# Patient Record
Sex: Female | Born: 1985 | Race: White | Hispanic: No | Marital: Married | Smoking: Never smoker
Health system: Southern US, Community
[De-identification: ages and names within clinical notes are randomized; demographics above are authoritative.]

## PROBLEM LIST (undated history)

## (undated) ENCOUNTER — Inpatient Hospital Stay (HOSPITAL_COMMUNITY): Payer: Self-pay

## (undated) DIAGNOSIS — R519 Headache, unspecified: Secondary | ICD-10-CM

## (undated) DIAGNOSIS — G540 Brachial plexus disorders: Secondary | ICD-10-CM

## (undated) DIAGNOSIS — K200 Eosinophilic esophagitis: Secondary | ICD-10-CM

## (undated) DIAGNOSIS — K219 Gastro-esophageal reflux disease without esophagitis: Secondary | ICD-10-CM

## (undated) DIAGNOSIS — G932 Benign intracranial hypertension: Secondary | ICD-10-CM

## (undated) DIAGNOSIS — Z8669 Personal history of other diseases of the nervous system and sense organs: Secondary | ICD-10-CM

## (undated) DIAGNOSIS — E039 Hypothyroidism, unspecified: Secondary | ICD-10-CM

## (undated) DIAGNOSIS — E559 Vitamin D deficiency, unspecified: Secondary | ICD-10-CM

## (undated) DIAGNOSIS — Z87898 Personal history of other specified conditions: Secondary | ICD-10-CM

## (undated) DIAGNOSIS — E282 Polycystic ovarian syndrome: Secondary | ICD-10-CM

## (undated) DIAGNOSIS — R51 Headache: Secondary | ICD-10-CM

## (undated) HISTORY — DX: Personal history of other diseases of the nervous system and sense organs: Z86.69

## (undated) HISTORY — DX: Eosinophilic esophagitis: K20.0

## (undated) HISTORY — DX: Headache, unspecified: R51.9

## (undated) HISTORY — DX: Gastro-esophageal reflux disease without esophagitis: K21.9

## (undated) HISTORY — DX: Personal history of other specified conditions: Z87.898

## (undated) HISTORY — PX: CARPAL TUNNEL RELEASE: SHX101

## (undated) HISTORY — DX: Vitamin D deficiency, unspecified: E55.9

## (undated) HISTORY — DX: Brachial plexus disorders: G54.0

## (undated) HISTORY — PX: UPPER GASTROINTESTINAL ENDOSCOPY: SHX188

## (undated) HISTORY — DX: Polycystic ovarian syndrome: E28.2

## (undated) HISTORY — DX: Benign intracranial hypertension: G93.2

## (undated) HISTORY — DX: Headache: R51

## (undated) HISTORY — PX: LAPAROSCOPIC GASTRIC SLEEVE RESECTION: SHX5895

## (undated) HISTORY — DX: Hypothyroidism, unspecified: E03.9

---
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
## 2011-12-13 ENCOUNTER — Ambulatory Visit: Payer: Self-pay

## 2012-01-11 ENCOUNTER — Other Ambulatory Visit: Payer: Self-pay | Admitting: Physician Assistant

## 2012-01-11 ENCOUNTER — Emergency Department: Payer: Self-pay | Admitting: Emergency Medicine

## 2012-01-11 LAB — CSF CELL CT + PROT + GLU PANEL
CSF Tube #: 3
Eosinophil: 0 %
Glucose, CSF: 46 mg/dL (ref 40–75)
Monocytes/Macrophages: 50 %
Neutrophils: 0 %
Other Cells: 0 %
Protein, CSF: 36 mg/dL (ref 15–45)

## 2012-01-11 LAB — URINALYSIS, COMPLETE
Bilirubin,UR: NEGATIVE
Blood: NEGATIVE
Glucose,UR: NEGATIVE mg/dL (ref 0–75)
Protein: 30
Squamous Epithelial: 6
WBC UR: 5 /HPF (ref 0–5)

## 2012-01-11 LAB — CBC WITH DIFFERENTIAL/PLATELET
Basophil %: 0.4 %
Eosinophil #: 0.1 10*3/uL (ref 0.0–0.7)
HGB: 12.8 g/dL (ref 12.0–16.0)
Lymphocyte %: 17.3 %
Monocyte #: 0.5 x10 3/mm (ref 0.2–0.9)
Monocyte %: 4.9 %
Platelet: 272 10*3/uL (ref 150–440)
RDW: 14.3 % (ref 11.5–14.5)
WBC: 9.7 10*3/uL (ref 3.6–11.0)

## 2012-01-11 LAB — COMPREHENSIVE METABOLIC PANEL
Albumin: 3.9 g/dL (ref 3.4–5.0)
Alkaline Phosphatase: 88 U/L (ref 50–136)
Bilirubin,Total: 0.8 mg/dL (ref 0.2–1.0)
Chloride: 106 mmol/L (ref 98–107)
Co2: 27 mmol/L (ref 21–32)
Creatinine: 0.83 mg/dL (ref 0.60–1.30)
EGFR (African American): >60
Osmolality: 273 (ref 275–301)
Sodium: 138 mmol/L (ref 136–145)

## 2012-01-11 LAB — TSH: Thyroid Stimulating Horm: 3.03 u[IU]/mL

## 2012-01-11 LAB — MAGNESIUM: Magnesium: 1.7 mg/dL — ABNORMAL LOW

## 2012-01-31 ENCOUNTER — Other Ambulatory Visit: Payer: Self-pay | Admitting: Neurology

## 2012-01-31 LAB — CBC WITH DIFFERENTIAL/PLATELET
Basophil #: 0 10*3/uL (ref 0.0–0.1)
Basophil %: 0.4 %
Lymphocyte #: 1.7 10*3/uL (ref 1.0–3.6)
Lymphocyte %: 23.5 %
MCHC: 32.9 g/dL (ref 32.0–36.0)
MCV: 85 fL (ref 80–100)
Monocyte #: 0.4 x10 3/mm (ref 0.2–0.9)
Neutrophil #: 5.1 10*3/uL (ref 1.4–6.5)
Neutrophil %: 69.2 %
RBC: 4.59 10*6/uL (ref 3.80–5.20)
RDW: 14.9 % — ABNORMAL HIGH (ref 11.5–14.5)

## 2012-01-31 LAB — COMPREHENSIVE METABOLIC PANEL
Albumin: 3.9 g/dL (ref 3.4–5.0)
Anion Gap: 8 (ref 7–16)
BUN: 9 mg/dL (ref 7–18)
Bilirubin,Total: 0.8 mg/dL (ref 0.2–1.0)
Chloride: 114 mmol/L — ABNORMAL HIGH (ref 98–107)
EGFR (African American): >60
EGFR (Non-African Amer.): >60
Glucose: 81 mg/dL (ref 65–99)
Potassium: 3.6 mmol/L (ref 3.5–5.1)
SGOT(AST): 24 U/L (ref 15–37)
Sodium: 140 mmol/L (ref 136–145)

## 2012-01-31 LAB — SEDIMENTATION RATE: Erythrocyte Sed Rate: 19 mm/hr (ref 0–20)

## 2012-02-09 ENCOUNTER — Ambulatory Visit: Payer: Self-pay | Admitting: Neurology

## 2012-02-28 ENCOUNTER — Ambulatory Visit: Payer: Self-pay | Admitting: Neurology

## 2012-03-18 ENCOUNTER — Ambulatory Visit: Payer: Self-pay | Admitting: Neurology

## 2012-03-22 ENCOUNTER — Ambulatory Visit: Payer: Self-pay | Admitting: Neurology

## 2012-05-26 ENCOUNTER — Other Ambulatory Visit: Payer: Self-pay | Admitting: Family Medicine

## 2012-05-26 LAB — BASIC METABOLIC PANEL
Anion Gap: 10 (ref 7–16)
BUN: 10 mg/dL (ref 7–18)
Chloride: 111 mmol/L — ABNORMAL HIGH (ref 98–107)
EGFR (African American): >60
EGFR (Non-African Amer.): >60
Glucose: 90 mg/dL (ref 65–99)
Osmolality: 278 (ref 275–301)
Potassium: 3.8 mmol/L (ref 3.5–5.1)

## 2012-05-26 LAB — HEMOGLOBIN A1C: Hemoglobin A1C: 5.1 % (ref 4.2–6.3)

## 2012-05-26 LAB — TSH: Thyroid Stimulating Horm: 4.05 u[IU]/mL

## 2012-08-25 ENCOUNTER — Ambulatory Visit: Payer: Self-pay | Admitting: Specialist

## 2012-08-25 LAB — CBC WITH DIFFERENTIAL/PLATELET
Basophil #: 0 10*3/uL (ref 0.0–0.1)
Basophil %: 0.6 %
Eosinophil %: 1.2 %
HCT: 37.7 % (ref 35.0–47.0)
HGB: 12.4 g/dL (ref 12.0–16.0)
Lymphocyte %: 26.7 %
MCH: 27.5 pg (ref 26.0–34.0)
MCHC: 33 g/dL (ref 32.0–36.0)
MCV: 83 fL (ref 80–100)
Neutrophil %: 65.7 %
Platelet: 253 10*3/uL (ref 150–440)
RBC: 4.52 10*6/uL (ref 3.80–5.20)

## 2012-08-25 LAB — COMPREHENSIVE METABOLIC PANEL
Alkaline Phosphatase: 79 U/L (ref 50–136)
BUN: 10 mg/dL (ref 7–18)
Bilirubin,Total: 0.5 mg/dL (ref 0.2–1.0)
Calcium, Total: 8.8 mg/dL (ref 8.5–10.1)
Chloride: 115 mmol/L — ABNORMAL HIGH (ref 98–107)
Co2: 21 mmol/L (ref 21–32)
EGFR (African American): >60
EGFR (Non-African Amer.): >60
Osmolality: 278 (ref 275–301)
SGOT(AST): 26 U/L (ref 15–37)
SGPT (ALT): 25 U/L (ref 12–78)
Sodium: 140 mmol/L (ref 136–145)
Total Protein: 7.6 g/dL (ref 6.4–8.2)

## 2012-08-25 LAB — PROTIME-INR
INR: 1
Prothrombin Time: 13.3 secs (ref 11.5–14.7)

## 2012-08-25 LAB — LIPASE, BLOOD: Lipase: 108 U/L (ref 73–393)

## 2012-08-25 LAB — TSH: Thyroid Stimulating Horm: 1.75 u[IU]/mL

## 2012-08-25 LAB — IRON AND TIBC
Iron Bind.Cap.(Total): 327 ug/dL (ref 250–450)
Iron Saturation: 16 %
Iron: 52 ug/dL (ref 50–170)
Unbound Iron-Bind.Cap.: 275 ug/dL

## 2012-08-25 LAB — FOLATE: Folic Acid: 7.9 ng/mL (ref 3.1–100.0)

## 2012-08-25 LAB — MAGNESIUM: Magnesium: 1.7 mg/dL — ABNORMAL LOW

## 2012-09-08 ENCOUNTER — Ambulatory Visit: Payer: Self-pay | Admitting: Specialist

## 2012-09-19 ENCOUNTER — Ambulatory Visit: Payer: Self-pay | Admitting: Specialist

## 2012-09-25 ENCOUNTER — Ambulatory Visit: Payer: Self-pay | Admitting: Internal Medicine

## 2012-09-27 ENCOUNTER — Encounter: Payer: Self-pay | Admitting: Internal Medicine

## 2012-09-27 ENCOUNTER — Ambulatory Visit (INDEPENDENT_AMBULATORY_CARE_PROVIDER_SITE_OTHER): Payer: 59 | Admitting: Internal Medicine

## 2012-09-27 VITALS — BP 110/70 | HR 61 | Temp 98.0°F | Ht 66.0 in | Wt 296.5 lb

## 2012-09-27 DIAGNOSIS — E039 Hypothyroidism, unspecified: Secondary | ICD-10-CM

## 2012-09-27 DIAGNOSIS — K219 Gastro-esophageal reflux disease without esophagitis: Secondary | ICD-10-CM

## 2012-09-27 DIAGNOSIS — E282 Polycystic ovarian syndrome: Secondary | ICD-10-CM

## 2012-09-27 DIAGNOSIS — G43909 Migraine, unspecified, not intractable, without status migrainosus: Secondary | ICD-10-CM

## 2012-09-27 DIAGNOSIS — G932 Benign intracranial hypertension: Secondary | ICD-10-CM

## 2012-09-30 ENCOUNTER — Encounter: Payer: Self-pay | Admitting: Internal Medicine

## 2012-09-30 DIAGNOSIS — E039 Hypothyroidism, unspecified: Secondary | ICD-10-CM | POA: Insufficient documentation

## 2012-09-30 DIAGNOSIS — K219 Gastro-esophageal reflux disease without esophagitis: Secondary | ICD-10-CM | POA: Insufficient documentation

## 2012-09-30 DIAGNOSIS — E282 Polycystic ovarian syndrome: Secondary | ICD-10-CM | POA: Insufficient documentation

## 2012-09-30 DIAGNOSIS — G43909 Migraine, unspecified, not intractable, without status migrainosus: Secondary | ICD-10-CM | POA: Insufficient documentation

## 2012-09-30 DIAGNOSIS — G932 Benign intracranial hypertension: Secondary | ICD-10-CM | POA: Insufficient documentation

## 2012-09-30 NOTE — Assessment & Plan Note (Signed)
Stable.  Follow.   

## 2012-09-30 NOTE — Progress Notes (Signed)
Subjective:    Patient ID: Anna Vasquez, female    DOB: 1985/08/20, 27 y.o.   MRN: [id number]  HPI 27 year old female with past history of pseudotumor cerebri followed by neruology, GERD, frequent headaches/migraines and hypothyroidism who comes in today to follow up on these issues as well as to establish care.  She states her headaches are better now.  May have 1-2/month.  She has not tolerated preventative meds in the past.  Will have visual auras and light sensitivity.  Pain localized on the top of her head. Takes Excedrin Migraine and this works for her.  States she has had migraines since she was very young.  Family history of migraines.  Was diagnosed with pseudotumor cerebri in 2009.  At that time was having tunnel vision and floaters.  Also had headache and emesis.  Was placed on Diamox at that time.  Was off for a while and did not f/u with neurology.  Now she is back seeing neurology and back on Diamox.  She does not tolerate the Diamox.  Wants to be off the medication.  She has discussed this with her neurology.  Desires weight loss.  Feels she may be able to get off the medication if she can lose weight.  Is seeing Dr Smitty Cords.  Planning for gastric sleeve - end of 8/14.  Has had EKG, labs, CXR, ultrasound, and CT (head and neck).  Planning for UGI.  Has her last nutrition visit 10/23/12.  Has her psych consultation 7/23.    She also has inconsistent periods.  Was diagnosed with PCOD.  Last pap 2012.  All have been normal.  Periods may be very heavy for two days and last for 4-5 days.  Has some acid reflux.  Not taking any medication regularly now for this.  She moved here from [place].  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     Past Medical History  Diagnosis Date  . GERD (gastroesophageal reflux disease)   . Frequent headaches   . Hypothyroidism   . H/O febrile seizure   . Hx of migraines   . Pseudotumor cerebri   . PCOD (polycystic ovarian disease)     Outpatient Encounter Prescriptions as of  09/27/2012  Medication Sig Dispense Refill  . acetaminophen (TYLENOL) 325 MG tablet Take 650 mg by mouth every 6 (six) hours as needed for pain.      Marland Kitchen acetaZOLAMIDE (DIAMOX) 500 MG capsule Take 500 mg by mouth 2 (two) times daily.      Marland Kitchen aspirin-acetaminophen-caffeine (EXCEDRIN MIGRAINE) 250-250-65 MG per tablet Take 1 tablet by mouth every 6 (six) hours as needed for pain.      . B Complex Vitamins (B COMPLEX-B12 PO) Take by mouth daily.      . Cholecalciferol (VITAMIN D3) 5000 UNITS CAPS Take by mouth daily.      . IRON, FERROUS GLUCONATE, PO Take 60 mg by mouth daily.      Marland Kitchen levothyroxine (SYNTHROID, LEVOTHROID) 112 MCG tablet Take 112 mcg by mouth daily before breakfast.      . magnesium gluconate (MAGONATE) 500 MG tablet Take 500 mg by mouth daily.       No facility-administered encounter medications on file as of 09/27/2012.    Review of Systems Has issues with headaches as outlined.  Currently doing better.  Excedrin Migraine works.  No chest pain, tightness or palpitations.  No increased shortness of breath, cough or congestion.  No nausea or vomiting.  Some acid reflux as outlined.  No abdominal pain or cramping.  No bowel change, such as diarrhea, constipation, BRBPR or melana.  No urine change.  Periods as outlined.  Desire for weight loss as outlined.        Objective:   Physical Exam Filed Vitals:   09/27/12 1333  BP: 110/70  Pulse: 61  Temp: 98 F (8.81 C)   27 year old female in no acute distress.   HEENT:  Nares- clear.  Oropharynx - without lesions. NECK:  Supple.  Nontender.  No audible bruit.  HEART:  Appears to be regular. LUNGS:  No crackles or wheezing audible.  Respirations even and unlabored.  RADIAL PULSE:  Equal bilaterally. ABDOMEN:  Soft, nontender.  Bowel sounds present and normal.  No audible abdominal bruit.  EXTREMITIES:  No increased edema present.  DP pulses palpable and equal bilaterally.      SKIN:  No rash.       Assessment & Plan:  HEALTH  MAINTENANCE.  Schedule a physical next visit.  Obtain outside records.    I spent 45 minutes with the patient and more than 50% of the time was spent in consultation regarding the above.

## 2012-09-30 NOTE — Assessment & Plan Note (Signed)
Sees neurology.  Currently stable.  Has not done well with preventative medications.  Excedrin Migraine works well.

## 2012-09-30 NOTE — Assessment & Plan Note (Signed)
On thyroid placement.  Follow tsh.   

## 2012-09-30 NOTE — Assessment & Plan Note (Signed)
Currently on Diamox.  Doing better.  Headaches better.  Continues to follow up with neurology.

## 2012-09-30 NOTE — Assessment & Plan Note (Signed)
Has reflux symptoms.  Start Zantac 150mg  q day.  Follow.  Will notify me if persistent symptoms.  Schedule a follow up soon to reassess.

## 2012-10-16 ENCOUNTER — Encounter: Payer: Self-pay | Admitting: Internal Medicine

## 2012-10-20 ENCOUNTER — Ambulatory Visit: Payer: Self-pay | Admitting: Specialist

## 2012-10-22 ENCOUNTER — Encounter: Payer: Self-pay | Admitting: Internal Medicine

## 2012-10-22 DIAGNOSIS — K219 Gastro-esophageal reflux disease without esophagitis: Secondary | ICD-10-CM

## 2012-10-22 DIAGNOSIS — E559 Vitamin D deficiency, unspecified: Secondary | ICD-10-CM

## 2012-10-24 ENCOUNTER — Ambulatory Visit: Payer: Self-pay | Admitting: Gastroenterology

## 2012-10-25 LAB — PATHOLOGY REPORT

## 2012-11-06 ENCOUNTER — Encounter: Payer: Self-pay | Admitting: Internal Medicine

## 2012-11-20 ENCOUNTER — Ambulatory Visit: Payer: Self-pay | Admitting: Specialist

## 2012-11-27 ENCOUNTER — Ambulatory Visit: Payer: Self-pay | Admitting: Specialist

## 2012-11-28 ENCOUNTER — Ambulatory Visit: Payer: 59 | Admitting: Internal Medicine

## 2012-12-04 ENCOUNTER — Inpatient Hospital Stay: Payer: Self-pay | Admitting: Specialist

## 2012-12-05 LAB — BASIC METABOLIC PANEL
Anion Gap: 6 — ABNORMAL LOW (ref 7–16)
BUN: 4 mg/dL — ABNORMAL LOW (ref 7–18)
Co2: 20 mmol/L — ABNORMAL LOW (ref 21–32)
Creatinine: 0.79 mg/dL (ref 0.60–1.30)
EGFR (African American): >60
EGFR (Non-African Amer.): >60
Glucose: 124 mg/dL — ABNORMAL HIGH (ref 65–99)
Osmolality: 270 (ref 275–301)
Potassium: 4.4 mmol/L (ref 3.5–5.1)
Sodium: 136 mmol/L (ref 136–145)

## 2012-12-05 LAB — CBC WITH DIFFERENTIAL/PLATELET
Eosinophil #: 0 10*3/uL (ref 0.0–0.7)
Eosinophil %: 0 %
HGB: 11.7 g/dL — ABNORMAL LOW (ref 12.0–16.0)
Lymphocyte #: 0.5 10*3/uL — ABNORMAL LOW (ref 1.0–3.6)
Monocyte %: 2.4 %
Neutrophil %: 93.3 %
Platelet: 258 10*3/uL (ref 150–440)
RBC: 4.2 10*6/uL (ref 3.80–5.20)
RDW: 14.8 % — ABNORMAL HIGH (ref 11.5–14.5)

## 2012-12-06 LAB — CBC WITH DIFFERENTIAL/PLATELET
Basophil #: 0 10*3/uL (ref 0.0–0.1)
HGB: 10.8 g/dL — ABNORMAL LOW (ref 12.0–16.0)
Lymphocyte #: 1.5 10*3/uL (ref 1.0–3.6)
Lymphocyte %: 12.7 %
MCH: 27.2 pg (ref 26.0–34.0)
MCHC: 32.3 g/dL (ref 32.0–36.0)
Monocyte #: 0.6 x10 3/mm (ref 0.2–0.9)
Monocyte %: 5.3 %
Neutrophil %: 81.9 %
RBC: 3.96 10*6/uL (ref 3.80–5.20)

## 2012-12-06 LAB — BASIC METABOLIC PANEL
Anion Gap: 10 (ref 7–16)
Co2: 17 mmol/L — ABNORMAL LOW (ref 21–32)
Creatinine: 0.64 mg/dL (ref 0.60–1.30)
EGFR (African American): >60
Osmolality: 276 (ref 275–301)
Sodium: 140 mmol/L (ref 136–145)

## 2012-12-06 LAB — ALBUMIN: Albumin: 3 g/dL — ABNORMAL LOW (ref 3.4–5.0)

## 2012-12-06 LAB — PHOSPHORUS: Phosphorus: 1.5 mg/dL — ABNORMAL LOW (ref 2.5–4.9)

## 2012-12-06 LAB — PATHOLOGY REPORT

## 2012-12-14 ENCOUNTER — Encounter: Payer: Self-pay | Admitting: Internal Medicine

## 2012-12-14 ENCOUNTER — Ambulatory Visit (INDEPENDENT_AMBULATORY_CARE_PROVIDER_SITE_OTHER): Payer: 59 | Admitting: Internal Medicine

## 2012-12-14 VITALS — BP 110/80 | HR 99 | Temp 97.8°F | Ht 66.0 in | Wt 268.5 lb

## 2012-12-14 DIAGNOSIS — G932 Benign intracranial hypertension: Secondary | ICD-10-CM

## 2012-12-14 DIAGNOSIS — E282 Polycystic ovarian syndrome: Secondary | ICD-10-CM

## 2012-12-14 DIAGNOSIS — G43909 Migraine, unspecified, not intractable, without status migrainosus: Secondary | ICD-10-CM

## 2012-12-14 DIAGNOSIS — E559 Vitamin D deficiency, unspecified: Secondary | ICD-10-CM

## 2012-12-14 DIAGNOSIS — E039 Hypothyroidism, unspecified: Secondary | ICD-10-CM

## 2012-12-14 DIAGNOSIS — K219 Gastro-esophageal reflux disease without esophagitis: Secondary | ICD-10-CM

## 2012-12-17 ENCOUNTER — Encounter: Payer: Self-pay | Admitting: Internal Medicine

## 2012-12-17 NOTE — Assessment & Plan Note (Signed)
Continue vitamin D supplements.  

## 2012-12-17 NOTE — Assessment & Plan Note (Signed)
On thyroid placement.  Follow tsh.   

## 2012-12-17 NOTE — Assessment & Plan Note (Signed)
Currently on Diamox.  Doing better.  Headaches better.  Continues to follow up with neurology.   

## 2012-12-17 NOTE — Assessment & Plan Note (Signed)
Sees neurology.  Currently stable.  Has not done well with preventative medications.  Headaches better.

## 2012-12-17 NOTE — Assessment & Plan Note (Signed)
Controlled on omeprazole.  Follow.  

## 2012-12-17 NOTE — Progress Notes (Signed)
  Subjective:    Patient ID: Anna Vasquez, female    DOB: 1985-12-31, 27 y.o.   MRN: [id number]  HPI 27 year old female with past history of pseudotumor cerebri followed by neruology, GERD, frequent headaches/migraines and hypothyroidism who comes in today for a scheduled follow up.  She states her headaches are better now.  Was diagnosed with pseudotumor cerebri in 2009.  On Diamox seeing neurology.  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  Is seeing Dr Smitty Cords.  Doing well s/p her procedure.  Staying hydrated.  Has f/u 10/7.  She is walking.  Feels better.  No headaches now.  Acid reflux controlled on omeprazole.      Past Medical History  Diagnosis Date  . GERD (gastroesophageal reflux disease)   . Frequent headaches   . Hypothyroidism   . H/O febrile seizure   . Hx of migraines   . Pseudotumor cerebri   . PCOD (polycystic ovarian disease)   . Hypothyroidism   . Thoracic outlet syndrome     extra cervical ribs  . Vitamin D deficiency     Outpatient Encounter Prescriptions as of 12/14/2012  Medication Sig Dispense Refill  . acetaminophen (TYLENOL) 325 MG tablet Take 650 mg by mouth every 6 (six) hours as needed for pain.      Marland Kitchen acetaZOLAMIDE (DIAMOX) 500 MG capsule Take 500 mg by mouth 2 (two) times daily.      Marland Kitchen levothyroxine (SYNTHROID, LEVOTHROID) 112 MCG tablet Take 112 mcg by mouth daily before breakfast.      . omeprazole (PRILOSEC OTC) 20 MG tablet Take 20 mg by mouth daily.      . B Complex Vitamins (B COMPLEX-B12 PO) Take by mouth daily.      . Cholecalciferol (VITAMIN D3) 5000 UNITS CAPS Take by mouth daily.      . IRON, FERROUS GLUCONATE, PO Take 60 mg by mouth daily.      . magnesium gluconate (MAGONATE) 500 MG tablet Take 500 mg by mouth daily.      . [DISCONTINUED] aspirin-acetaminophen-caffeine (EXCEDRIN MIGRAINE) 250-250-65 MG per tablet Take 1 tablet by mouth every 6 (six) hours as needed for pain.       No facility-administered encounter medications on file as of  12/14/2012.    Review of Systems Headaches better.  No lightheadedness or dizziness.  No chest pain or tightness.  No sob.  Breathing doing well.  No nausea or vomiting.  Acid reflux controlled.  On omeprazole.  No abdominal pain or cramping.  No bowel change.  No urinary symptoms.      Objective:   Physical Exam  Filed Vitals:   12/14/12 0932  BP: 110/80  Pulse: 99  Temp: 97.8 F (36.6 C)   Pulse 28  27 year old female in no acute distress.   HEENT:  Nares- clear.  Oropharynx - without lesions. NECK:  Supple.  Nontender.  No audible bruit.  HEART:  Appears to be regular. LUNGS:  No crackles or wheezing audible.  Respirations even and unlabored.  RADIAL PULSE:  Equal bilaterally. ABDOMEN:  Soft, nontender.  Bowel sounds present and normal.  No audible abdominal bruit.  EXTREMITIES:  No increased edema present.  DP pulses palpable and equal bilaterally.        Assessment & Plan:  HEALTH MAINTENANCE.  Schedule a physical next visit.

## 2012-12-17 NOTE — Assessment & Plan Note (Signed)
Stable.  Follow.   

## 2012-12-28 ENCOUNTER — Ambulatory Visit: Payer: Self-pay | Admitting: Specialist

## 2013-01-01 ENCOUNTER — Telehealth: Payer: Self-pay | Admitting: Internal Medicine

## 2013-01-01 NOTE — Telephone Encounter (Signed)
Synthroid 112 mcg.  Patient canNOT take generic, has to be Synthroid.  Pt calling for refill.  States bottle says no refills.  Says was previously prescribed by another provider.  [facility].

## 2013-01-02 ENCOUNTER — Other Ambulatory Visit: Payer: Self-pay | Admitting: *Deleted

## 2013-01-02 MED ORDER — LEVOTHYROXINE SODIUM 112 MCG PO TABS: 112.0000 ug | ORAL_TABLET | Freq: Every day | ORAL | Status: DC

## 2013-01-02 NOTE — Telephone Encounter (Signed)
Sent electronically 

## 2013-01-10 ENCOUNTER — Telehealth: Payer: Self-pay | Admitting: Internal Medicine

## 2013-01-10 DIAGNOSIS — L989 Disorder of the skin and subcutaneous tissue, unspecified: Secondary | ICD-10-CM

## 2013-01-10 NOTE — Telephone Encounter (Signed)
Order placed for dermatology referral.  

## 2013-01-10 NOTE — Telephone Encounter (Signed)
Bump on face that will not clear up, dark, discolored spot that has never surfaced x almost 2 months.  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  Would like to see if she could get a referral to Dermatology to have this spot checked out.  Had an appt with Dr. Lorin Picket recently (9/25) and forgot to mention this.

## 2013-01-20 ENCOUNTER — Ambulatory Visit: Payer: Self-pay | Admitting: Specialist

## 2013-03-01 IMAGING — CT CT HEAD WITHOUT CONTRAST
2 series · 16 of 30 positions shown, 20 images · non-contrast
Comparison: none

REASON FOR EXAM: intracranial htn, headache, blurry vision.
COMMENTS:

PROCEDURE:     CT  - CT HEAD WITHOUT CONTRAST  - January 11, 2012  [DATE]
RESULT:     Comparison:  None
TECHNIQUE: Multiple axial images from the foramen magnum to the vertex were
obtained without IV contrast.

[Series 2: without · axial · non-contrast · 0.41mm/px · z∈[-86,+34]mm · 13 of 28 slices shown, 17 images]
[im 2/28  brain]
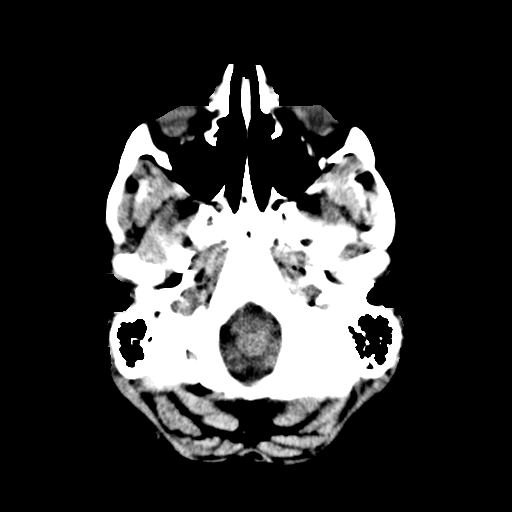
[im 2/28  bone]
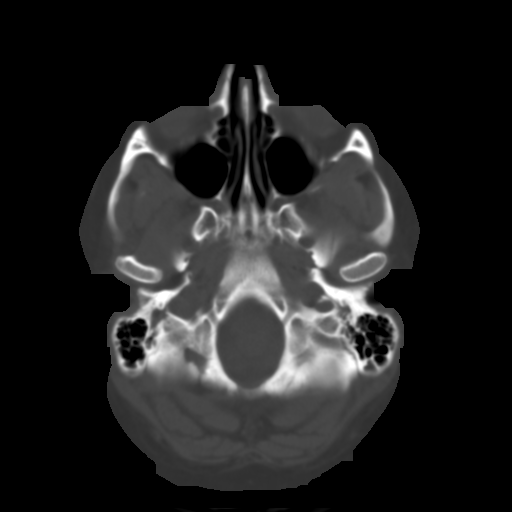
[im 4/28  brain]
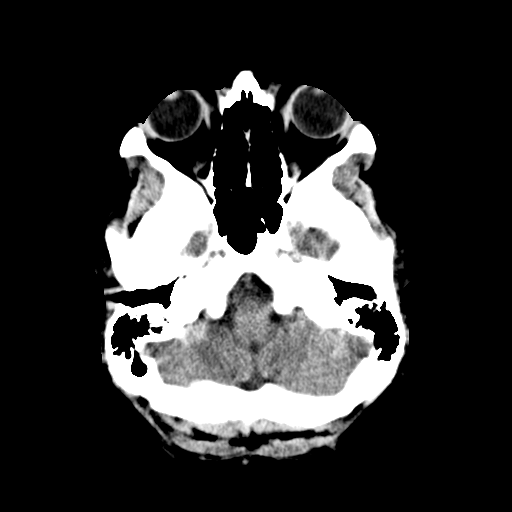
[im 6/28  brain]
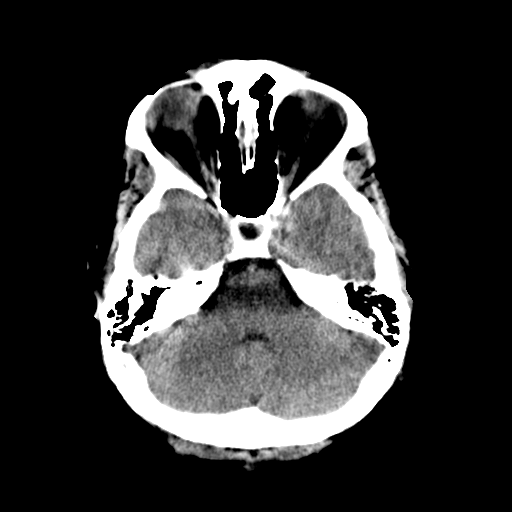
[im 8/28  brain]
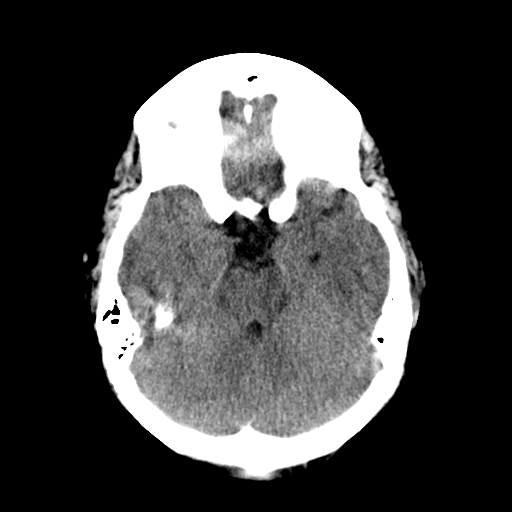
[im 10/28  brain]
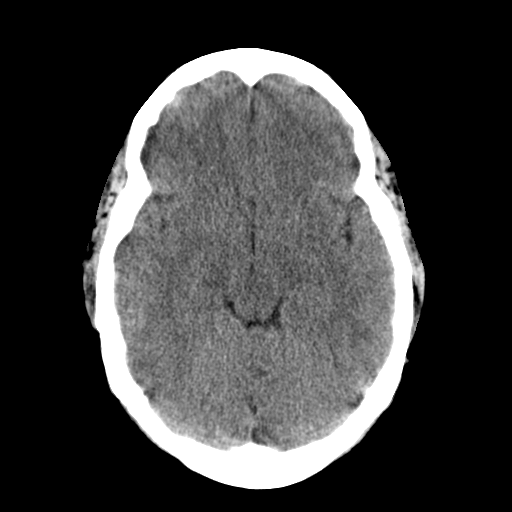
[im 10/28  bone]
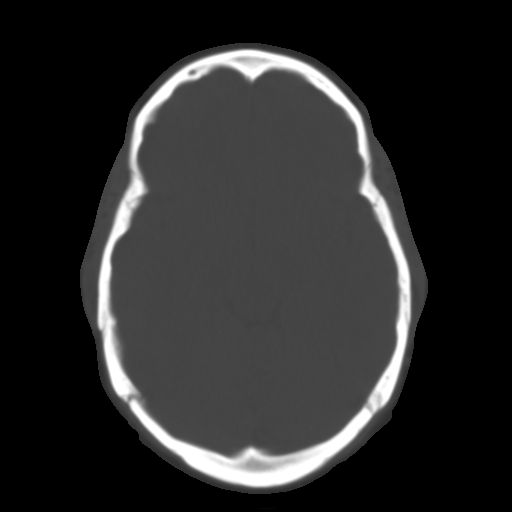
[im 12/28  brain]
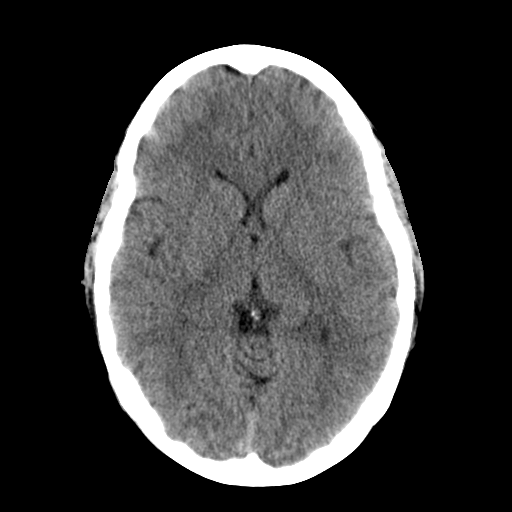
[im 14/28  brain]
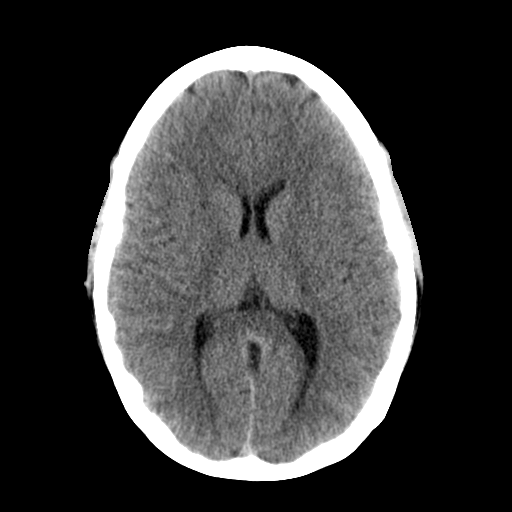
[im 16/28  brain]
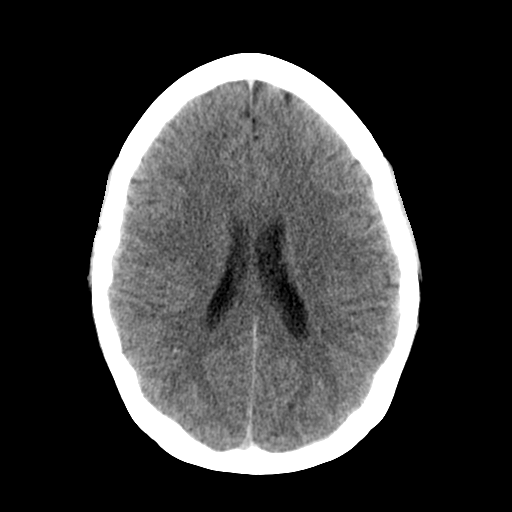
[im 18/28  brain]
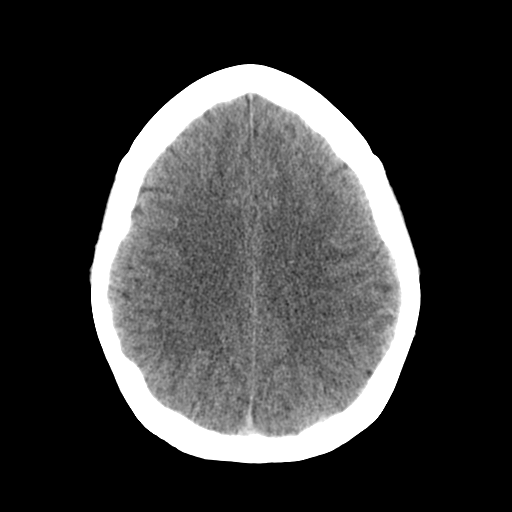
[im 18/28  bone]
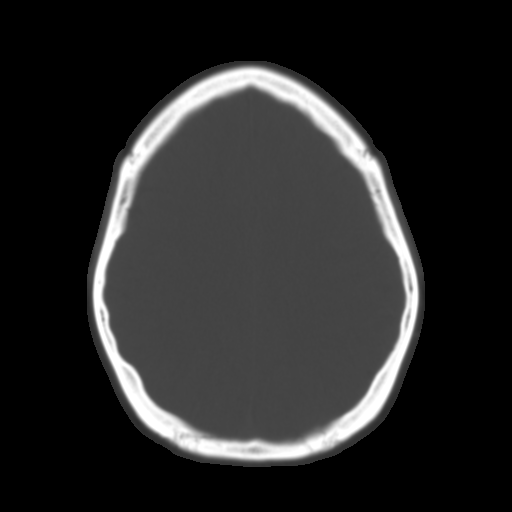
[im 20/28  brain]
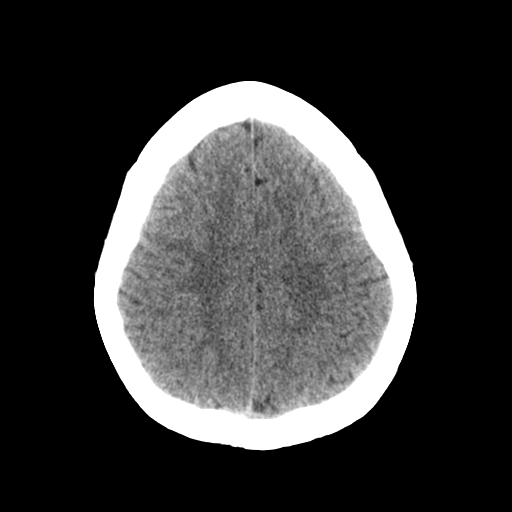
[im 22/28  brain]
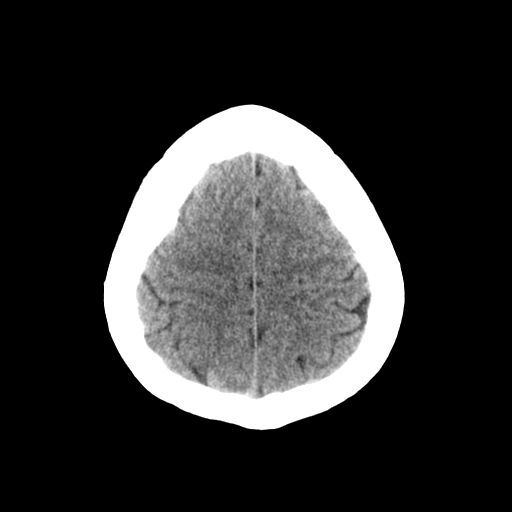
[im 24/28  brain]
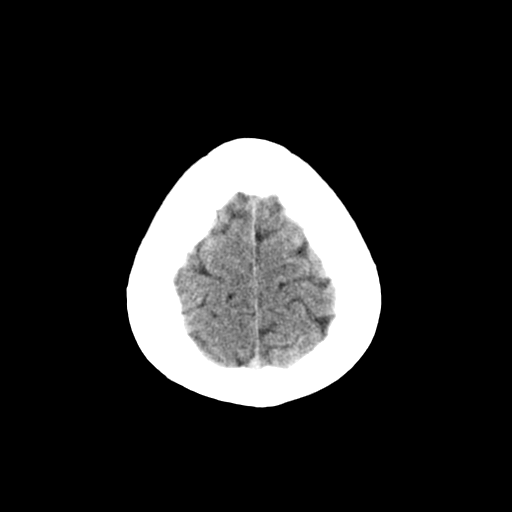
[im 26/28  brain]
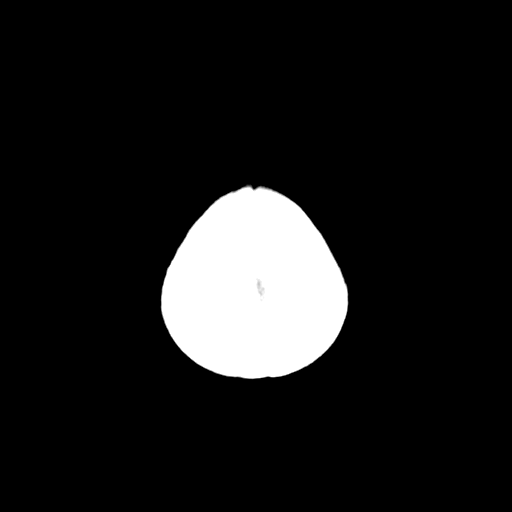
[im 26/28  bone]
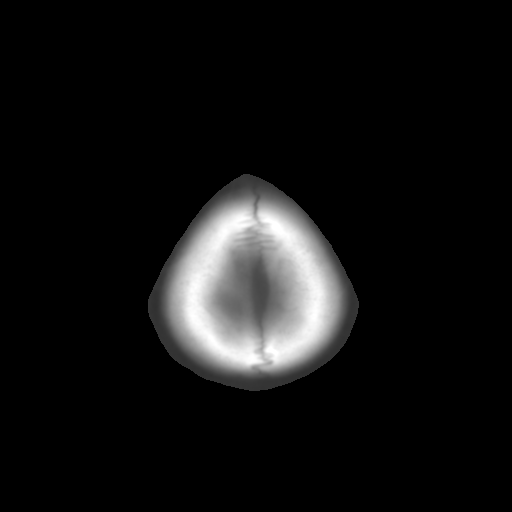

[Series 3: bone · axial · 0.41mm/px · z∈[-86,-46]mm · 3 of 28 slices shown]
[im 2/28  bone]
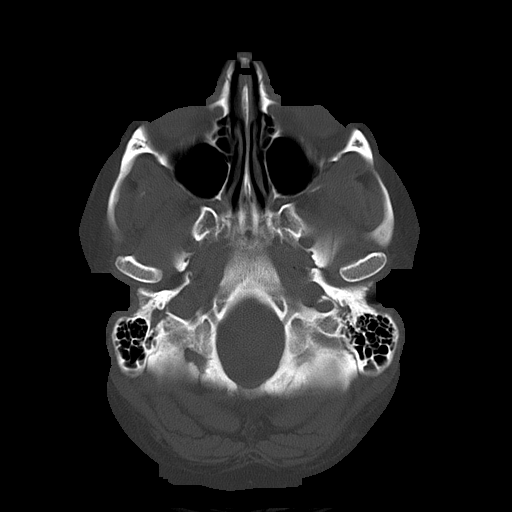
[im 6/28  bone]
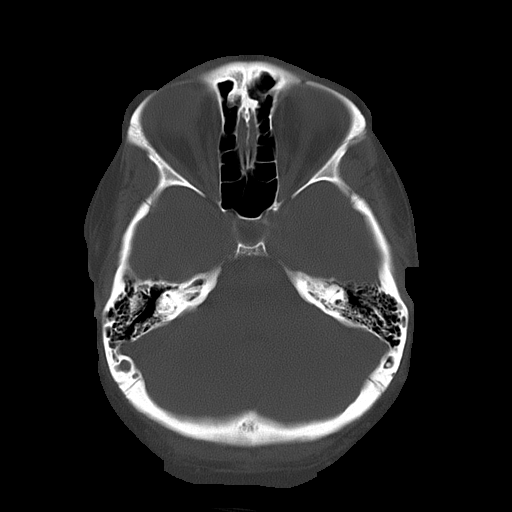
[im 10/28  bone]
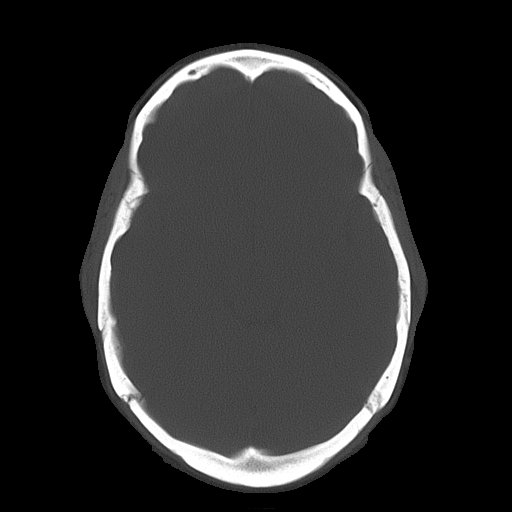

[16 of 30 positions shown; findings below may reference images not displayed]

FINDINGS: There is no evidence of mass effect, midline shift, or extra-axial fluid
collections.  There is no evidence of a space-occupying lesion or
intracranial hemorrhage. There is no evidence of a cortical-based area of
acute infarction.

The ventricles and sulci are appropriate for the patient's age. The basal
cisterns are patent.

Visualized portions of the orbits are unremarkable. The visualized portions
of the paranasal sinuses and mastoid air cells are unremarkable.

The osseous structures are unremarkable.
IMPRESSION: No acute intracranial process.

[REDACTED]

## 2013-03-09 ENCOUNTER — Other Ambulatory Visit (HOSPITAL_COMMUNITY)
Admission: RE | Admit: 2013-03-09 | Discharge: 2013-03-09 | Disposition: A | Discharge status: Home / Self Care | Payer: 59 | Source: Ambulatory Visit | Attending: Internal Medicine | Admitting: Internal Medicine

## 2013-03-09 ENCOUNTER — Encounter: Payer: Self-pay | Admitting: Internal Medicine

## 2013-03-09 ENCOUNTER — Ambulatory Visit (INDEPENDENT_AMBULATORY_CARE_PROVIDER_SITE_OTHER): Payer: 59 | Admitting: Internal Medicine

## 2013-03-09 VITALS — BP 110/70 | HR 60 | Temp 98.2°F | Ht 66.0 in | Wt 248.0 lb

## 2013-03-09 DIAGNOSIS — Z01419 Encounter for gynecological examination (general) (routine) without abnormal findings: Secondary | ICD-10-CM | POA: Insufficient documentation

## 2013-03-09 DIAGNOSIS — M25551 Pain in right hip: Secondary | ICD-10-CM

## 2013-03-09 DIAGNOSIS — K219 Gastro-esophageal reflux disease without esophagitis: Secondary | ICD-10-CM

## 2013-03-09 DIAGNOSIS — E039 Hypothyroidism, unspecified: Secondary | ICD-10-CM

## 2013-03-09 DIAGNOSIS — E282 Polycystic ovarian syndrome: Secondary | ICD-10-CM

## 2013-03-09 DIAGNOSIS — G43909 Migraine, unspecified, not intractable, without status migrainosus: Secondary | ICD-10-CM

## 2013-03-09 DIAGNOSIS — G932 Benign intracranial hypertension: Secondary | ICD-10-CM

## 2013-03-09 DIAGNOSIS — Z13220 Encounter for screening for lipoid disorders: Secondary | ICD-10-CM

## 2013-03-09 DIAGNOSIS — M25559 Pain in unspecified hip: Secondary | ICD-10-CM

## 2013-03-09 DIAGNOSIS — Z124 Encounter for screening for malignant neoplasm of cervix: Secondary | ICD-10-CM

## 2013-03-09 DIAGNOSIS — E559 Vitamin D deficiency, unspecified: Secondary | ICD-10-CM

## 2013-03-09 NOTE — Progress Notes (Signed)
Pre-visit discussion using our clinic review tool. No additional management support is needed unless otherwise documented below in the visit note.  

## 2013-03-11 ENCOUNTER — Encounter: Payer: Self-pay | Admitting: Internal Medicine

## 2013-03-11 DIAGNOSIS — M25551 Pain in right hip: Secondary | ICD-10-CM | POA: Insufficient documentation

## 2013-03-11 NOTE — Assessment & Plan Note (Signed)
On thyroid placement.  Follow tsh.   

## 2013-03-11 NOTE — Assessment & Plan Note (Signed)
Persistent pain.  Check xray.  Further w/up pending results.  

## 2013-03-11 NOTE — Assessment & Plan Note (Signed)
Doing better.  Headaches better.  Continues to follow up with neurology.  Off diamox.

## 2013-03-11 NOTE — Assessment & Plan Note (Signed)
Stable.  Follow.   

## 2013-03-11 NOTE — Assessment & Plan Note (Signed)
Sees neurology.  Currently stable.  Headaches better.

## 2013-03-11 NOTE — Assessment & Plan Note (Signed)
Continue vitamin D supplements.  

## 2013-03-11 NOTE — Progress Notes (Signed)
Subjective:    Patient ID: Anna Vasquez, female    DOB: [birth year], 27 y.o.   MRN: [id number]  HPI 27 year old female with past history of pseudotumor cerebri followed by neruology, GERD, frequent headaches/migraines and hypothyroidism who comes in today to follow up on these issues as well as for a complete physical exam.  She states her headaches are better now.  Was diagnosed with pseudotumor cerebri in 2009.  Off Diamox.  Seeing neurology. 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  Is seeing Dr Smitty Cords.  Doing well s/p her procedure.  Staying hydrated.  She is walking.  Feels better.  No headaches now.  Acid reflux controlled relatively well on omeprazole.  Still some occasional issues.  Some hip pain.  Right > left.  Persistent pain.  No injury.       Past Medical History  Diagnosis Date  . GERD (gastroesophageal reflux disease)   . Frequent headaches   . Hypothyroidism   . H/O febrile seizure   . Hx of migraines   . Pseudotumor cerebri   . PCOD (polycystic ovarian disease)   . Hypothyroidism   . Thoracic outlet syndrome     extra cervical ribs  . Vitamin D deficiency     Outpatient Encounter Prescriptions as of 03/09/2013  Medication Sig  . acetaminophen (TYLENOL) 325 MG tablet Take 650 mg by mouth every 6 (six) hours as needed for pain.  . B Complex Vitamins (B COMPLEX-B12 PO) Take by mouth daily.  . Cholecalciferol (VITAMIN D3) 5000 UNITS CAPS Take by mouth daily.  Marland Kitchen levothyroxine (SYNTHROID, LEVOTHROID) 112 MCG tablet Take 1 tablet (112 mcg total) by mouth daily before breakfast.  . magnesium gluconate (MAGONATE) 500 MG tablet Take 500 mg by mouth daily.  . Multiple Vitamin (MULTIVITAMIN) tablet Take 1 tablet by mouth daily.  Marland Kitchen omeprazole (PRILOSEC OTC) 20 MG tablet Take 20 mg by mouth daily.  . [DISCONTINUED] acetaZOLAMIDE (DIAMOX) 500 MG capsule Take 500 mg by mouth 2 (two) times daily.  . [DISCONTINUED] IRON, FERROUS GLUCONATE, PO Take 60 mg by mouth daily.    Review of  Systems Headaches better.  Off diamox.  No lightheadedness or dizziness.  No chest pain or tightness.  No sob.  Breathing doing well.  No nausea or vomiting.  Acid reflux controlled - on omeprazole.  No abdominal pain or cramping.  No bowel change.  No urinary symptoms.  Right hip pain greater than left.       Objective:   Physical Exam  Filed Vitals:   03/09/13 1615  BP: 110/70  Pulse: 60  Temp: 98.2 F (36.8 C)   Pulse 8  27 year old female in no acute distress.   HEENT:  Nares- clear.  Oropharynx - without lesions. NECK:  Supple.  Nontender.  No audible bruit.  HEART:  Appears to be regular. LUNGS:  No crackles or wheezing audible.  Respirations even and unlabored.  RADIAL PULSE:  Equal bilaterally.    BREASTS:  No nipple discharge or nipple retraction present.  Could not appreciate any distinct nodules or axillary adenopathy.  ABDOMEN:  Soft, nontender.  Bowel sounds present and normal.  No audible abdominal bruit.  GU:  Normal external genitalia.  Vaginal vault without lesions.  Cervix identified.  Pap performed. Could not appreciate any adnexal masses or tenderness.    EXTREMITIES:  No increased edema present.  DP pulses palpable and equal bilaterally.          Assessment & Plan:  HEALTH MAINTENANCE.  Physical today.  Check routine labs.

## 2013-03-11 NOTE — Assessment & Plan Note (Signed)
Controlled on omeprazole.  Follow.  

## 2013-03-13 ENCOUNTER — Other Ambulatory Visit (INDEPENDENT_AMBULATORY_CARE_PROVIDER_SITE_OTHER): Payer: 59

## 2013-03-13 ENCOUNTER — Ambulatory Visit: Payer: Self-pay | Admitting: Internal Medicine

## 2013-03-13 DIAGNOSIS — G932 Benign intracranial hypertension: Secondary | ICD-10-CM

## 2013-03-13 DIAGNOSIS — Z13220 Encounter for screening for lipoid disorders: Secondary | ICD-10-CM

## 2013-03-13 DIAGNOSIS — E039 Hypothyroidism, unspecified: Secondary | ICD-10-CM

## 2013-03-13 LAB — LIPID PANEL
Cholesterol: 144 mg/dL (ref 0–200)
HDL: 48.5 mg/dL (ref >39.00)
LDL Cholesterol: 80 mg/dL (ref 0–99)
Triglycerides: 77 mg/dL (ref 0.0–149.0)
VLDL: 15.4 mg/dL (ref 0.0–40.0)

## 2013-03-13 LAB — CBC WITH DIFFERENTIAL/PLATELET
Basophils Relative: 0.5 % (ref 0.0–3.0)
Eosinophils Absolute: 0.1 10*3/uL (ref 0.0–0.7)
Eosinophils Relative: 2.3 % (ref 0.0–5.0)
HCT: 37.8 % (ref 36.0–46.0)
Lymphs Abs: 1.3 10*3/uL (ref 0.7–4.0)
MCHC: 32.4 g/dL (ref 30.0–36.0)
MCV: 84.9 fl (ref 78.0–100.0)
Monocytes Absolute: 0.6 10*3/uL (ref 0.1–1.0)
Monocytes Relative: 9.6 % (ref 3.0–12.0)
Neutro Abs: 3.9 10*3/uL (ref 1.4–7.7)
Neutrophils Relative %: 65.5 % (ref 43.0–77.0)
RBC: 4.45 Mil/uL (ref 3.87–5.11)
WBC: 6 10*3/uL (ref 4.5–10.5)

## 2013-03-13 LAB — COMPREHENSIVE METABOLIC PANEL
AST: 22 U/L (ref 0–37)
Alkaline Phosphatase: 52 U/L (ref 39–117)
BUN: 10 mg/dL (ref 6–23)
CO2: 28 mEq/L (ref 19–32)
Creatinine, Ser: 0.7 mg/dL (ref 0.4–1.2)
GFR: 105.73 mL/min (ref >60.00)
Potassium: 4.3 mEq/L (ref 3.5–5.1)
Sodium: 139 mEq/L (ref 135–145)
Total Bilirubin: 0.8 mg/dL (ref 0.3–1.2)
Total Protein: 7.2 g/dL (ref 6.0–8.3)

## 2013-03-13 LAB — TSH: TSH: 2.49 u[IU]/mL (ref 0.35–5.50)

## 2013-03-14 ENCOUNTER — Encounter: Payer: Self-pay | Admitting: Internal Medicine

## 2013-03-18 ENCOUNTER — Telehealth: Payer: Self-pay | Admitting: Internal Medicine

## 2013-03-18 DIAGNOSIS — M25551 Pain in right hip: Secondary | ICD-10-CM

## 2013-03-18 NOTE — Telephone Encounter (Signed)
Pt notified of hip xray results.  Recommended referral to physical therapy.  She is to let me know.

## 2013-03-20 ENCOUNTER — Encounter: Payer: Self-pay | Admitting: Internal Medicine

## 2013-03-21 NOTE — Addendum Note (Signed)
Addended by: Charm Barges on: 03/21/2013 03:00 PM   Modules accepted: Orders

## 2013-03-21 NOTE — Telephone Encounter (Signed)
Order placed for referral.  

## 2013-04-09 ENCOUNTER — Encounter: Payer: Self-pay | Admitting: Internal Medicine

## 2013-04-13 ENCOUNTER — Encounter: Payer: Self-pay | Admitting: Internal Medicine

## 2013-04-22 ENCOUNTER — Encounter: Payer: Self-pay | Admitting: Internal Medicine

## 2013-04-23 ENCOUNTER — Other Ambulatory Visit: Payer: Self-pay | Admitting: Specialist

## 2013-04-23 LAB — CBC WITH DIFFERENTIAL/PLATELET
BASOS ABS: 0 10*3/uL (ref 0.0–0.1)
BASOS PCT: 0.5 %
Eosinophil #: 0.1 10*3/uL (ref 0.0–0.7)
Eosinophil %: 1.4 %
HCT: 38.6 % (ref 35.0–47.0)
HGB: 12.3 g/dL (ref 12.0–16.0)
Lymphocyte #: 2.1 10*3/uL (ref 1.0–3.6)
Lymphocyte %: 31.7 %
MCH: 27.5 pg (ref 26.0–34.0)
MCHC: 31.8 g/dL — ABNORMAL LOW (ref 32.0–36.0)
MCV: 86 fL (ref 80–100)
MONO ABS: 0.6 x10 3/mm (ref 0.2–0.9)
MONOS PCT: 9.2 %
NEUTROS PCT: 57.2 %
Neutrophil #: 3.8 10*3/uL (ref 1.4–6.5)
PLATELETS: 232 10*3/uL (ref 150–440)
RBC: 4.47 10*6/uL (ref 3.80–5.20)
RDW: 14.1 % (ref 11.5–14.5)
WBC: 6.6 10*3/uL (ref 3.6–11.0)

## 2013-04-23 LAB — IRON AND TIBC
IRON BIND. CAP.(TOTAL): 354 ug/dL (ref 250–450)
IRON: 56 ug/dL (ref 50–170)
Iron Saturation: 16 %
Unbound Iron-Bind.Cap.: 298 ug/dL

## 2013-04-23 LAB — AMYLASE: Amylase: 41 U/L (ref 25–115)

## 2013-04-23 LAB — COMPREHENSIVE METABOLIC PANEL
ANION GAP: 0 — AB (ref 7–16)
Albumin: 3.6 g/dL (ref 3.4–5.0)
Alkaline Phosphatase: 65 U/L
BUN: 10 mg/dL (ref 7–18)
Bilirubin,Total: 0.9 mg/dL (ref 0.2–1.0)
CO2: 27 mmol/L (ref 21–32)
Calcium, Total: 8.9 mg/dL (ref 8.5–10.1)
Chloride: 108 mmol/L — ABNORMAL HIGH (ref 98–107)
Creatinine: 0.7 mg/dL (ref 0.60–1.30)
EGFR (African American): >60
EGFR (Non-African Amer.): >60
Glucose: 80 mg/dL (ref 65–99)
OSMOLALITY: 268 (ref 275–301)
Potassium: 3.8 mmol/L (ref 3.5–5.1)
SGOT(AST): 20 U/L (ref 15–37)
SGPT (ALT): 18 U/L (ref 12–78)
Sodium: 135 mmol/L — ABNORMAL LOW (ref 136–145)
TOTAL PROTEIN: 7.5 g/dL (ref 6.4–8.2)

## 2013-04-23 LAB — PHOSPHORUS: Phosphorus: 2.4 mg/dL — ABNORMAL LOW (ref 2.5–4.9)

## 2013-04-23 LAB — MAGNESIUM: Magnesium: 1.6 mg/dL — ABNORMAL LOW

## 2013-04-23 LAB — FOLATE: FOLIC ACID: 13.8 ng/mL (ref 3.1–100.0)

## 2013-04-23 LAB — FERRITIN: FERRITIN (ARMC): 15 ng/mL (ref 8–388)

## 2013-05-12 ENCOUNTER — Emergency Department: Payer: Self-pay | Admitting: Emergency Medicine

## 2013-05-12 LAB — CBC WITH DIFFERENTIAL/PLATELET
BASOS PCT: 0.2 %
Basophil #: 0 10*3/uL (ref 0.0–0.1)
Eosinophil #: 0 10*3/uL (ref 0.0–0.7)
Eosinophil %: 0.3 %
HCT: 41.8 % (ref 35.0–47.0)
HGB: 13.7 g/dL (ref 12.0–16.0)
Lymphocyte #: 0.4 10*3/uL — ABNORMAL LOW (ref 1.0–3.6)
Lymphocyte %: 3.7 %
MCH: 28.6 pg (ref 26.0–34.0)
MCHC: 32.9 g/dL (ref 32.0–36.0)
MCV: 87 fL (ref 80–100)
Monocyte #: 0.4 x10 3/mm (ref 0.2–0.9)
Monocyte %: 3.3 %
Neutrophil #: 10.6 10*3/uL — ABNORMAL HIGH (ref 1.4–6.5)
Neutrophil %: 92.5 %
Platelet: 228 10*3/uL (ref 150–440)
RBC: 4.81 10*6/uL (ref 3.80–5.20)
RDW: 14.2 % (ref 11.5–14.5)
WBC: 11.4 10*3/uL — ABNORMAL HIGH (ref 3.6–11.0)

## 2013-05-12 LAB — COMPREHENSIVE METABOLIC PANEL
ALT: 23 U/L (ref 12–78)
AST: 27 U/L (ref 15–37)
Albumin: 3.9 g/dL (ref 3.4–5.0)
Alkaline Phosphatase: 78 U/L
Anion Gap: 3 — ABNORMAL LOW (ref 7–16)
BILIRUBIN TOTAL: 1.6 mg/dL — AB (ref 0.2–1.0)
BUN: 12 mg/dL (ref 7–18)
CO2: 28 mmol/L (ref 21–32)
CREATININE: 0.74 mg/dL (ref 0.60–1.30)
Calcium, Total: 8.8 mg/dL (ref 8.5–10.1)
Chloride: 105 mmol/L (ref 98–107)
Glucose: 98 mg/dL (ref 65–99)
Osmolality: 272 (ref 275–301)
Potassium: 3.8 mmol/L (ref 3.5–5.1)
Sodium: 136 mmol/L (ref 136–145)
Total Protein: 8 g/dL (ref 6.4–8.2)

## 2013-05-12 LAB — URINALYSIS, COMPLETE
BACTERIA: NONE SEEN
BILIRUBIN, UR: NEGATIVE
BLOOD: NEGATIVE
GLUCOSE, UR: NEGATIVE mg/dL (ref 0–75)
Leukocyte Esterase: NEGATIVE
NITRITE: NEGATIVE
PROTEIN: NEGATIVE
Ph: 5 (ref 4.5–8.0)
RBC,UR: 1 /HPF (ref 0–5)
SPECIFIC GRAVITY: 1.027 (ref 1.003–1.030)
Squamous Epithelial: 4

## 2013-05-12 LAB — LIPASE, BLOOD: Lipase: 108 U/L (ref 73–393)

## 2013-05-17 ENCOUNTER — Other Ambulatory Visit: Payer: Self-pay | Admitting: Internal Medicine

## 2013-09-04 ENCOUNTER — Ambulatory Visit: Payer: 59 | Admitting: Internal Medicine

## 2013-09-17 ENCOUNTER — Encounter: Payer: Self-pay | Admitting: Internal Medicine

## 2013-09-17 ENCOUNTER — Ambulatory Visit (INDEPENDENT_AMBULATORY_CARE_PROVIDER_SITE_OTHER): Payer: BC Managed Care – PPO | Admitting: Internal Medicine

## 2013-09-17 VITALS — BP 110/70 | HR 59 | Temp 98.2°F | Ht 66.0 in | Wt 225.8 lb

## 2013-09-17 DIAGNOSIS — G43809 Other migraine, not intractable, without status migrainosus: Secondary | ICD-10-CM

## 2013-09-17 DIAGNOSIS — G932 Benign intracranial hypertension: Secondary | ICD-10-CM

## 2013-09-17 DIAGNOSIS — K219 Gastro-esophageal reflux disease without esophagitis: Secondary | ICD-10-CM

## 2013-09-17 DIAGNOSIS — E282 Polycystic ovarian syndrome: Secondary | ICD-10-CM

## 2013-09-17 DIAGNOSIS — R011 Cardiac murmur, unspecified: Secondary | ICD-10-CM

## 2013-09-17 DIAGNOSIS — E039 Hypothyroidism, unspecified: Secondary | ICD-10-CM

## 2013-09-17 DIAGNOSIS — E559 Vitamin D deficiency, unspecified: Secondary | ICD-10-CM

## 2013-09-17 NOTE — Progress Notes (Signed)
Pre visit review using our clinic review tool, if applicable. No additional management support is needed unless otherwise documented below in the visit note. 

## 2013-09-21 ENCOUNTER — Encounter: Payer: Self-pay | Admitting: Internal Medicine

## 2013-09-21 DIAGNOSIS — R011 Cardiac murmur, unspecified: Secondary | ICD-10-CM | POA: Insufficient documentation

## 2013-09-21 NOTE — Assessment & Plan Note (Signed)
Controlled on omeprazole.  Follow.  

## 2013-09-21 NOTE — Assessment & Plan Note (Signed)
New murmur.  Check ECHO.

## 2013-09-21 NOTE — Assessment & Plan Note (Signed)
On thyroid placement.  Follow tsh.   

## 2013-09-21 NOTE — Progress Notes (Signed)
  Subjective:    Patient ID: Anna Vasquez, female    DOB: 01/09/1986, 28 y.o.   MRN: [id number]  HPI 28 year old female with past history of pseudotumor cerebri followed by neruology, GERD, frequent headaches/migraines and hypothyroidism who comes in today for a scheduled follow up.  She states her headaches are better now.  No headaches recently.   Was diagnosed with pseudotumor cerebri in 2009.  Off Diamox.  Seeing neurology. 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   Doing well s/p her procedure.  Staying hydrated.  She is walking.  Feels better.  Has lost weight.  Acid reflux controlled relatively well on omeprazole.       Past Medical History  Diagnosis Date  . GERD (gastroesophageal reflux disease)   . Frequent headaches   . Hypothyroidism   . H/O febrile seizure   . Hx of migraines   . Pseudotumor cerebri   . PCOD (polycystic ovarian disease)   . Hypothyroidism   . Thoracic outlet syndrome     extra cervical ribs  . Vitamin D deficiency     Outpatient Encounter Prescriptions as of 09/17/2013  Medication Sig  . acetaminophen (TYLENOL) 325 MG tablet Take 650 mg by mouth every 6 (six) hours as needed for pain.  . B Complex Vitamins (B COMPLEX-B12 PO) Take by mouth daily.  . Cholecalciferol (VITAMIN D3) 5000 UNITS CAPS Take by mouth daily.  . IRON PO Take by mouth.  . magnesium gluconate (MAGONATE) 500 MG tablet Take 500 mg by mouth daily.  . Multiple Vitamin (MULTIVITAMIN) tablet Take 1 tablet by mouth daily.  Marland Kitchen. omeprazole (PRILOSEC OTC) 20 MG tablet Take 20 mg by mouth daily.  Marland Kitchen. SYNTHROID 112 MCG tablet Take 1 tablet by mouth daily before breakfast.    Review of Systems No headaches now.  No lightheadedness or dizziness.  No chest pain or tightness.  No sob.  Breathing doing well.  No nausea or vomiting.  Acid reflux controlled - on omeprazole.  No abdominal pain or cramping.  No bowel change.  No urinary symptoms.  Has lost weight.  Feels better.        Objective:   Physical  Exam  Filed Vitals:   09/17/13 1336  BP: 110/70  Pulse: 59  Temp: 98.2 F (5936.738 C)   28 year old female in no acute distress.   HEENT:  Nares- clear.  Oropharynx - without lesions. NECK:  Supple.  Nontender.  No audible bruit.  HEART:  Appears to be regular.  I/VI systolic murmur.   LUNGS:  No crackles or wheezing audible.  Respirations even and unlabored.  RADIAL PULSE:  Equal bilaterally.   ABDOMEN:  Soft, nontender.  Bowel sounds present and normal.  No audible abdominal bruit.   EXTREMITIES:  No increased edema present.  DP pulses palpable and equal bilaterally.          Assessment & Plan:  HEALTH MAINTENANCE.  Physical last visit.

## 2013-09-21 NOTE — Assessment & Plan Note (Signed)
Sees neurology.  Currently stable.  No headaches recently.

## 2013-09-21 NOTE — Assessment & Plan Note (Signed)
Doing better.  No headaches recently.

## 2013-09-21 NOTE — Assessment & Plan Note (Signed)
Continue vitamin D supplements.  

## 2013-09-21 NOTE — Assessment & Plan Note (Signed)
Stable.  Follow.   

## 2013-10-12 ENCOUNTER — Other Ambulatory Visit (INDEPENDENT_AMBULATORY_CARE_PROVIDER_SITE_OTHER): Payer: BC Managed Care – PPO

## 2013-10-12 ENCOUNTER — Other Ambulatory Visit: Payer: Self-pay

## 2013-10-12 DIAGNOSIS — R011 Cardiac murmur, unspecified: Secondary | ICD-10-CM

## 2013-10-13 ENCOUNTER — Encounter: Payer: Self-pay | Admitting: Internal Medicine

## 2013-12-07 ENCOUNTER — Ambulatory Visit (INDEPENDENT_AMBULATORY_CARE_PROVIDER_SITE_OTHER): Payer: BC Managed Care – PPO | Admitting: *Deleted

## 2013-12-07 DIAGNOSIS — Z23 Encounter for immunization: Secondary | ICD-10-CM

## 2014-03-01 ENCOUNTER — Other Ambulatory Visit: Payer: Self-pay | Admitting: Internal Medicine

## 2014-03-19 ENCOUNTER — Ambulatory Visit (INDEPENDENT_AMBULATORY_CARE_PROVIDER_SITE_OTHER): Payer: BC Managed Care – PPO | Admitting: Internal Medicine

## 2014-03-19 ENCOUNTER — Encounter: Payer: Self-pay | Admitting: Internal Medicine

## 2014-03-19 VITALS — BP 118/80 | HR 66 | Temp 98.1°F | Ht 65.5 in | Wt 217.8 lb

## 2014-03-19 DIAGNOSIS — E559 Vitamin D deficiency, unspecified: Secondary | ICD-10-CM

## 2014-03-19 DIAGNOSIS — Z9889 Other specified postprocedural states: Secondary | ICD-10-CM

## 2014-03-19 DIAGNOSIS — E039 Hypothyroidism, unspecified: Secondary | ICD-10-CM

## 2014-03-19 DIAGNOSIS — G932 Benign intracranial hypertension: Secondary | ICD-10-CM

## 2014-03-19 DIAGNOSIS — M2550 Pain in unspecified joint: Secondary | ICD-10-CM | POA: Insufficient documentation

## 2014-03-19 DIAGNOSIS — E669 Obesity, unspecified: Secondary | ICD-10-CM

## 2014-03-19 DIAGNOSIS — G43809 Other migraine, not intractable, without status migrainosus: Secondary | ICD-10-CM

## 2014-03-19 DIAGNOSIS — K219 Gastro-esophageal reflux disease without esophagitis: Secondary | ICD-10-CM

## 2014-03-19 LAB — RHEUMATOID FACTOR

## 2014-03-19 NOTE — Progress Notes (Signed)
Pre visit review using our clinic review tool, if applicable. No additional management support is needed unless otherwise documented below in the visit note. 

## 2014-03-20 ENCOUNTER — Encounter: Payer: Self-pay | Admitting: Internal Medicine

## 2014-03-20 LAB — ANA: Anti Nuclear Antibody(ANA): NEGATIVE

## 2014-03-20 LAB — CBC WITH DIFFERENTIAL/PLATELET
BASOS PCT: 0.4 % (ref 0.0–3.0)
Basophils Absolute: 0 10*3/uL (ref 0.0–0.1)
Eosinophils Absolute: 0.1 10*3/uL (ref 0.0–0.7)
Eosinophils Relative: 1.1 % (ref 0.0–5.0)
HCT: 38.7 % (ref 36.0–46.0)
HEMOGLOBIN: 12.5 g/dL (ref 12.0–15.0)
Lymphocytes Relative: 21.7 % (ref 12.0–46.0)
Lymphs Abs: 2.4 10*3/uL (ref 0.7–4.0)
MCHC: 32.2 g/dL (ref 30.0–36.0)
MCV: 89.6 fl (ref 78.0–100.0)
MONOS PCT: 4.6 % (ref 3.0–12.0)
Monocytes Absolute: 0.5 10*3/uL (ref 0.1–1.0)
NEUTROS ABS: 8 10*3/uL — AB (ref 1.4–7.7)
Neutrophils Relative %: 72.2 % (ref 43.0–77.0)
Platelets: 220 10*3/uL (ref 150.0–400.0)
RBC: 4.32 Mil/uL (ref 3.87–5.11)
RDW: 13.3 % (ref 11.5–15.5)
WBC: 11.1 10*3/uL — ABNORMAL HIGH (ref 4.0–10.5)

## 2014-03-20 LAB — VITAMIN B12: VITAMIN B 12: 216 pg/mL (ref 211–911)

## 2014-03-20 LAB — TSH: TSH: 1.86 u[IU]/mL (ref 0.35–4.50)

## 2014-03-20 LAB — SEDIMENTATION RATE: Sed Rate: 16 mm/hr (ref 0–22)

## 2014-03-20 LAB — C-REACTIVE PROTEIN: CRP: <0.5 mg/dL (ref 0.5–20.0)

## 2014-03-21 ENCOUNTER — Other Ambulatory Visit: Payer: BC Managed Care – PPO

## 2014-03-21 ENCOUNTER — Other Ambulatory Visit: Payer: Self-pay | Admitting: Internal Medicine

## 2014-03-21 DIAGNOSIS — M25529 Pain in unspecified elbow: Secondary | ICD-10-CM

## 2014-03-23 ENCOUNTER — Encounter: Payer: Self-pay | Admitting: Internal Medicine

## 2014-03-23 DIAGNOSIS — Z6834 Body mass index (BMI) 34.0-34.9, adult: Secondary | ICD-10-CM | POA: Insufficient documentation

## 2014-03-23 NOTE — Progress Notes (Signed)
Subjective:    Patient ID: Anna Vasquez, female    DOB: 1985/09/16, 29 y.o.   MRN: [id number]  HPI 29 year old female with past history of pseudotumor cerebri followed by neruology, GERD, frequent headaches/migraines and hypothyroidism who comes in today to follow up on these issues as well as for a complete physical exam.   She states her headaches are better now.  No headaches reported.  Was diagnosed with pseudotumor cerebri in 2009.  Off Diamox.  Seeing neurology. 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   Doing well s/p her procedure.  Staying hydrated.  She is walking.  Feels better.  Has lost weight.  Acid reflux controlled on no medication.  Her main complaint is that of bilateral hip and kinee pain.  Worse after she has been sitting or lying for a while (and then goes to get up).  Does not bother her when she is exercising.  Does bother her when she stands a lot.  Has been taking ibuprofen.       Past Medical History  Diagnosis Date  . GERD (gastroesophageal reflux disease)   . Frequent headaches   . Hypothyroidism   . H/O febrile seizure   . Hx of migraines   . Pseudotumor cerebri   . PCOD (polycystic ovarian disease)   . Hypothyroidism   . Thoracic outlet syndrome     extra cervical ribs  . Vitamin D deficiency     Outpatient Encounter Prescriptions as of 03/19/2014  Medication Sig  . acetaminophen (TYLENOL) 325 MG tablet Take 650 mg by mouth every 6 (six) hours as needed for pain.  Marland Kitchen SYNTHROID 112 MCG tablet TAKE 1 TABLET BY MOUTH EVERY DAY  . B Complex Vitamins (B COMPLEX-B12 PO) Take by mouth daily.  . Cholecalciferol (VITAMIN D3) 5000 UNITS CAPS Take by mouth daily.  . IRON PO Take by mouth.  . magnesium gluconate (MAGONATE) 500 MG tablet Take 500 mg by mouth daily.  . Multiple Vitamin (MULTIVITAMIN) tablet Take 1 tablet by mouth daily.  Marland Kitchen omeprazole (PRILOSEC OTC) 20 MG tablet Take 20 mg by mouth daily.    Review of Systems No headaches reported now.  No lightheadedness  or dizziness.  No chest pain or tightness.  No sob.  Breathing doing well.  No nausea or vomiting.  Acid reflux controlled.  Off omeprazole.  No abdominal pain or cramping.  No bowel change.  No urinary symptoms.  Has lost weight.  Feels better.   Joint pain as outlined.  Taking ibuprofen.       Objective:   Physical Exam  Filed Vitals:   03/19/14 1542  BP: 118/80  Pulse: 66  Temp: 98.1 F (36.7 C)   Blood pressure recheck:  114/68, pulse 3  29 year old female in no acute distress.   HEENT:  Nares- clear.  Oropharynx - without lesions. NECK:  Supple.  Nontender.  No audible bruit.  HEART:  Appears to be regular. LUNGS:  No crackles or wheezing audible.  Respirations even and unlabored.  RADIAL PULSE:  Equal bilaterally.    BREASTS:  No nipple discharge or nipple retraction present.  Could not appreciate any distinct nodules or axillary adenopathy.  ABDOMEN:  Soft, nontender.  Bowel sounds present and normal.  No audible abdominal bruit.  GU:  Not performed.    EXTREMITIES:  No increased edema present.  DP pulses palpable and equal bilaterally.          Assessment & Plan:  1.  Hypothyroidism, unspecified hypothyroidism type On synthroid.  Follow tsh.  - TSH  2. Joint pain Bilateral hip and knee pain.  See above.  Better with exercise.  Family history of rheumatoid arthritis.  Check ESR and rheum panel.  Further w/up pening.   - Sedimentation rate - C-reactive protein - Rheumatoid factor - ANA - CBC with Differential  3. S/P gastric surgery Doing well s/p surgery.  Is watching diet and exercising.  Losing weight.  Will check B12 level given history of gastric surgery.   - Vitamin B12  4. Obesity (BMI 30-39.9) She is losing weight.  Is s/p gastric sleeve.  Has adjusted her diet and is losing weight.    5. Pseudotumor cerebri Doing better.  No headaches recently.    6. Other migraine without status migrainosus, not intractable No headaches reported.    7.  Gastroesophageal reflux disease, esophagitis presence not specified Now that she has adjusted her diet and lost weight, no reflux.  On no medication.    8. Vitamin D deficiency Vitamin D supplements.  Follow vitamin D leve.    HEALTH MAINTENANCE.  Physical today.  Cholesterol checked last labs - wnl.        I spent 25 minutes with the patient and more than 50% of the time was spent in consultation regarding the above.

## 2014-03-25 LAB — METHYLMALONIC ACID, SERUM

## 2014-03-27 ENCOUNTER — Telehealth: Payer: Self-pay | Admitting: Internal Medicine

## 2014-03-27 NOTE — Telephone Encounter (Signed)
Pt notified of lab results via my chart.  Needs a non fasting lab in a few weeks.  Please schedule and contact her with an appt date and time.  Thanks.

## 2014-03-29 LAB — METHYLMALONIC ACID, SERUM: Methylmalonic Acid, Quant: 120 nmol/L (ref 87–318)

## 2014-04-10 ENCOUNTER — Other Ambulatory Visit: Payer: Self-pay

## 2014-04-12 ENCOUNTER — Other Ambulatory Visit: Payer: Self-pay

## 2014-04-15 ENCOUNTER — Other Ambulatory Visit (INDEPENDENT_AMBULATORY_CARE_PROVIDER_SITE_OTHER): Payer: BLUE CROSS/BLUE SHIELD

## 2014-04-15 ENCOUNTER — Encounter: Payer: Self-pay | Admitting: Internal Medicine

## 2014-04-15 ENCOUNTER — Telehealth: Payer: Self-pay | Admitting: *Deleted

## 2014-04-15 DIAGNOSIS — D72829 Elevated white blood cell count, unspecified: Secondary | ICD-10-CM

## 2014-04-15 DIAGNOSIS — E538 Deficiency of other specified B group vitamins: Secondary | ICD-10-CM

## 2014-04-15 LAB — CBC WITH DIFFERENTIAL/PLATELET
Basophils Absolute: 0 10*3/uL (ref 0.0–0.1)
Basophils Relative: 0.4 % (ref 0.0–3.0)
EOS ABS: 0.1 10*3/uL (ref 0.0–0.7)
Eosinophils Relative: 1.8 % (ref 0.0–5.0)
HCT: 41.1 % (ref 36.0–46.0)
Hemoglobin: 13.5 g/dL (ref 12.0–15.0)
LYMPHS ABS: 2 10*3/uL (ref 0.7–4.0)
Lymphocytes Relative: 27.9 % (ref 12.0–46.0)
MCHC: 32.8 g/dL (ref 30.0–36.0)
MCV: 88.3 fl (ref 78.0–100.0)
Monocytes Absolute: 0.4 10*3/uL (ref 0.1–1.0)
Monocytes Relative: 5.7 % (ref 3.0–12.0)
NEUTROS PCT: 64.2 % (ref 43.0–77.0)
Neutro Abs: 4.6 10*3/uL (ref 1.4–7.7)
PLATELETS: 228 10*3/uL (ref 150.0–400.0)
RBC: 4.65 Mil/uL (ref 3.87–5.11)
RDW: 13.9 % (ref 11.5–15.5)
WBC: 7.1 10*3/uL (ref 4.0–10.5)

## 2014-04-15 NOTE — Telephone Encounter (Signed)
Orders placed for labs

## 2014-04-15 NOTE — Telephone Encounter (Signed)
What labs and dx?  

## 2014-04-18 LAB — METHYLMALONIC ACID, SERUM: METHYLMALONIC ACID, QUANT: 184 nmol/L (ref 87–318)

## 2014-04-21 ENCOUNTER — Encounter: Payer: Self-pay | Admitting: Internal Medicine

## 2014-04-25 NOTE — Telephone Encounter (Signed)
Unread mychart message mailed to patient 

## 2014-05-15 ENCOUNTER — Encounter: Payer: Self-pay | Admitting: Internal Medicine

## 2014-05-15 ENCOUNTER — Other Ambulatory Visit: Payer: Self-pay | Admitting: *Deleted

## 2014-05-15 MED ORDER — SYNTHROID 112 MCG PO TABS: 112.0000 ug | ORAL_TABLET | Freq: Every day | ORAL | Status: DC

## 2014-07-06 ENCOUNTER — Encounter: Payer: Self-pay | Admitting: Internal Medicine

## 2014-07-06 ENCOUNTER — Emergency Department
Admit: 2014-07-06 | Disposition: A | Discharge status: Home / Self Care | Payer: Self-pay | Admitting: Emergency Medicine

## 2014-07-06 LAB — URINALYSIS, COMPLETE
BLOOD: NEGATIVE
Bacteria: NONE SEEN
Bilirubin,UR: NEGATIVE
Glucose,UR: NEGATIVE mg/dL (ref 0–75)
Ketone: NEGATIVE
LEUKOCYTE ESTERASE: NEGATIVE
NITRITE: NEGATIVE
Ph: 6 (ref 4.5–8.0)
Protein: NEGATIVE
RBC,UR: NONE SEEN /HPF (ref 0–5)
Specific Gravity: 1.021 (ref 1.003–1.030)

## 2014-07-06 LAB — BASIC METABOLIC PANEL
Anion Gap: 6 — ABNORMAL LOW (ref 7–16)
BUN: 13 mg/dL (ref 4–21)
BUN: 13 mg/dL
CHLORIDE: 106 mmol/L
CO2: 27 mmol/L
CREATININE: 0.7 mg/dL (ref 0.5–1.1)
CREATININE: 0.74 mg/dL
Calcium, Total: 9 mg/dL
EGFR (African American): >60
EGFR (Non-African Amer.): >60
Glucose: 70 mg/dL
Glucose: 70 mg/dL
POTASSIUM: 3.8 mmol/L
Potassium: 3.8 mmol/L (ref 3.4–5.3)
Sodium: 139 mmol/L (ref 137–147)
Sodium: 139 mmol/L

## 2014-07-06 LAB — CBC WITH DIFFERENTIAL/PLATELET
BASOS ABS: 0 10*3/uL (ref 0.0–0.1)
BASOS PCT: 0.5 %
Eosinophil #: 0.1 10*3/uL (ref 0.0–0.7)
Eosinophil %: 0.6 %
HCT: 49 % — ABNORMAL HIGH (ref 35.0–47.0)
HGB: 15.8 g/dL (ref 12.0–16.0)
LYMPHS ABS: 1.3 10*3/uL (ref 1.0–3.6)
LYMPHS PCT: 13 %
MCH: 28.8 pg (ref 26.0–34.0)
MCHC: 32.2 g/dL (ref 32.0–36.0)
MCV: 90 fL (ref 80–100)
Monocyte #: 0.5 x10 3/mm (ref 0.2–0.9)
Monocyte %: 5 %
NEUTROS ABS: 8.1 10*3/uL — AB (ref 1.4–6.5)
Neutrophil %: 80.9 %
Platelet: 280 10*3/uL (ref 150–440)
RBC: 5.46 10*6/uL — AB (ref 3.80–5.20)
RDW: 14.3 % (ref 11.5–14.5)
WBC: 10 10*3/uL (ref 3.6–11.0)

## 2014-07-06 LAB — TSH
THYROID STIMULATING HORM: 1.181 u[IU]/mL
TSH: 1.18 u[IU]/mL (ref 0.41–5.90)

## 2014-07-06 LAB — CBC AND DIFFERENTIAL
HEMATOCRIT: 49 % — AB (ref 36–46)
Hemoglobin: 15.8 g/dL (ref 12.0–16.0)
Platelets: 280 10*3/uL (ref 150–399)
WBC: 10 10*3/mL

## 2014-07-06 LAB — TROPONIN I

## 2014-07-06 LAB — T4, FREE: FREE THYROXINE: 1.23 ng/dL — AB

## 2014-07-08 ENCOUNTER — Encounter: Payer: Self-pay | Admitting: Internal Medicine

## 2014-07-08 ENCOUNTER — Ambulatory Visit (INDEPENDENT_AMBULATORY_CARE_PROVIDER_SITE_OTHER): Payer: BLUE CROSS/BLUE SHIELD | Admitting: Internal Medicine

## 2014-07-08 VITALS — BP 96/63 | HR 70 | Temp 98.1°F | Ht 65.5 in | Wt 205.1 lb

## 2014-07-08 DIAGNOSIS — R55 Syncope and collapse: Secondary | ICD-10-CM | POA: Diagnosis not present

## 2014-07-08 DIAGNOSIS — R002 Palpitations: Secondary | ICD-10-CM | POA: Diagnosis not present

## 2014-07-08 NOTE — Progress Notes (Signed)
Pre visit review using our clinic review tool, if applicable. No additional management support is needed unless otherwise documented below in the visit note. 

## 2014-07-09 ENCOUNTER — Encounter: Payer: Self-pay | Admitting: Cardiovascular Disease

## 2014-07-09 ENCOUNTER — Encounter: Payer: Self-pay | Admitting: Internal Medicine

## 2014-07-09 ENCOUNTER — Ambulatory Visit (INDEPENDENT_AMBULATORY_CARE_PROVIDER_SITE_OTHER): Payer: BLUE CROSS/BLUE SHIELD | Admitting: Cardiovascular Disease

## 2014-07-09 VITALS — BP 117/76 | HR 67 | Ht 66.0 in | Wt 206.0 lb

## 2014-07-09 DIAGNOSIS — R55 Syncope and collapse: Secondary | ICD-10-CM

## 2014-07-09 NOTE — Progress Notes (Signed)
Primary care physician: Dr. Dale Durhamharlene Scott  HPI  This is a 29 year old female who was referred for evaluation of syncope. She has no previous cardiac history. There was possible cardiac murmur heard last year. She had an echocardiogram done in July 2015 which was completely normal. She has known history of hypothyroidism, migraines and GERD. On Friday night, she drank cold water and noticed spasms in her chest with some discomfort. She did not feel well all night. She started having fluttering sensation. She woke up Saturday morning still not feeling well. She took a shower and after she dried off, she had a sudden loss of consciousness which caused a noise. She was visiting her parents came and checked her heart. From her description, she was out for only about 15-20 seconds. She had no incontinence, tongue biting or confusion. No seizure activities were noted. She denies any chest pain or shortness of breath. Palpitations improved. She denies any previous similar episodes. She was [facility]. Basic workup was negative.  Allergies  Allergen Reactions  . Tetracyclines & Related      Current Outpatient Prescriptions on File Prior to Visit  Medication Sig Dispense Refill  . acetaminophen (TYLENOL) 325 MG tablet Take 650 mg by mouth every 6 (six) hours as needed for pain.    Marland Kitchen. SYNTHROID 112 MCG tablet Take 1 tablet (112 mcg total) by mouth daily. 30 tablet 6   No current facility-administered medications on file prior to visit.     Past Medical History  Diagnosis Date  . GERD (gastroesophageal reflux disease)   . Frequent headaches   . Hypothyroidism   . H/O febrile seizure   . Hx of migraines   . Pseudotumor cerebri   . PCOD (polycystic ovarian disease)   . Hypothyroidism   . Thoracic outlet syndrome     extra cervical ribs  . Vitamin D deficiency      Past Surgical History  Procedure Laterality Date  . Mouth surgery  1998  . Laparoscopic gastric sleeve  resection       Family History  Problem Relation Age of Onset  . Arthritis Father   . Hypertension Father   . Sleep apnea Father   . Transient ischemic attack Father   . Breast cancer Paternal Aunt   . Mental illness Paternal Uncle   . Prostate cancer Paternal Grandfather      History   Social History  . Marital Status: Single    Spouse Name: N/A  . Number of Children: N/A  . Years of Education: N/A   Occupational History  . Not on file.   Social History Main Topics  . Smoking status: Never Smoker   . Smokeless tobacco: Never Used  . Alcohol Use: 0.0 oz/week    0 Standard drinks or equivalent per week  . Drug Use: No  . Sexual Activity: Not on file   Other Topics Concern  . Not on file   Social History Narrative     ROS A 10 point review of system was performed. It is negative other than that mentioned in the history of present illness.   PHYSICAL EXAM   BP 117/76 mmHg  Pulse 67  Ht 5\' 6"  (1.676 m)  Wt 206 lb (93.441 kg)  BMI 33.27 kg/m2 Constitutional: She is oriented to person, place, and time. She appears well-developed and well-nourished. No distress.  HENT: No nasal discharge.  Head: Normocephalic and atraumatic.  Eyes: Pupils are equal and round.  No discharge.  Neck: Normal range of motion. Neck supple. No JVD present. No thyromegaly present.  Cardiovascular: Normal rate, regular rhythm, normal heart sounds. Exam reveals no gallop and no friction rub. No murmur heard.  Pulmonary/Chest: Effort normal and breath sounds normal. No stridor. No respiratory distress. She has no wheezes. She has no rales. She exhibits no tenderness.  Abdominal: Soft. Bowel sounds are normal. She exhibits no distension. There is no tenderness. There is no rebound and no guarding.  Musculoskeletal: Normal range of motion. She exhibits no edema and no tenderness.  Neurological: She is alert and oriented to person, place, and time. Coordination normal.  Skin: Skin is warm  and dry. No rash noted. She is not diaphoretic. No erythema. No pallor.  Psychiatric: She has a normal mood and affect. Her behavior is normal. Judgment and thought content normal.     YQI:HKVQQ  Rhythm  Low voltage in precordial leads.   ABNORMAL     ASSESSMENT AND PLAN

## 2014-07-09 NOTE — Progress Notes (Signed)
Patient ID: Anna Vasquez, female   DOB: 07/08/1985, 29 y.o.   MRN: [id number]   Subjective:    Patient ID: Anna Vasquez, female    DOB: 06/02/1985, 29 y.o.   MRN: [id number]  HPI  Patient here as a work in for ER follow up.  She is accompanied by her mother.  History obtained from both of them.  She reports that on 07/05/14 night - drank cold water and noticed a "spasm" in her chest.  Chest discomfort.  This lasted for a while.  States that through the night, she just didn't feel well.  Reports intermittent awakenings.  Felt cold and clammy.  Described continue heart fluttering and jittery sensation throughout the night.  In am, still did not feel well.  Up brushing her teeth, etc.  No dizziness or light headedness.  Did still report the palpitations/fluttering - persisted.  Showered.  Finished.  Syncopal episode occurred after drying off.  Was not aware was going to pass out.  No dizziness or light headedness. No nausea or vomiting.  No diarrhea or other bowel change.  No abdominal pain or cramping.  Denies possibility of being pregnant.  Mother states out for at least 15 seconds.  No confusion after the syncopal episode.  Went to ER.  EKG and labs unrevealing.  Discharged to f/u here today.  Felt fatigued the remainder of the weekend.  No further syncopal or near syncopal episodes.  No nausea, vomiting or diarrhea.  Of note, did not hit head or hurt herself with the fall.    Past Medical History  Diagnosis Date  . GERD (gastroesophageal reflux disease)   . Frequent headaches   . Hypothyroidism   . H/O febrile seizure   . Hx of migraines   . Pseudotumor cerebri   . PCOD (polycystic ovarian disease)   . Hypothyroidism   . Thoracic outlet syndrome     extra cervical ribs  . Vitamin D deficiency     Current Outpatient Prescriptions on File Prior to Visit  Medication Sig Dispense Refill  . acetaminophen (TYLENOL) 325 MG tablet Take 650 mg by mouth every 6 (six) hours as needed for pain.     Marland Kitchen. SYNTHROID 112 MCG tablet Take 1 tablet (112 mcg total) by mouth daily. 30 tablet 6   No current facility-administered medications on file prior to visit.    Review of Systems  Constitutional: Negative for appetite change, fatigue and unexpected weight change.  HENT: Negative for congestion and sinus pressure.   Respiratory: Negative for cough and shortness of breath.   Cardiovascular: Positive for chest pain (as outlined. ) and palpitations. Negative for leg swelling.       Palpitations and fluttering as outlined.    Gastrointestinal: Negative for nausea, vomiting, abdominal pain and diarrhea.  Neurological: Negative for dizziness, light-headedness and headaches.       Syncope as outlined.         Objective:     Blood pressure 104/68 lying and 104/64 standing.    Physical Exam  Constitutional: She appears well-developed. No distress.  HENT:  Nose: Nose normal.  Mouth/Throat: Oropharynx is clear and moist.  Neck: Neck supple. No thyromegaly present.  Cardiovascular: Normal rate and regular rhythm.   Pulmonary/Chest: Breath sounds normal. No respiratory distress. She has no wheezes.  Abdominal: Soft. Bowel sounds are normal. There is no tenderness.  Musculoskeletal: She exhibits no edema or tenderness.  Lymphadenopathy:    She has no cervical adenopathy.  Skin:  No rash noted. No erythema.    BP 96/63 mmHg  Pulse 70  Temp(Src) 98.1 F (36.7 C) (Oral)  Ht 5' 5.5" (1.664 m)  Wt 205 lb 2 oz (93.044 kg)  BMI 33.60 kg/m2  SpO2 100% Wt Readings from Last 3 Encounters:  07/08/14 205 lb 2 oz (93.044 kg)  03/19/14 217 lb 12 oz (98.771 kg)  09/17/13 225 lb 12 oz (102.4 kg)     Lab Results  Component Value Date   WBC 10.0 07/06/2014   HGB 15.8 07/06/2014   HCT 49* 07/06/2014   PLT 280 07/06/2014   GLUCOSE 86 03/13/2013   CHOL 144 03/13/2013   TRIG 77.0 03/13/2013   HDL 48.50 03/13/2013   LDLCALC 80 03/13/2013   ALT 24 03/13/2013   AST 22 03/13/2013   NA 139  07/06/2014   K 3.8 07/06/2014   CL 105 03/13/2013   CREATININE 0.7 07/06/2014   BUN 13 07/06/2014   CO2 28 03/13/2013   TSH 1.18 07/06/2014       Assessment & Plan:   Problem List Items Addressed This Visit    Syncope    Syncopal episode as outlined.  Denies any dizziness, light headedness, nausea or vomiting prior to the episode.  Did have the persistent palpitations/fluttering that preceded the syncopal episode.  EKG and labs in ER unrevealing.  We discussed possible vasovagal episode.  Unclear etiology.  No warning prior to the episode.  Given this, I do feel she warrants further evaluation.  After discussion, will refer to cardiology.  Question if holter, echo needed.  Pt and her mother comfortable with the plan.         Other Visit Diagnoses    Syncope and collapse    -  Primary    Relevant Orders    Ambulatory referral to Cardiology    Palpitations        Relevant Orders    Ambulatory referral to Cardiology      I spent 25 minutes with the patient and more than 50% of the time was spent in consultation regarding the above.     Dale Nelson, MD

## 2014-07-09 NOTE — Patient Instructions (Signed)
Your physician has recommended that you wear a 48 hour holter monitor. Holter monitors are medical devices that record the heart's electrical activity. Doctors most often use these monitors to diagnose arrhythmias. Arrhythmias are problems with the speed or rhythm of the heartbeat. The monitor is a small, portable device. You can wear one while you do your normal daily activities. This is usually used to diagnose what is causing palpitations/syncope (passing out). - This will be placed by [facility] and they will call you to schedule you to have this placed.  Dr. Kirke CorinArida will see you back as needed pending the results of your heart monitor.

## 2014-07-09 NOTE — Assessment & Plan Note (Signed)
Syncopal episode as outlined.  Denies any dizziness, light headedness, nausea or vomiting prior to the episode.  Did have the persistent palpitations/fluttering that preceded the syncopal episode.  EKG and labs in ER unrevealing.  We discussed possible vasovagal episode.  Unclear etiology.  No warning prior to the episode.  Given this, I do feel she warrants further evaluation.  After discussion, will refer to cardiology.  Question if holter, echo needed.  Pt and her mother comfortable with the plan.

## 2014-07-09 NOTE — Assessment & Plan Note (Signed)
I suspect that the syncopal episode was likely vasovagal in nature. The episode was not suggestive of a seizure disorder. Echocardiogram in July of last year was normal. Given the associated palpitations and sudden onset, there is a slight possibility of arrhythmia. Thus, I requested a 48-hour Holter monitor. I don't recommend further workup unless the episodes become more frequent. Given that the suspicion that this is vasovagal syncope, she can resume driving.

## 2014-07-12 NOTE — Op Note (Signed)
PATIENT NAME:  [name], Anna E MR#:  [id number] DATE OF BIRTH:  1986/01/29  DATE OF PROCEDURE:  12/04/2012  PREOPERATIVE DIAGNOSIS:  Morbid obesity.   POSTOPERATIVE DIAGNOSIS. Morbid obesity.    PROCEDURE: Laparoscopic sleeve gastrectomy.   SURGEON: Primus BravoJon Lachrista Heslin, MD  ASSISTANT:  Mariella SaaSarah Stout, PA.   ANESTHESIA: General endotracheal.   CLINICAL HISTORY: See H and P.  COMPLICATIONS: None.   ESTIMATED BLOOD LOSS: None.   SPECIMENS: Portion of the stomach.   DETAILS OF PROCEDURE: The patient was taken to the operating room and placed on the operating room table, in the supine position, with appropriate monitors and supplemental oxygen being delivered.  Broad spectrum IV antibiotics were administered. The patient was placed under general anesthesia without incident.  The abdomen was prepped and draped in the usual sterile fashion.  Access was obtained using 5 mm Optical trocar. Pneumoperitoneum was established without difficulty. Multiple other ports were placed in preparation for sleeve gastrectomy. A liver retractor was placed without incident. The entire stomach was mobilized from 5 cm from the pylorus all the way up to the fundus, and the fundus was mobilized off the left crura as well completely freeing up the posterior portion of the stomach. Posterior attachments were taken down so the crura could be visualized from both sides.  At that point, everything was removed from the stomach and a 34-French Bougie was placed down into the antrum. An Echelon green load stapler was used to bisect the antrum on first fire starting approximately 5 to 6 cm from the pylorus. I then continued up along the Bougie using a blue load stapler with excellent affect with care not to get too close to the Bougie itself, with minimal traction. This continued all the way up to the left crura. The excess stomach was placed on the side and the Bougie was removed and endoscopy showed no evidence of obstruction at that time.   The excess stomach was removed through the abdominal cavity, and the wounds were closed using 4-0 Vicryl and Dermabond.   ____________________________ Primus BravoJon Shilah Hefel, MD jb:cs D: 12/04/2012 15:50:00 ET T: 12/04/2012 18:33:00 ET JOB#: [id number]  cc: Primus BravoJon Anthony Roland, MD, <Dictator> Cletis AthensJON Marjean DonnaM Akire Rennert MD ELECTRONICALLY SIGNED 12/06/2012 17:27

## 2014-07-12 NOTE — Discharge Summary (Signed)
PATIENT NAME:  Anna Vasquez, Anna Vasquez MR#:  [id number] DATE OF BIRTH:  [birth year]  DATE OF ADMISSION:  12/28/2012 DATE OF DISCHARGE:  01/15/2013  ADMIT DIAGNOSIS: Morbid obesity.  DISCHARGE DIAGNOSIS: Morbid obesity.   CLINICAL HISTORY: See H and P.  DETAILS OF ADMISSION:  The patient underwent bariatric surgery on the day of admission. Postoperatively she had unremarkable course and was proceeded to bariatric clear diet within a reasonable amount of time. She is ambulating and tolerating p.o. She remained afebrile throughout her entire admission with stable vital signs. She was discharged home in stable condition to followup in the office in 2 weeks.  DISCHARGE MEDICATIONS: Include analgesics and antiemetics.   She will also continue on a high-protein, low-carb diet as well as B-complex chewable vitamins.  ____________________________ Primus BravoJon Bruce, MD jb:sg D: 01/15/2013 09:57:54 ET T: 01/15/2013 10:56:18 ET JOB#: [id number]  cc: Primus BravoJon Bruce, MD, <Dictator>

## 2014-07-21 ENCOUNTER — Telehealth: Payer: Self-pay | Admitting: Family

## 2014-07-21 DIAGNOSIS — J019 Acute sinusitis, unspecified: Secondary | ICD-10-CM

## 2014-07-21 MED ORDER — AMOXICILLIN-POT CLAVULANATE 875-125 MG PO TABS: 1.0000 | ORAL_TABLET | Freq: Two times a day (BID) | ORAL | Status: DC

## 2014-07-21 NOTE — Progress Notes (Signed)

## 2014-08-08 ENCOUNTER — Encounter (INDEPENDENT_AMBULATORY_CARE_PROVIDER_SITE_OTHER): Payer: BLUE CROSS/BLUE SHIELD

## 2014-08-08 ENCOUNTER — Other Ambulatory Visit: Payer: Self-pay

## 2014-08-08 DIAGNOSIS — R55 Syncope and collapse: Secondary | ICD-10-CM

## 2014-09-17 ENCOUNTER — Ambulatory Visit: Payer: BLUE CROSS/BLUE SHIELD | Admitting: Internal Medicine

## 2014-11-28 ENCOUNTER — Other Ambulatory Visit: Payer: Self-pay | Admitting: Internal Medicine

## 2014-12-24 ENCOUNTER — Ambulatory Visit: Payer: BLUE CROSS/BLUE SHIELD

## 2014-12-24 ENCOUNTER — Ambulatory Visit (INDEPENDENT_AMBULATORY_CARE_PROVIDER_SITE_OTHER): Payer: BLUE CROSS/BLUE SHIELD | Admitting: Surgical

## 2014-12-24 DIAGNOSIS — Z23 Encounter for immunization: Secondary | ICD-10-CM | POA: Diagnosis not present

## 2014-12-25 ENCOUNTER — Ambulatory Visit: Payer: BLUE CROSS/BLUE SHIELD

## 2014-12-31 DIAGNOSIS — G5603 Carpal tunnel syndrome, bilateral upper limbs: Secondary | ICD-10-CM | POA: Insufficient documentation

## 2015-02-11 ENCOUNTER — Encounter: Payer: Self-pay | Admitting: Internal Medicine

## 2015-02-12 ENCOUNTER — Other Ambulatory Visit: Payer: Self-pay | Admitting: Internal Medicine

## 2015-03-07 ENCOUNTER — Other Ambulatory Visit: Payer: Self-pay | Admitting: Internal Medicine

## 2015-03-07 ENCOUNTER — Encounter: Payer: Self-pay | Admitting: Internal Medicine

## 2015-03-07 ENCOUNTER — Ambulatory Visit (INDEPENDENT_AMBULATORY_CARE_PROVIDER_SITE_OTHER)
Admission: RE | Admit: 2015-03-07 | Discharge: 2015-03-07 | Disposition: A | Discharge status: Home / Self Care | Payer: BLUE CROSS/BLUE SHIELD | Source: Ambulatory Visit | Attending: Internal Medicine | Admitting: Internal Medicine

## 2015-03-07 ENCOUNTER — Ambulatory Visit (INDEPENDENT_AMBULATORY_CARE_PROVIDER_SITE_OTHER): Payer: BLUE CROSS/BLUE SHIELD | Admitting: Internal Medicine

## 2015-03-07 VITALS — BP 118/70 | HR 68 | Temp 98.1°F | Resp 18 | Ht 65.5 in | Wt 213.8 lb

## 2015-03-07 DIAGNOSIS — E039 Hypothyroidism, unspecified: Secondary | ICD-10-CM

## 2015-03-07 DIAGNOSIS — R208 Other disturbances of skin sensation: Secondary | ICD-10-CM | POA: Diagnosis not present

## 2015-03-07 DIAGNOSIS — Z13220 Encounter for screening for lipoid disorders: Secondary | ICD-10-CM | POA: Diagnosis not present

## 2015-03-07 DIAGNOSIS — Z0000 Encounter for general adult medical examination without abnormal findings: Secondary | ICD-10-CM

## 2015-03-07 DIAGNOSIS — G5600 Carpal tunnel syndrome, unspecified upper limb: Secondary | ICD-10-CM

## 2015-03-07 DIAGNOSIS — M25551 Pain in right hip: Secondary | ICD-10-CM | POA: Diagnosis not present

## 2015-03-07 DIAGNOSIS — E669 Obesity, unspecified: Secondary | ICD-10-CM

## 2015-03-07 DIAGNOSIS — M25552 Pain in left hip: Secondary | ICD-10-CM

## 2015-03-07 DIAGNOSIS — E559 Vitamin D deficiency, unspecified: Secondary | ICD-10-CM

## 2015-03-07 DIAGNOSIS — K219 Gastro-esophageal reflux disease without esophagitis: Secondary | ICD-10-CM

## 2015-03-07 DIAGNOSIS — R200 Anesthesia of skin: Secondary | ICD-10-CM | POA: Insufficient documentation

## 2015-03-07 LAB — COMPREHENSIVE METABOLIC PANEL
ALBUMIN: 4.2 g/dL (ref 3.5–5.2)
ALK PHOS: 45 U/L (ref 39–117)
ALT: 17 U/L (ref 0–35)
AST: 19 U/L (ref 0–37)
BUN: 11 mg/dL (ref 6–23)
CALCIUM: 9.3 mg/dL (ref 8.4–10.5)
CO2: 30 mEq/L (ref 19–32)
CREATININE: 0.76 mg/dL (ref 0.40–1.20)
Chloride: 105 mEq/L (ref 96–112)
GFR: 94.84 mL/min (ref >60.00)
Glucose, Bld: 82 mg/dL (ref 70–99)
POTASSIUM: 4.1 meq/L (ref 3.5–5.1)
Sodium: 140 mEq/L (ref 135–145)
Total Bilirubin: 1 mg/dL (ref 0.2–1.2)
Total Protein: 7.2 g/dL (ref 6.0–8.3)

## 2015-03-07 LAB — CBC WITH DIFFERENTIAL/PLATELET
BASOS PCT: 0.6 % (ref 0.0–3.0)
Basophils Absolute: 0 10*3/uL (ref 0.0–0.1)
Eosinophils Absolute: 0.1 10*3/uL (ref 0.0–0.7)
Eosinophils Relative: 1.2 % (ref 0.0–5.0)
HEMATOCRIT: 38.9 % (ref 36.0–46.0)
HEMOGLOBIN: 12.6 g/dL (ref 12.0–15.0)
LYMPHS PCT: 34.6 % (ref 12.0–46.0)
Lymphs Abs: 1.7 10*3/uL (ref 0.7–4.0)
MCHC: 32.3 g/dL (ref 30.0–36.0)
MCV: 89.2 fl (ref 78.0–100.0)
Monocytes Absolute: 0.4 10*3/uL (ref 0.1–1.0)
Monocytes Relative: 7.6 % (ref 3.0–12.0)
Neutro Abs: 2.8 10*3/uL (ref 1.4–7.7)
Neutrophils Relative %: 56 % (ref 43.0–77.0)
Platelets: 238 10*3/uL (ref 150.0–400.0)
RBC: 4.36 Mil/uL (ref 3.87–5.11)
RDW: 13.8 % (ref 11.5–15.5)
WBC: 5 10*3/uL (ref 4.0–10.5)

## 2015-03-07 LAB — VITAMIN D 25 HYDROXY (VIT D DEFICIENCY, FRACTURES): VITD: 28.44 ng/mL — ABNORMAL LOW (ref 30.00–100.00)

## 2015-03-07 LAB — LIPID PANEL
CHOLESTEROL: 184 mg/dL (ref 0–200)
HDL: 63.6 mg/dL (ref >39.00)
LDL Cholesterol: 106 mg/dL — ABNORMAL HIGH (ref 0–99)
NonHDL: 119.97
Total CHOL/HDL Ratio: 3
Triglycerides: 71 mg/dL (ref 0.0–149.0)
VLDL: 14.2 mg/dL (ref 0.0–40.0)

## 2015-03-07 LAB — VITAMIN B12: VITAMIN B 12: 215 pg/mL (ref 211–911)

## 2015-03-07 LAB — TSH: TSH: 1.56 u[IU]/mL (ref 0.35–4.50)

## 2015-03-07 LAB — POCT URINE PREGNANCY: Preg Test, Ur: NEGATIVE

## 2015-03-07 NOTE — Progress Notes (Signed)
Patient ID: Anna Burkemanda E Vasquez, female   DOB: 02/18/1986, 29 y.o.   MRN: [id number]   Subjective:    Patient ID: Anna Vasquez, female    DOB: 03/26/1985, 29 y.o.   MRN: [id number]  HPI  Patient with past history of hypothyroidism and GERD who comes in today to follow up on these issues as well as for a complete physical exam.  She reports she is doing well.  Is s/p carpal tunnel surgery.  Doing well.  Sleeping better.  No pain.  Tries to stay active.  No cardiac symptoms with increased activity or exertion.  No sob.  No acid reflux reported. No abdominal pain or cramping.  Bowels stable.  LMP 02/25/15.   Reports bilateral hip pain as outlined.  Present for at least two years.  Some clicking noise with certain movements.     Past Medical History  Diagnosis Date  . GERD (gastroesophageal reflux disease)   . Frequent headaches   . Hypothyroidism   . H/O febrile seizure   . Hx of migraines   . Pseudotumor cerebri   . PCOD (polycystic ovarian disease)   . Hypothyroidism   . Thoracic outlet syndrome     extra cervical ribs  . Vitamin D deficiency    Past Surgical History  Procedure Laterality Date  . Mouth surgery  1998  . Laparoscopic gastric sleeve resection     Family History  Problem Relation Age of Onset  . Arthritis Father   . Hypertension Father   . Sleep apnea Father   . Transient ischemic attack Father   . Breast cancer Paternal Aunt   . Mental illness Paternal Uncle   . Prostate cancer Paternal Grandfather    Social History   Social History  . Marital Status: Single    Spouse Name: N/A  . Number of Children: N/A  . Years of Education: N/A   Social History Main Topics  . Smoking status: Never Smoker   . Smokeless tobacco: Never Used  . Alcohol Use: 0.0 oz/week    0 Standard drinks or equivalent per week  . Drug Use: No  . Sexual Activity: Not Asked   Other Topics Concern  . None   Social History Narrative    Outpatient Encounter Prescriptions as of  03/07/2015  Medication Sig  . acetaminophen (TYLENOL) 325 MG tablet Take 650 mg by mouth every 6 (six) hours as needed for pain.  Marland Kitchen. SYNTHROID 112 MCG tablet TAKE 1 TABLET BY MOUTH DAILY  . [DISCONTINUED] amoxicillin-clavulanate (AUGMENTIN) 875-125 MG per tablet Take 1 tablet by mouth 2 (two) times daily.   No facility-administered encounter medications on file as of 03/07/2015.    Review of Systems  Constitutional: Negative for appetite change and unexpected weight change.  HENT: Negative for congestion and sinus pressure.   Eyes: Negative for pain and visual disturbance.  Respiratory: Negative for cough, chest tightness and shortness of breath.   Cardiovascular: Negative for chest pain, palpitations and leg swelling.  Gastrointestinal: Negative for nausea, vomiting, abdominal pain and diarrhea.  Genitourinary: Negative for dysuria and difficulty urinating.  Musculoskeletal: Negative for back pain and joint swelling.       Bilateral hip pain as outlined.    Skin: Negative for color change and rash.  Neurological: Negative for dizziness, light-headedness and headaches.  Hematological: Negative for adenopathy. Does not bruise/bleed easily.  Psychiatric/Behavioral: Negative for dysphoric mood and agitation.       Objective:    Physical Exam  Constitutional:  She is oriented to person, place, and time. She appears well-developed and well-nourished. No distress.  HENT:  Mouth/Throat: Oropharynx is clear and moist.  Eyes: Right eye exhibits no discharge. Left eye exhibits no discharge. No scleral icterus.  Neck: Neck supple. No thyromegaly present.  Cardiovascular: Normal rate and regular rhythm.   Pulmonary/Chest: Breath sounds normal. No accessory muscle usage. No tachypnea. No respiratory distress. She has no decreased breath sounds. She has no wheezes. She has no rhonchi. Right breast exhibits no inverted nipple, no mass, no nipple discharge and no tenderness (no axillary adenopathy).  Left breast exhibits no inverted nipple, no mass, no nipple discharge and no tenderness (no axilarry adenopathy).  Abdominal: Soft. Bowel sounds are normal. There is no tenderness.  Musculoskeletal: She exhibits no edema or tenderness.  Lymphadenopathy:    She has no cervical adenopathy.  Neurological: She is alert and oriented to person, place, and time.  Skin: Skin is warm. No rash noted. No erythema.  Psychiatric: She has a normal mood and affect. Her behavior is normal.    BP 118/70 mmHg  Pulse 68  Temp(Src) 98.1 F (36.7 C) (Oral)  Resp 18  Ht 5' 5.5" (1.664 m)  Wt 213 lb 12 oz (96.956 kg)  BMI 35.02 kg/m2  SpO2 97%  LMP 02/25/2015 (Exact Date) Wt Readings from Last 3 Encounters:  03/07/15 213 lb 12 oz (96.956 kg)  07/09/14 206 lb (93.441 kg)  07/08/14 205 lb 2 oz (93.044 kg)     Lab Results  Component Value Date   WBC 5.0 03/07/2015   HGB 12.6 03/07/2015   HCT 38.9 03/07/2015   PLT 238.0 03/07/2015   GLUCOSE 82 03/07/2015   CHOL 184 03/07/2015   TRIG 71.0 03/07/2015   HDL 63.60 03/07/2015   LDLCALC 106* 03/07/2015   ALT 17 03/07/2015   AST 19 03/07/2015   NA 140 03/07/2015   K 4.1 03/07/2015   CL 105 03/07/2015   CREATININE 0.76 03/07/2015   BUN 11 03/07/2015   CO2 30 03/07/2015   TSH 1.56 03/07/2015   INR 1.0 08/25/2012   HGBA1C 5.2 08/25/2012       Assessment & Plan:   Problem List Items Addressed This Visit    Bilateral hip pain    Bilateral hip pain as outlined.  Some clicking noise with rotation at the hip.  Check xray.  Further w/up pending results.        Relevant Orders   DG HIPS BILAT W OR W/O PELVIS 3-4 VIEWS (Completed)   Carpal tunnel syndrome    S/p surgery.  Sleeping better.  Doing well.        GERD (gastroesophageal reflux disease)    On no medication.  No symptoms.  EGD 10/24/12.        Health care maintenance    Physical today 03/07/15.   PAP 03/09/13 - negative.       Hypothyroidism    On thyroid replacement.  Follow tsh.         Relevant Orders   CBC with Differential/Platelet (Completed)   Comprehensive metabolic panel (Completed)   TSH (Completed)   Numbness of toes   Relevant Orders   Vitamin B12 (Completed)   Obesity (BMI 30-39.9)    Diet and exercise.  Follow.        Vitamin D deficiency    Recheck vitamin D level today.        Relevant Orders   VITAMIN D 25 Hydroxy (Vit-D Deficiency, Fractures) (Completed)  Other Visit Diagnoses    Routine general medical examination at a health care facility    -  Primary    Relevant Orders    POCT urine pregnancy (Completed)    Screening cholesterol level        Relevant Orders    Lipid panel (Completed)        Dale Chilchinbito, MD

## 2015-03-07 NOTE — Progress Notes (Signed)
Pre-visit discussion using our clinic review tool. No additional management support is needed unless otherwise documented below in the visit note.  

## 2015-03-09 ENCOUNTER — Encounter: Payer: Self-pay | Admitting: Internal Medicine

## 2015-03-09 DIAGNOSIS — Z0000 Encounter for general adult medical examination without abnormal findings: Secondary | ICD-10-CM | POA: Insufficient documentation

## 2015-03-09 DIAGNOSIS — G5600 Carpal tunnel syndrome, unspecified upper limb: Secondary | ICD-10-CM | POA: Insufficient documentation

## 2015-03-09 NOTE — Assessment & Plan Note (Signed)
Physical today 03/07/15.   PAP 03/09/13 - negative.

## 2015-03-09 NOTE — Assessment & Plan Note (Signed)
On no medication.  No symptoms.  EGD 10/24/12.

## 2015-03-09 NOTE — Assessment & Plan Note (Signed)
Recheck vitamin  D level today.  

## 2015-03-09 NOTE — Assessment & Plan Note (Signed)
Bilateral hip pain as outlined.  Some clicking noise with rotation at the hip.  Check xray.  Further w/up pending results.

## 2015-03-09 NOTE — Assessment & Plan Note (Signed)
Diet and exercise.  Follow.  

## 2015-03-09 NOTE — Assessment & Plan Note (Signed)
On thyroid replacement.  Follow tsh.  

## 2015-03-09 NOTE — Assessment & Plan Note (Signed)
S/p surgery.  Sleeping better.  Doing well.

## 2015-04-14 ENCOUNTER — Encounter: Payer: Self-pay | Admitting: Internal Medicine

## 2015-04-17 ENCOUNTER — Other Ambulatory Visit: Payer: Self-pay | Admitting: Internal Medicine

## 2015-05-07 ENCOUNTER — Telehealth: Payer: Self-pay | Admitting: Internal Medicine

## 2015-05-07 ENCOUNTER — Encounter: Payer: Self-pay | Admitting: Internal Medicine

## 2015-05-07 ENCOUNTER — Ambulatory Visit (INDEPENDENT_AMBULATORY_CARE_PROVIDER_SITE_OTHER): Payer: BLUE CROSS/BLUE SHIELD | Admitting: Family Medicine

## 2015-05-07 ENCOUNTER — Encounter: Payer: Self-pay | Admitting: Family Medicine

## 2015-05-07 ENCOUNTER — Ambulatory Visit
Admission: EM | Admit: 2015-05-07 | Discharge: 2015-05-07 | Disposition: A | Discharge status: Home / Self Care | Payer: BLUE CROSS/BLUE SHIELD | Attending: Family Medicine | Admitting: Family Medicine

## 2015-05-07 ENCOUNTER — Encounter: Payer: Self-pay | Admitting: *Deleted

## 2015-05-07 VITALS — BP 112/72 | HR 115 | Temp 98.1°F | Ht 66.0 in | Wt 196.4 lb

## 2015-05-07 DIAGNOSIS — K529 Noninfective gastroenteritis and colitis, unspecified: Secondary | ICD-10-CM | POA: Diagnosis not present

## 2015-05-07 DIAGNOSIS — R197 Diarrhea, unspecified: Secondary | ICD-10-CM | POA: Diagnosis not present

## 2015-05-07 LAB — CBC WITH DIFFERENTIAL/PLATELET
BASOS ABS: 0 10*3/uL (ref 0–0.1)
Basophils Relative: 0 %
EOS PCT: 0 %
Eosinophils Absolute: 0 10*3/uL (ref 0–0.7)
HCT: 43.4 % (ref 35.0–47.0)
Hemoglobin: 14.4 g/dL (ref 12.0–16.0)
LYMPHS PCT: 5 %
Lymphs Abs: 0.6 10*3/uL — ABNORMAL LOW (ref 1.0–3.6)
MCH: 29.2 pg (ref 26.0–34.0)
MCHC: 33.1 g/dL (ref 32.0–36.0)
MCV: 88.2 fL (ref 80.0–100.0)
Monocytes Absolute: 0.4 10*3/uL (ref 0.2–0.9)
Monocytes Relative: 3 %
Neutro Abs: 10.4 10*3/uL — ABNORMAL HIGH (ref 1.4–6.5)
Neutrophils Relative %: 92 %
PLATELETS: 219 10*3/uL (ref 150–440)
RBC: 4.92 MIL/uL (ref 3.80–5.20)
RDW: 13.8 % (ref 11.5–14.5)
WBC: 11.4 10*3/uL — AB (ref 3.6–11.0)

## 2015-05-07 LAB — COMPREHENSIVE METABOLIC PANEL
ALBUMIN: 4.1 g/dL (ref 3.5–5.0)
ALT: 13 U/L — ABNORMAL LOW (ref 14–54)
AST: 22 U/L (ref 15–41)
Alkaline Phosphatase: 45 U/L (ref 38–126)
Anion gap: 11 (ref 5–15)
BILIRUBIN TOTAL: 2.6 mg/dL — AB (ref 0.3–1.2)
BUN: 14 mg/dL (ref 6–20)
CALCIUM: 8.7 mg/dL — AB (ref 8.9–10.3)
CO2: 21 mmol/L — ABNORMAL LOW (ref 22–32)
Chloride: 102 mmol/L (ref 101–111)
Creatinine, Ser: 0.77 mg/dL (ref 0.44–1.00)
GFR calc Af Amer: >60 mL/min (ref >60)
GLUCOSE: 113 mg/dL — AB (ref 65–99)
POTASSIUM: 3.7 mmol/L (ref 3.5–5.1)
Sodium: 134 mmol/L — ABNORMAL LOW (ref 135–145)
TOTAL PROTEIN: 7.9 g/dL (ref 6.5–8.1)

## 2015-05-07 MED ORDER — SODIUM CHLORIDE 0.9 % IV BOLUS (SEPSIS)
1000.0000 mL | Freq: Once | INTRAVENOUS | Status: AC
  Administered 2015-05-07: 1000 mL via INTRAVENOUS

## 2015-05-07 MED ORDER — ONDANSETRON 8 MG PO TBDP: 8.0000 mg | ORAL_TABLET | Freq: Two times a day (BID) | ORAL | Status: DC

## 2015-05-07 MED ORDER — ONDANSETRON 8 MG PO TBDP
8.0000 mg | ORAL_TABLET | Freq: Once | ORAL | Status: AC
  Administered 2015-05-07: 8 mg via ORAL

## 2015-05-07 NOTE — Patient Instructions (Signed)
Nice to meet you. You are dehydrated. This is likely related to a viral or bacterial intestinal infection. Given her dehydration and elevated heart rate would benefit from IV fluids and blood work. We discussed having him go to the emergency room for this, though he would prefer to be evaluated in urgent care. I would go to the [facility] that is part of [facility]. If in route you develop worsening abdominal pain, chest pain, shortness of breath, palpitations, or any new or change in symptoms please seek medical attention.

## 2015-05-07 NOTE — Progress Notes (Signed)
Patient ID: Anna Vasquez, female   DOB: [birth year], 30 y.o.   MRN: [id number]  Marikay Alar, MD Phone: [phone]  Anna Vasquez is a 30 y.o. female who presents today for same-day visit.  Patient notes starting yesterday she developed nausea mid day. She then had sudden onset periumbilical discomfort. This discomfort has not radiated anywhere. The discomfort has eased off today and is only mild at this time She notes mild discomfort when moving. Last night she started to develop liquidy diarrhea with blood mixed in. It is not pure or gross blood. She has noted some vomiting yesterday. She has been trying to drink water and Pedialyte. She feels dehydrated. Notes her blood pressure yesterday was 90/60. She notes mild lightheadedness with this. No fevers. She did bring in a picture of her stool and there is blood tinged water and blood mixed with stool.  PMH: nonsmoker.   ROS see history of present illness  Objective  Physical Exam Filed Vitals:   05/07/15 1110  BP: 112/72  Pulse: 115  Temp: 98.1 F (36.7 C)   Laying blood pressure 104/82 pulse 94 Sitting blood pressure 106/74 pulse 105 Standing blood pressure 98/72 pulse 134  Physical Exam  Constitutional: No distress.  HENT:  Head: Normocephalic and atraumatic.  Right Ear: External ear normal.  Left Ear: External ear normal.  Mouth/Throat: No oropharyngeal exudate.  Dry mucous membranes  Eyes: Conjunctivae are normal. Pupils are equal, round, and reactive to light.  Cardiovascular: Exam reveals no gallop and no friction rub.   No murmur heard. Tachycardic  Pulmonary/Chest: Effort normal and breath sounds normal. No respiratory distress. She has no wheezes. She has no rales.  Abdominal: Soft. Bowel sounds are normal. She exhibits no distension. There is tenderness (minimal periumbilical tenderness, no right lower quadrant tenderness, no tenderness elsewhere in her abdomen). There is no rebound and no guarding.    Musculoskeletal: She exhibits no edema.  Neurological: She is alert. Gait normal.  Skin: Skin is warm and dry. She is not diaphoretic.     Assessment/Plan: Please see individual problem list.  Diarrhea Suspect patient's illness is likely related to viral gastroenteritis versus bacterial intestinal illness. Patient had minimal discomfort on palpation of the periumbilical area, though no lower abdominal or upper abdominal pain on palpation. Doubt appendix as cause. She appears dehydrated. Given dehydration and tachycardia I discussed evaluation in the emergency room for this issue, though the patient was hesitant and she opted for evaluation at urgent care for this issue and IV fluids and lab work. She will go to the [facility] urgent care. CMA called and informed them that the patient was on her way. She was given precautions to call EMS en route if they occur.     Marikay Alar

## 2015-05-07 NOTE — ED Notes (Signed)
IV d/c'd by S. Audiological scientist. Verbal instructions given by Dr. Judd Gaudier

## 2015-05-07 NOTE — Assessment & Plan Note (Signed)
Suspect patient's illness is likely related to viral gastroenteritis versus bacterial intestinal illness. Patient had minimal discomfort on palpation of the periumbilical area, though no lower abdominal or upper abdominal pain on palpation. Doubt appendix as cause. She appears dehydrated. Given dehydration and tachycardia I discussed evaluation in the emergency room for this issue, though the patient was hesitant and she opted for evaluation at urgent care for this issue and IV fluids and lab work. She will go to the [facility] urgent care. CMA called and informed them that the patient was on her way. She was given precautions to call EMS en route if they occur.

## 2015-05-07 NOTE — Telephone Encounter (Signed)
Patient states in her email, Yesterday afternoon I experienced sudden onset nausea and abdominal pain (burning, knife-like pain in center of stomach) followed by watery, bloody diarrhea, vomiting, weakness, and mild fever (99.1). I have been battling these symptoms ever since, however the abdominal pain has lessened and diarrhea/vomiting less frequent. I'm making an effort to stay hydrated with water and pedialyte. She ate [employer] for lunch and patient never eats there. She feels that this is the cause of the symptom, but her mother is worried because pt is having blood in her stool.

## 2015-05-07 NOTE — ED Provider Notes (Signed)
CSN: [id number]     Arrival date & time 05/07/15  1157 History   First MD Initiated Contact with Patient 05/07/15 1233     Chief Complaint  Patient presents with  . Abdominal Pain  . Diarrhea  . Nausea   (Consider location/radiation/quality/duration/timing/severity/associated sxs/prior Treatment) Patient is a 30 y.o. female presenting with vomiting. The history is provided by the patient.  Emesis Severity:  Moderate Duration:  24 hours Timing:  Constant Quality:  Stomach contents Progression:  Unchanged Chronicity:  New Recent urination:  Decreased Associated symptoms: chills and diarrhea   Associated symptoms: no abdominal pain, no cough, no fever, no headaches, no myalgias, no sore throat and no URI   Risk factors: suspect food intake   Risk factors: no alcohol use, no diabetes, not pregnant now, no prior abdominal surgery, no sick contacts and no travel to endemic areas     Past Medical History  Diagnosis Date  . GERD (gastroesophageal reflux disease)   . Frequent headaches   . Hypothyroidism   . H/O febrile seizure   . Hx of migraines   . Pseudotumor cerebri   . PCOD (polycystic ovarian disease)   . Hypothyroidism   . Thoracic outlet syndrome     extra cervical ribs  . Vitamin D deficiency    Past Surgical History  Procedure Laterality Date  . Mouth surgery  1998  . Laparoscopic gastric sleeve resection     Family History  Problem Relation Age of Onset  . Arthritis Father   . Hypertension Father   . Sleep apnea Father   . Transient ischemic attack Father   . Breast cancer Paternal Aunt   . Mental illness Paternal Uncle   . Prostate cancer Paternal Grandfather    Social History  Substance Use Topics  . Smoking status: Never Smoker   . Smokeless tobacco: Never Used  . Alcohol Use: 0.0 oz/week    0 Standard drinks or equivalent per week   OB History    No data available     Review of Systems  Constitutional: Positive for chills.  HENT: Negative for  sore throat.   Gastrointestinal: Positive for vomiting and diarrhea. Negative for abdominal pain.  Musculoskeletal: Negative for myalgias.  Neurological: Negative for headaches.    Allergies  Tetracyclines & related  Home Medications   Prior to Admission medications   Medication Sig Start Date End Date Taking? Authorizing Provider  SYNTHROID 112 MCG tablet TAKE 1 TABLET BY MOUTH DAILY 04/17/15  Yes [place], MD  acetaminophen (TYLENOL) 325 MG tablet Take 650 mg by mouth every 6 (six) hours as needed for pain.    Historical Provider, MD  ondansetron (ZOFRAN ODT) 8 MG disintegrating tablet Take 1 tablet (8 mg total) by mouth 2 (two) times daily. 05/07/15   Payton Mccallum, MD   Meds Ordered and Administered this Visit   Medications  ondansetron (ZOFRAN-ODT) disintegrating tablet 8 mg (8 mg Oral Given 05/07/15 1242)  sodium chloride 0.9 % bolus 1,000 mL (1,000 mLs Intravenous Given 05/07/15 1258)    BP 109/66 mmHg  Pulse 66  Temp(Src) 98 F (36.7 C) (Tympanic)  Resp 16  Ht  (1.651 m)  Wt 196 lb (88.905 kg)  BMI 32.62 kg/m2  SpO2 100%  LMP 03/27/2015 No data found.   Physical Exam  Constitutional: She appears well-developed and well-nourished. No distress.  Abdominal: Soft. Bowel sounds are normal. She exhibits no distension and no mass. There is no tenderness. There is no rebound  and no guarding.  Skin: She is not diaphoretic.  Nursing note and vitals reviewed.   ED Course  Procedures (including critical care time)  Labs Review Labs Reviewed  COMPREHENSIVE METABOLIC PANEL - Abnormal; Notable for the following:    Sodium 134 (*)    CO2 21 (*)    Glucose, Bld 113 (*)    Calcium 8.7 (*)    ALT 13 (*)    Total Bilirubin 2.6 (*)    All other components within normal limits  CBC WITH DIFFERENTIAL/PLATELET - Abnormal; Notable for the following:    WBC 11.4 (*)    Neutro Abs 10.4 (*)    Lymphs Abs 0.6 (*)    All other components within normal limits     Imaging Review No results found.   Visual Acuity Review  Right Eye Distance:   Left Eye Distance:   Bilateral Distance:    Right Eye Near:   Left Eye Near:    Bilateral Near:         MDM   1. Gastroenteritis, acute    Discharge Medication List as of 05/07/2015  2:42 PM    START taking these medications   Details  ondansetron (ZOFRAN ODT) 8 MG disintegrating tablet Take 1 tablet (8 mg total) by mouth 2 (two) times daily., Starting 05/07/2015, Until Discontinued, Normal       1. Lab results and diagnosis reviewed with patient 2. Patient given 1L NS IVF and zofran  po x1 with improvement of symptoms; tolerating po fluids prior to discharge 3. rx as per orders above; reviewed possible side effects, interactions, risks and benefits  4. Recommend supportive treatment with clear liquids, then advance slowly as tolerated 5.  Follow-up prn if symptoms worsen or don't improve    Payton Mccallum, MD 05/07/15 2044

## 2015-05-07 NOTE — Progress Notes (Signed)
Pre visit review using our clinic review tool, if applicable. No additional management support is needed unless otherwise documented below in the visit note. 

## 2015-05-07 NOTE — Telephone Encounter (Signed)
Seen in the office

## 2015-05-07 NOTE — ED Notes (Signed)
Mid abd. Pain, nausea, and diarrhea x24 hrs.

## 2015-05-07 NOTE — Telephone Encounter (Signed)
FYI

## 2015-06-05 ENCOUNTER — Ambulatory Visit (INDEPENDENT_AMBULATORY_CARE_PROVIDER_SITE_OTHER): Payer: BLUE CROSS/BLUE SHIELD | Admitting: Internal Medicine

## 2015-06-05 ENCOUNTER — Encounter: Payer: Self-pay | Admitting: Internal Medicine

## 2015-06-05 VITALS — BP 100/68 | HR 67 | Temp 98.2°F | Resp 20 | Ht 66.0 in | Wt 200.8 lb

## 2015-06-05 DIAGNOSIS — G43809 Other migraine, not intractable, without status migrainosus: Secondary | ICD-10-CM

## 2015-06-05 DIAGNOSIS — Z308 Encounter for other contraceptive management: Secondary | ICD-10-CM | POA: Diagnosis not present

## 2015-06-05 DIAGNOSIS — G932 Benign intracranial hypertension: Secondary | ICD-10-CM | POA: Diagnosis not present

## 2015-06-05 DIAGNOSIS — E039 Hypothyroidism, unspecified: Secondary | ICD-10-CM | POA: Diagnosis not present

## 2015-06-05 DIAGNOSIS — Z30019 Encounter for initial prescription of contraceptives, unspecified: Secondary | ICD-10-CM

## 2015-06-05 NOTE — Progress Notes (Signed)
Patient ID: JAYLEEN SCAGLIONE, female   DOB: 1986-01-22, 30 y.o.   MRN: [id number]   Subjective:    Patient ID: Angela Burke, female    DOB: 1985/08/18, 30 y.o.   MRN: [id number]  HPI  Patient here for a scheduled follow up and to discuss birth control.  She reports she is doing well.  Feels good.  Has a history of pseudotumor cerebri.  Also has a history of migraines.  For the last few years, has not had issues with the increased pressure or headaches.  Does not smoke.  Is not hypertensive.  Due to get married next month and wants to start on something before she gets married.  Planning to have children in one year.  Discussed options.  She prefers to try ocp's.     Past Medical History  Diagnosis Date  . GERD (gastroesophageal reflux disease)   . Frequent headaches   . Hypothyroidism   . H/O febrile seizure   . Hx of migraines   . Pseudotumor cerebri   . PCOD (polycystic ovarian disease)   . Hypothyroidism   . Thoracic outlet syndrome     extra cervical ribs  . Vitamin D deficiency    Past Surgical History  Procedure Laterality Date  . Mouth surgery  1998  . Laparoscopic gastric sleeve resection     Family History  Problem Relation Age of Onset  . Arthritis Father   . Hypertension Father   . Sleep apnea Father   . Transient ischemic attack Father   . Breast cancer Paternal Aunt   . Mental illness Paternal Uncle   . Prostate cancer Paternal Grandfather    Social History   Social History  . Marital Status: Single    Spouse Name: N/A  . Number of Children: N/A  . Years of Education: N/A   Social History Main Topics  . Smoking status: Never Smoker   . Smokeless tobacco: Never Used  . Alcohol Use: 0.0 oz/week    0 Standard drinks or equivalent per week  . Drug Use: No  . Sexual Activity: Not Asked   Other Topics Concern  . None   Social History Narrative    Outpatient Encounter Prescriptions as of 06/05/2015  Medication Sig  . acetaminophen (TYLENOL) 325 MG  tablet Take 650 mg by mouth every 6 (six) hours as needed for pain.  Marland Kitchen SYNTHROID 112 MCG tablet TAKE 1 TABLET BY MOUTH DAILY  . [DISCONTINUED] ondansetron (ZOFRAN ODT) 8 MG disintegrating tablet Take 1 tablet (8 mg total) by mouth 2 (two) times daily.   No facility-administered encounter medications on file as of 06/05/2015.    Review of Systems  Constitutional: Negative for appetite change and unexpected weight change.  HENT: Negative for congestion and sinus pressure.   Eyes: Negative for pain and visual disturbance.  Respiratory: Negative for cough, chest tightness and shortness of breath.   Cardiovascular: Negative for chest pain and palpitations.  Gastrointestinal: Negative for nausea, vomiting, abdominal pain and diarrhea.  Genitourinary: Negative for dysuria and difficulty urinating.  Musculoskeletal: Negative for back pain and joint swelling.  Skin: Negative for color change and rash.  Neurological: Negative for dizziness, light-headedness and headaches.  Psychiatric/Behavioral: Negative for dysphoric mood and agitation.       Objective:    Physical Exam  Constitutional: She appears well-developed and well-nourished. No distress.  HENT:  Nose: Nose normal.  Mouth/Throat: Oropharynx is clear and moist.  Eyes: Conjunctivae are normal. Right eye exhibits no  discharge. Left eye exhibits no discharge.  Neck: Neck supple. No thyromegaly present.  Cardiovascular: Normal rate and regular rhythm.   Pulmonary/Chest: Breath sounds normal. No respiratory distress. She has no wheezes.  Abdominal: Soft. Bowel sounds are normal. There is no tenderness.  Musculoskeletal: She exhibits no edema or tenderness.  Lymphadenopathy:    She has no cervical adenopathy.  Skin: No rash noted. No erythema.  Psychiatric: She has a normal mood and affect. Her behavior is normal.    BP 100/68 mmHg  Pulse 67  Temp(Src) 98.2 F (36.8 C) (Oral)  Resp 20  Ht 5\' 6"  (1.676 m)  Wt 200 lb 12 oz (91.06  kg)  BMI 32.42 kg/m2  SpO2 99%  LMP 03/27/2015 Wt Readings from Last 3 Encounters:  06/05/15 200 lb 12 oz (91.06 kg)  05/07/15 196 lb (88.905 kg)  05/07/15 196 lb 6.4 oz (89.086 kg)     Lab Results  Component Value Date   WBC 11.4* 05/07/2015   HGB 14.4 05/07/2015   HCT 43.4 05/07/2015   PLT 219 05/07/2015   GLUCOSE 113* 05/07/2015   CHOL 184 03/07/2015   TRIG 71.0 03/07/2015   HDL 63.60 03/07/2015   LDLCALC 106* 03/07/2015   ALT 13* 05/07/2015   AST 22 05/07/2015   NA 134* 05/07/2015   K 3.7 05/07/2015   CL 102 05/07/2015   CREATININE 0.77 05/07/2015   BUN 14 05/07/2015   CO2 21* 05/07/2015   TSH 1.56 03/07/2015   INR 1.0 08/25/2012   HGBA1C 5.2 08/25/2012       Assessment & Plan:   Problem List Items Addressed This Visit    Encounter for female birth control    Discussed birth control options.  She desires ocp's.   Start low dose ocp's. Follow.  Follow pressure.  Discussed side effects and possible risk of ocp's.  She elects to start.        Hypothyroidism    On synthroid.  Follow tsh.        Migraine    Sees neurology.  No headaches recently.  Currently doing well.  Discussed with neurology.  Does not smoke.  No h/o hypertension.  Ok to start low dose ocp.        Pseudotumor cerebri - Primary    No headaches now.  Not an issue now.  Follow.            Dale DurhamSCOTT, Hunt Zajicek, MD

## 2015-06-05 NOTE — Progress Notes (Signed)
Pre visit review using our clinic review tool, if applicable. No additional management support is needed unless otherwise documented below in the visit note. 

## 2015-06-08 ENCOUNTER — Encounter: Payer: Self-pay | Admitting: Internal Medicine

## 2015-06-08 ENCOUNTER — Other Ambulatory Visit: Payer: Self-pay | Admitting: Internal Medicine

## 2015-06-08 DIAGNOSIS — Z30019 Encounter for initial prescription of contraceptives, unspecified: Secondary | ICD-10-CM | POA: Insufficient documentation

## 2015-06-08 MED ORDER — NORETHIN-ETH ESTRAD-FE BIPHAS 1 MG-10 MCG / 10 MCG PO TABS: 1.0000 | ORAL_TABLET | Freq: Every day | ORAL | Status: DC

## 2015-06-08 NOTE — Progress Notes (Signed)
Sent in rx for lo loestrin.  One pace with 5 refills.

## 2015-06-08 NOTE — Assessment & Plan Note (Signed)
Discussed birth control options.  She desires ocp's.   Start low dose ocp's. Follow.  Follow pressure.  Discussed side effects and possible risk of ocp's.  She elects to start.

## 2015-06-08 NOTE — Assessment & Plan Note (Signed)
No headaches now.  Not an issue now.  Follow.

## 2015-06-08 NOTE — Assessment & Plan Note (Signed)
Sees neurology.  No headaches recently.  Currently doing well.  Discussed with neurology.  Does not smoke.  No h/o hypertension.  Ok to start low dose ocp.

## 2015-06-08 NOTE — Assessment & Plan Note (Signed)
On synthroid.  Follow tsh.   

## 2015-06-27 ENCOUNTER — Encounter: Payer: Self-pay | Admitting: Internal Medicine

## 2015-06-27 ENCOUNTER — Ambulatory Visit (INDEPENDENT_AMBULATORY_CARE_PROVIDER_SITE_OTHER): Payer: BLUE CROSS/BLUE SHIELD | Admitting: Internal Medicine

## 2015-06-27 VITALS — BP 98/65 | HR 67 | Temp 98.0°F | Ht 66.0 in | Wt 200.0 lb

## 2015-06-27 DIAGNOSIS — J020 Streptococcal pharyngitis: Secondary | ICD-10-CM | POA: Insufficient documentation

## 2015-06-27 LAB — POCT RAPID STREP A (OFFICE): RAPID STREP A SCREEN: POSITIVE — AB

## 2015-06-27 MED ORDER — CEFTRIAXONE SODIUM 1 G IJ SOLR
1.0000 g | Freq: Once | INTRAMUSCULAR | Status: AC
  Administered 2015-06-27: 1 g via INTRAMUSCULAR

## 2015-06-27 MED ORDER — CEFTRIAXONE SODIUM 1 G IJ SOLR: 1.0000 g | INTRAMUSCULAR | Status: DC

## 2015-06-27 MED ORDER — CEFTRIAXONE SODIUM 1 G IJ SOLR: 1.0000 g | Freq: Once | INTRAMUSCULAR | Status: DC

## 2015-06-27 MED ORDER — AMOXICILLIN 500 MG PO TABS: 500.0000 mg | ORAL_TABLET | Freq: Two times a day (BID) | ORAL | Status: DC

## 2015-06-27 NOTE — Progress Notes (Signed)
Pre visit review using our clinic review tool, if applicable. No additional management support is needed unless otherwise documented below in the visit note. 

## 2015-06-27 NOTE — Patient Instructions (Addendum)
Today, we administered Ceftriaxone, an antibiotic to help eliminate your strep throat infection. Please start Amoxicillin tomorrow. Please let us know if your symptoms are not improving. Use Ibuprofen as needed for pain.  Strep Throat Strep throat is a bacterial infection of the throat. Your health care provider may call the infection tonsillitis or pharyngitis, depending on whether there is swelling in the tonsils or at the back of the throat. Strep throat is most common during the cold months of the year in children who are 905-31 years of age, but it can happen during any season in people of any age. This infection is spread from person to person (contagious) through coughing, sneezing, or close contact. CAUSES Strep throat is caused by the bacteria called Streptococcus pyogenes. RISK FACTORS This condition is more likely to develop in:  People who spend time in crowded places where the infection can spread easily.  People who have close contact with someone who has strep throat. SYMPTOMS Symptoms of this condition include:  Fever or chills.   Redness, swelling, or pain in the tonsils or throat.  Pain or difficulty when swallowing.  White or yellow spots on the tonsils or throat.  Swollen, tender glands in the neck or under the jaw.  Red rash all over the body (rare). DIAGNOSIS This condition is diagnosed by performing a rapid strep test or by taking a swab of your throat (throat culture test). Results from a rapid strep test are usually ready in a few minutes, but throat culture test results are available after one or two days. TREATMENT This condition is treated with antibiotic medicine. HOME CARE INSTRUCTIONS Medicines  Take over-the-counter and prescription medicines only as told by your health care provider.  Take your antibiotic as told by your health care provider. Do not stop taking the antibiotic even if you start to feel better.  Have family members who also have a  sore throat or fever tested for strep throat. They may need antibiotics if they have the strep infection. Eating and Drinking  Do not share food, drinking cups, or personal items that could cause the infection to spread to other people.  If swallowing is difficult, try eating soft foods until your sore throat feels better.  Drink enough fluid to keep your urine clear or pale yellow. General Instructions  Gargle with a salt-water mixture 3-4 times per day or as needed. To make a salt-water mixture, completely dissolve -1 tsp of salt in 1 cup of warm water.  Make sure that all household members wash their hands well.  Get plenty of rest.  Stay home from school or work until you have been taking antibiotics for 24 hours.  Keep all follow-up visits as told by your health care provider. This is important. SEEK MEDICAL CARE IF:  The glands in your neck continue to get bigger.  You develop a rash, cough, or earache.  You cough up a thick liquid that is green, yellow-brown, or bloody.  You have pain or discomfort that does not get better with medicine.  Your problems seem to be getting worse rather than better.  You have a fever. SEEK IMMEDIATE MEDICAL CARE IF:  You have new symptoms, such as vomiting, severe headache, stiff or painful neck, chest pain, or shortness of breath.  You have severe throat pain, drooling, or changes in your voice.  You have swelling of the neck, or the skin on the neck becomes red and tender.  You have signs of dehydration, such  as fatigue, dry mouth, and decreased urination.  You become increasingly sleepy, or you cannot wake up completely.  Your joints become red or painful.   This information is not intended to replace advice given to you by your health care provider. Make sure you discuss any questions you have with your health care provider.   Document Released: 03/05/2000 Document Revised: 11/27/2014 Document Reviewed: 07/01/2014 Elsevier  Interactive Patient Education [employer]

## 2015-06-27 NOTE — Assessment & Plan Note (Signed)
Exam and rapid strep consistent with strep pharyngitis. Will give IM Ceftriaxone 1gm and then start Amoxicillin 500mg  po bid x 10 days. Use Ibuprofen for pain as needed. Follow up if symptoms not improving. Return precautions given.

## 2015-06-27 NOTE — Addendum Note (Signed)
Addended by: Marchia MeiersEASTWOOD, Raetta Agostinelli M on: 06/27/2015 10:06 AM   Modules accepted: Orders

## 2015-06-27 NOTE — Progress Notes (Signed)
Subjective:    Patient ID: Anna Vasquez, female    DOB: 10/09/1985, 30 y.o.   MRN: [id number]  HPI  29YO female presents for acute visit.  Sore throat. Started yesterday. Pus on tonsils noted. Some headache. No fever, chills. No nausea. Getting married this weekend. Not taking anything for symptoms.  Wt Readings from Last 3 Encounters:  06/27/15 200 lb (90.719 kg)  06/05/15 200 lb 12 oz (91.06 kg)  05/07/15 196 lb (88.905 kg)   BP Readings from Last 3 Encounters:  06/27/15 98/65  06/05/15 100/68  05/07/15 109/66    Past Medical History  Diagnosis Date  . GERD (gastroesophageal reflux disease)   . Frequent headaches   . Hypothyroidism   . H/O febrile seizure   . Hx of migraines   . Pseudotumor cerebri   . PCOD (polycystic ovarian disease)   . Hypothyroidism   . Thoracic outlet syndrome     extra cervical ribs  . Vitamin D deficiency    Family History  Problem Relation Age of Onset  . Arthritis Father   . Hypertension Father   . Sleep apnea Father   . Transient ischemic attack Father   . Breast cancer Paternal Aunt   . Mental illness Paternal Uncle   . Prostate cancer Paternal Grandfather    Past Surgical History  Procedure Laterality Date  . Mouth surgery  1998  . Laparoscopic gastric sleeve resection     Social History   Social History  . Marital Status: Single    Spouse Name: N/A  . Number of Children: N/A  . Years of Education: N/A   Social History Main Topics  . Smoking status: Never Smoker   . Smokeless tobacco: Never Used  . Alcohol Use: 0.0 oz/week    0 Standard drinks or equivalent per week  . Drug Use: No  . Sexual Activity: Not Asked   Other Topics Concern  . None   Social History Narrative    Review of Systems  Constitutional: Negative for fever, chills, appetite change, fatigue and unexpected weight change.  HENT: Positive for sore throat. Negative for congestion, ear discharge, ear pain, facial swelling, hearing loss,  mouth sores, nosebleeds, postnasal drip, rhinorrhea, sinus pressure, sneezing, tinnitus, trouble swallowing and voice change.   Eyes: Negative for pain, discharge, redness and visual disturbance.  Respiratory: Negative for cough, chest tightness, shortness of breath, wheezing and stridor.   Cardiovascular: Negative for chest pain, palpitations and leg swelling.  Gastrointestinal: Negative for nausea, vomiting, abdominal pain, diarrhea and constipation.  Musculoskeletal: Negative for myalgias, arthralgias, neck pain and neck stiffness.  Skin: Negative for color change and rash.  Neurological: Positive for headaches. Negative for dizziness, weakness and light-headedness.  Hematological: Positive for adenopathy. Does not bruise/bleed easily.  Psychiatric/Behavioral: Negative for dysphoric mood. The patient is not nervous/anxious.        Objective:    BP 98/65 mmHg  Pulse 67  Temp(Src) 98 F (36.7 C) (Oral)  Ht  (1.676 m)  Wt 200 lb (90.719 kg)  BMI 32.30 kg/m2  SpO2 100% Physical Exam  Constitutional: She is oriented to person, place, and time. She appears well-developed and well-nourished. No distress.  HENT:  Head: Normocephalic and atraumatic.  Right Ear: External ear normal.  Left Ear: External ear normal.  Nose: Nose normal.  Mouth/Throat: Mucous membranes are normal. Oropharyngeal exudate and posterior oropharyngeal erythema present.  Eyes: Conjunctivae are normal. Pupils are equal, round, and reactive to light. Right eye exhibits  no discharge. Left eye exhibits no discharge. No scleral icterus.  Neck: Normal range of motion. Neck supple. No tracheal deviation present. No thyromegaly present.  Cardiovascular: Normal rate, regular rhythm, normal heart sounds and intact distal pulses.  Exam reveals no gallop and no friction rub.   No murmur heard. Pulmonary/Chest: Effort normal and breath sounds normal. No respiratory distress. She has no wheezes. She has no rales. She  exhibits no tenderness.  Musculoskeletal: Normal range of motion. She exhibits no edema or tenderness.  Lymphadenopathy:    She has cervical adenopathy.       Right cervical: Superficial cervical adenopathy present.       Left cervical: Superficial cervical adenopathy present.  Neurological: She is alert and oriented to person, place, and time. No cranial nerve deficit. She exhibits normal muscle tone. Coordination normal.  Skin: Skin is warm and dry. No rash noted. She is not diaphoretic. No erythema. No pallor.  Psychiatric: She has a normal mood and affect. Her behavior is normal. Judgment and thought content normal.          Assessment & Plan:   Problem List Items Addressed This Visit      Unprioritized   Streptococcal sore throat - Primary    Exam and rapid strep consistent with strep pharyngitis. Will give IM Ceftriaxone 1gm and then start Amoxicillin 500mg  po bid x 10 days. Use Ibuprofen for pain as needed. Follow up if symptoms not improving. Return precautions given.      Relevant Orders   POCT rapid strep A       Return if symptoms worsen or fail to improve.  Ronna PolioJennifer Sherri Levenhagen, MD Internal Medicine [facility]

## 2015-11-14 ENCOUNTER — Other Ambulatory Visit: Payer: Self-pay | Admitting: Internal Medicine

## 2016-01-22 DIAGNOSIS — G5602 Carpal tunnel syndrome, left upper limb: Secondary | ICD-10-CM | POA: Diagnosis not present

## 2016-02-03 ENCOUNTER — Other Ambulatory Visit: Payer: Self-pay | Admitting: Internal Medicine

## 2016-02-05 ENCOUNTER — Ambulatory Visit (INDEPENDENT_AMBULATORY_CARE_PROVIDER_SITE_OTHER): Payer: BLUE CROSS/BLUE SHIELD | Admitting: Internal Medicine

## 2016-02-05 ENCOUNTER — Encounter: Payer: Self-pay | Admitting: Internal Medicine

## 2016-02-05 DIAGNOSIS — E039 Hypothyroidism, unspecified: Secondary | ICD-10-CM

## 2016-02-05 DIAGNOSIS — G43809 Other migraine, not intractable, without status migrainosus: Secondary | ICD-10-CM

## 2016-02-05 DIAGNOSIS — L989 Disorder of the skin and subcutaneous tissue, unspecified: Secondary | ICD-10-CM

## 2016-02-05 DIAGNOSIS — G932 Benign intracranial hypertension: Secondary | ICD-10-CM | POA: Diagnosis not present

## 2016-02-05 DIAGNOSIS — E669 Obesity, unspecified: Secondary | ICD-10-CM

## 2016-02-05 DIAGNOSIS — E282 Polycystic ovarian syndrome: Secondary | ICD-10-CM | POA: Diagnosis not present

## 2016-02-05 DIAGNOSIS — G5600 Carpal tunnel syndrome, unspecified upper limb: Secondary | ICD-10-CM

## 2016-02-05 MED ORDER — TRIAMCINOLONE ACETONIDE 0.1 % EX CREA: 1.0000 "application " | TOPICAL_CREAM | Freq: Two times a day (BID) | CUTANEOUS | 0 refills | Status: DC

## 2016-02-05 MED ORDER — NORETHIN ACE-ETH ESTRAD-FE 1-20 MG-MCG PO TABS: 1.0000 | ORAL_TABLET | Freq: Every day | ORAL | 11 refills | Status: DC

## 2016-02-05 NOTE — Progress Notes (Signed)
Patient ID: Anna Vasquez, female   DOB: 01/15/1986, 30 y.o.   MRN: [id number]   Subjective:    Patient ID: Anna Vasquez, female    DOB: 01/06/1986, 30 y.o.   MRN: [id number]  HPI  Patient here for her physical exam.  States she is doing well.  She is tolerating her ocp's.  No significant headaches.  She is having spotting between her periods.  No abdominal pain.  Still with cramping with her menstrual period.  No nausea or vomiting.  Bowels stable.  Breathing stable.  Persistent lesions on her hand.  Hands are dry, but has persistent minimally raised lesions over left hand.  No significant itching.     Past Medical History:  Diagnosis Date  . Frequent headaches   . GERD (gastroesophageal reflux disease)   . H/O febrile seizure   . Hx of migraines   . Hypothyroidism   . Hypothyroidism   . PCOD (polycystic ovarian disease)   . Pseudotumor cerebri   . Thoracic outlet syndrome    extra cervical ribs  . Vitamin D deficiency    Past Surgical History:  Procedure Laterality Date  . LAPAROSCOPIC GASTRIC SLEEVE RESECTION    . MOUTH SURGERY  1998   Family History  Problem Relation Age of Onset  . Arthritis Father   . Hypertension Father   . Sleep apnea Father   . Transient ischemic attack Father   . Breast cancer Paternal Aunt   . Mental illness Paternal Uncle   . Prostate cancer Paternal Grandfather    Social History   Social History  . Marital status: Single    Spouse name: N/A  . Number of children: N/A  . Years of education: N/A   Social History Main Topics  . Smoking status: Never Smoker  . Smokeless tobacco: Never Used  . Alcohol use 0.0 oz/week  . Drug use: No  . Sexual activity: Not Asked   Other Topics Concern  . None   Social History Narrative  . None    Outpatient Encounter Prescriptions as of 02/05/2016  Medication Sig  . acetaminophen (TYLENOL) 325 MG tablet Take 650 mg by mouth every 6 (six) hours as needed for pain.  Marland Kitchen. SYNTHROID 112 MCG tablet  TAKE 1 TABLET BY MOUTH DAILY  . [DISCONTINUED] LO LOESTRIN FE 1 MG-10 MCG / 10 MCG tablet TAKE 1 TABLET BY MOUTH EVERY DAY  . norethindrone-ethinyl estradiol (LOESTRIN FE 1/20) 1-20 MG-MCG tablet Take 1 tablet by mouth daily.  Marland Kitchen. triamcinolone cream (KENALOG) 0.1 % Apply 1 application topically 2 (two) times daily.  . [DISCONTINUED] amoxicillin (AMOXIL) 500 MG tablet Take 1 tablet (500 mg total) by mouth 2 (two) times daily.   No facility-administered encounter medications on file as of 02/05/2016.     Review of Systems  Constitutional: Negative for appetite change and unexpected weight change.  HENT: Negative for congestion and sinus pressure.   Respiratory: Negative for cough, chest tightness and shortness of breath.   Cardiovascular: Negative for chest pain, palpitations and leg swelling.  Gastrointestinal: Negative for abdominal pain, diarrhea, nausea and vomiting.  Genitourinary: Negative for difficulty urinating and dysuria.       Periods as outlined.   Musculoskeletal: Negative for back pain and joint swelling.  Skin: Negative for color change and rash.  Neurological: Negative for dizziness and light-headedness.       No significant problems with headaches.    Psychiatric/Behavioral: Negative for agitation and dysphoric mood.  Objective:    Physical Exam  Constitutional: She appears well-developed and well-nourished. No distress.  HENT:  Nose: Nose normal.  Mouth/Throat: Oropharynx is clear and moist.  Neck: Neck supple. No thyromegaly present.  Cardiovascular: Normal rate and regular rhythm.   Pulmonary/Chest: Breath sounds normal. No respiratory distress. She has no wheezes.  Abdominal: Soft. Bowel sounds are normal. There is no tenderness.  Musculoskeletal: She exhibits no edema or tenderness.  Lymphadenopathy:    She has no cervical adenopathy.  Skin: No erythema.  Minimal raised lesions over left hand.  No increased erythema or warmth.    Psychiatric: She has  a normal mood and affect. Her behavior is normal.    BP 128/84   Pulse 62   Temp 97.9 F (36.6 C) (Oral)   Ht 5\' 6"  (1.676 m)   Wt 221 lb (100.2 kg)   LMP 01/29/2016 (Exact Date)   SpO2 99%   BMI 35.67 kg/m  Wt Readings from Last 3 Encounters:  02/05/16 221 lb (100.2 kg)  06/27/15 200 lb (90.7 kg)  06/05/15 200 lb 12 oz (91.1 kg)     Lab Results  Component Value Date   WBC 11.4 (H) 05/07/2015   HGB 14.4 05/07/2015   HCT 43.4 05/07/2015   PLT 219 05/07/2015   GLUCOSE 113 (H) 05/07/2015   CHOL 184 03/07/2015   TRIG 71.0 03/07/2015   HDL 63.60 03/07/2015   LDLCALC 106 (H) 03/07/2015   ALT 13 (L) 05/07/2015   AST 22 05/07/2015   NA 134 (L) 05/07/2015   K 3.7 05/07/2015   CL 102 05/07/2015   CREATININE 0.77 05/07/2015   BUN 14 05/07/2015   CO2 21 (L) 05/07/2015   TSH 1.56 03/07/2015   INR 1.0 08/25/2012   HGBA1C 5.2 08/25/2012       Assessment & Plan:   Problem List Items Addressed This Visit    Carpal tunnel syndrome    S/p surgery.  Doing well.  Planning for the other side.  Follow.       Hypothyroidism    On thyroid replacement.  Follow tsh.       Migraine    Has seen neurology.  No headaches now.  Doing well.  On ocp's.  Continue low dose estrogen.  Follow.        Obesity (BMI 30-39.9)    Diet and exercise.  Follow.       PCOD (polycystic ovarian disease)    Stable.  On ocp's.  Start lo estrin as directed.  Periods as outlined.        Pseudotumor cerebri    No headaches now.  Follow.        Skin lesion of hand    Apply TCC topically.  Follow.  Notify me if persist.  May need dermatology evaluation.            Dale DurhamSCOTT, Areatha Kalata, MD

## 2016-02-05 NOTE — Progress Notes (Signed)
Pre visit review using our clinic review tool, if applicable. No additional management support is needed unless otherwise documented below in the visit note. 

## 2016-02-08 ENCOUNTER — Encounter: Payer: Self-pay | Admitting: Internal Medicine

## 2016-02-08 DIAGNOSIS — L989 Disorder of the skin and subcutaneous tissue, unspecified: Secondary | ICD-10-CM | POA: Insufficient documentation

## 2016-02-08 NOTE — Assessment & Plan Note (Signed)
S/p surgery.  Doing well.  Planning for the other side.  Follow.

## 2016-02-08 NOTE — Assessment & Plan Note (Signed)
Diet and exercise.  Follow.  

## 2016-02-08 NOTE — Assessment & Plan Note (Signed)
On thyroid replacement.  Follow tsh.  

## 2016-02-08 NOTE — Assessment & Plan Note (Signed)
Has seen neurology.  No headaches now.  Doing well.  On ocp's.  Continue low dose estrogen.  Follow.

## 2016-02-08 NOTE — Assessment & Plan Note (Signed)
Stable.  On ocp's.  Start lo estrin as directed.  Periods as outlined.

## 2016-02-08 NOTE — Assessment & Plan Note (Signed)
Apply TCC topically.  Follow.  Notify me if persist.  May need dermatology evaluation.

## 2016-02-08 NOTE — Assessment & Plan Note (Signed)
No headaches now.  Follow.

## 2016-02-26 DIAGNOSIS — Z01419 Encounter for gynecological examination (general) (routine) without abnormal findings: Secondary | ICD-10-CM | POA: Diagnosis not present

## 2016-02-26 DIAGNOSIS — Z6836 Body mass index (BMI) 36.0-36.9, adult: Secondary | ICD-10-CM | POA: Diagnosis not present

## 2016-02-27 DIAGNOSIS — G5602 Carpal tunnel syndrome, left upper limb: Secondary | ICD-10-CM | POA: Diagnosis not present

## 2016-02-27 DIAGNOSIS — Z79899 Other long term (current) drug therapy: Secondary | ICD-10-CM | POA: Diagnosis not present

## 2016-02-27 DIAGNOSIS — G5601 Carpal tunnel syndrome, right upper limb: Secondary | ICD-10-CM | POA: Diagnosis not present

## 2016-02-27 DIAGNOSIS — E039 Hypothyroidism, unspecified: Secondary | ICD-10-CM | POA: Diagnosis not present

## 2016-02-27 DIAGNOSIS — G709 Myoneural disorder, unspecified: Secondary | ICD-10-CM | POA: Diagnosis not present

## 2016-03-26 ENCOUNTER — Other Ambulatory Visit: Payer: Self-pay | Admitting: Internal Medicine

## 2016-09-02 ENCOUNTER — Other Ambulatory Visit: Payer: Self-pay

## 2016-09-02 MED ORDER — LEVOTHYROXINE SODIUM 112 MCG PO TABS: 112.0000 ug | ORAL_TABLET | Freq: Every day | ORAL | 2 refills | Status: DC

## 2016-09-06 ENCOUNTER — Other Ambulatory Visit: Payer: Self-pay

## 2016-10-04 DIAGNOSIS — Z713 Dietary counseling and surveillance: Secondary | ICD-10-CM | POA: Diagnosis not present

## 2016-12-28 ENCOUNTER — Other Ambulatory Visit: Payer: Self-pay | Admitting: Internal Medicine

## 2017-01-04 ENCOUNTER — Other Ambulatory Visit: Payer: Self-pay | Admitting: Internal Medicine

## 2017-01-04 NOTE — Telephone Encounter (Signed)
Last given 02/05/16 with 11 rf Last CPE 02/05/16 Next Cpe 02/07/17

## 2017-01-04 NOTE — Telephone Encounter (Signed)
Confirm with pt she has been taking regularly and not missed.  If so, then ok to refill x 3.  This will allow her enough to get to her f/u with me.

## 2017-01-05 NOTE — Telephone Encounter (Signed)
Left message to return call to our office.  

## 2017-01-25 ENCOUNTER — Other Ambulatory Visit: Payer: Self-pay

## 2017-01-25 MED ORDER — LEVOTHYROXINE SODIUM 112 MCG PO TABS: 112.0000 ug | ORAL_TABLET | Freq: Every day | ORAL | 0 refills | Status: DC

## 2017-02-07 ENCOUNTER — Ambulatory Visit (INDEPENDENT_AMBULATORY_CARE_PROVIDER_SITE_OTHER): Payer: BLUE CROSS/BLUE SHIELD | Admitting: Internal Medicine

## 2017-02-07 ENCOUNTER — Encounter: Payer: Self-pay | Admitting: Internal Medicine

## 2017-02-07 ENCOUNTER — Other Ambulatory Visit: Payer: Self-pay

## 2017-02-07 VITALS — BP 126/82 | HR 68 | Temp 97.9°F | Resp 14 | Ht 66.0 in | Wt 214.2 lb

## 2017-02-07 DIAGNOSIS — E039 Hypothyroidism, unspecified: Secondary | ICD-10-CM

## 2017-02-07 DIAGNOSIS — R61 Generalized hyperhidrosis: Secondary | ICD-10-CM | POA: Diagnosis not present

## 2017-02-07 DIAGNOSIS — E559 Vitamin D deficiency, unspecified: Secondary | ICD-10-CM

## 2017-02-07 DIAGNOSIS — Z23 Encounter for immunization: Secondary | ICD-10-CM | POA: Diagnosis not present

## 2017-02-07 DIAGNOSIS — E282 Polycystic ovarian syndrome: Secondary | ICD-10-CM | POA: Diagnosis not present

## 2017-02-07 DIAGNOSIS — Z6834 Body mass index (BMI) 34.0-34.9, adult: Secondary | ICD-10-CM

## 2017-02-07 DIAGNOSIS — G932 Benign intracranial hypertension: Secondary | ICD-10-CM

## 2017-02-07 DIAGNOSIS — Z0000 Encounter for general adult medical examination without abnormal findings: Secondary | ICD-10-CM | POA: Diagnosis not present

## 2017-02-07 DIAGNOSIS — G43809 Other migraine, not intractable, without status migrainosus: Secondary | ICD-10-CM

## 2017-02-07 LAB — CBC WITH DIFFERENTIAL/PLATELET
BASOS ABS: 0 10*3/uL (ref 0.0–0.1)
BASOS PCT: 0.5 % (ref 0.0–3.0)
EOS ABS: 0 10*3/uL (ref 0.0–0.7)
Eosinophils Relative: 0.8 % (ref 0.0–5.0)
HCT: 39.8 % (ref 36.0–46.0)
Hemoglobin: 13 g/dL (ref 12.0–15.0)
LYMPHS ABS: 1.9 10*3/uL (ref 0.7–4.0)
Lymphocytes Relative: 31.1 % (ref 12.0–46.0)
MCHC: 32.6 g/dL (ref 30.0–36.0)
MCV: 94.7 fl (ref 78.0–100.0)
Monocytes Absolute: 0.4 10*3/uL (ref 0.1–1.0)
Monocytes Relative: 6.3 % (ref 3.0–12.0)
NEUTROS ABS: 3.8 10*3/uL (ref 1.4–7.7)
NEUTROS PCT: 61.3 % (ref 43.0–77.0)
PLATELETS: 235 10*3/uL (ref 150.0–400.0)
RBC: 4.21 Mil/uL (ref 3.87–5.11)
RDW: 13 % (ref 11.5–15.5)
WBC: 6.2 10*3/uL (ref 4.0–10.5)

## 2017-02-07 LAB — TSH: TSH: 0.69 u[IU]/mL (ref 0.35–4.50)

## 2017-02-07 LAB — COMPREHENSIVE METABOLIC PANEL
ALT: 18 U/L (ref 0–35)
AST: 21 U/L (ref 0–37)
Albumin: 4.1 g/dL (ref 3.5–5.2)
Alkaline Phosphatase: 44 U/L (ref 39–117)
BILIRUBIN TOTAL: 1.1 mg/dL (ref 0.2–1.2)
BUN: 12 mg/dL (ref 6–23)
CHLORIDE: 105 meq/L (ref 96–112)
CO2: 28 meq/L (ref 19–32)
Calcium: 9.2 mg/dL (ref 8.4–10.5)
Creatinine, Ser: 0.77 mg/dL (ref 0.40–1.20)
GFR: 92.26 mL/min (ref >60.00)
GLUCOSE: 88 mg/dL (ref 70–99)
Potassium: 4.4 mEq/L (ref 3.5–5.1)
Sodium: 138 mEq/L (ref 135–145)
Total Protein: 6.9 g/dL (ref 6.0–8.3)

## 2017-02-07 MED ORDER — LEVOTHYROXINE SODIUM 112 MCG PO TABS: 112.0000 ug | ORAL_TABLET | Freq: Every day | ORAL | 3 refills | Status: DC

## 2017-02-07 MED ORDER — NORETHIN ACE-ETH ESTRAD-FE 1-20 MG-MCG PO TABS: 1.0000 | ORAL_TABLET | Freq: Every day | ORAL | 12 refills | Status: DC

## 2017-02-07 NOTE — Progress Notes (Signed)
Patient ID: Anna Vasquez, female   DOB: 12/30/1985, 31 y.o.   MRN: [id number]   Subjective:    Patient ID: Anna Vasquez, female    DOB: 08/30/1985, 31 y.o.   MRN: [id number]  HPI  Patient here for her physical exam.  She reports she is doing well.  Trying to stay active.  No significant allergy symptoms.  No chest congestion.  Breathing well.  No acid reflux.  No abdominal pain.  Bowels moving.  No urine change.  Taking loestrin.  Taking her pills regularly. States periods are regular, but occurs the week before her placebo pills.  No break through bleeding otherwise.  Has a f/u appt with gyn.  Plans to discuss.  Also, states planning in the next year to start trying to get pregnant.  Discussed pre natal vitamins.     Past Medical History:  Diagnosis Date  . Frequent headaches   . GERD (gastroesophageal reflux disease)   . H/O febrile seizure   . Hx of migraines   . Hypothyroidism   . Hypothyroidism   . PCOD (polycystic ovarian disease)   . Pseudotumor cerebri   . Thoracic outlet syndrome    extra cervical ribs  . Vitamin D deficiency    Past Surgical History:  Procedure Laterality Date  . LAPAROSCOPIC GASTRIC SLEEVE RESECTION    . MOUTH SURGERY  1998   Family History  Problem Relation Age of Onset  . Arthritis Father   . Hypertension Father   . Sleep apnea Father   . Transient ischemic attack Father   . Breast cancer Paternal Aunt   . Mental illness Paternal Uncle   . Prostate cancer Paternal Grandfather    Social History   Socioeconomic History  . Marital status: Single    Spouse name: None  . Number of children: None  . Years of education: None  . Highest education level: None  Social Needs  . Financial resource strain: None  . Food insecurity - worry: None  . Food insecurity - inability: None  . Transportation needs - medical: None  . Transportation needs - non-medical: None  Occupational History  . None  Tobacco Use  . Smoking status: Never Smoker  .  Smokeless tobacco: Never Used  Substance and Sexual Activity  . Alcohol use: Yes    Alcohol/week: 0.0 oz  . Drug use: No  . Sexual activity: None  Other Topics Concern  . None  Social History Narrative  . None    Outpatient Encounter Medications as of 02/07/2017  Medication Sig  . acetaminophen (TYLENOL) 325 MG tablet Take 650 mg by mouth every 6 (six) hours as needed for pain.  Marland Kitchen. norethindrone-ethinyl estradiol (JUNEL FE 1/20) 1-20 MG-MCG tablet Take 1 tablet daily by mouth.  . [DISCONTINUED] JUNEL FE 1/20 1-20 MG-MCG tablet TAKE 1 TABLET BY MOUTH DAILY.  . [DISCONTINUED] levothyroxine (SYNTHROID) 112 MCG tablet Take 1 tablet (112 mcg total) by mouth daily.  . [DISCONTINUED] levothyroxine (SYNTHROID) 112 MCG tablet Take 1 tablet (112 mcg total) daily by mouth.  . [DISCONTINUED] levothyroxine (SYNTHROID) 112 MCG tablet Take 1 tablet (112 mcg total) daily by mouth.  . [DISCONTINUED] triamcinolone cream (KENALOG) 0.1 % Apply 1 application topically 2 (two) times daily.   No facility-administered encounter medications on file as of 02/07/2017.     Review of Systems  Constitutional: Negative for appetite change and unexpected weight change.  HENT: Negative for congestion and sinus pressure.   Eyes: Negative for pain and  visual disturbance.  Respiratory: Negative for cough, chest tightness and shortness of breath.   Cardiovascular: Negative for chest pain, palpitations and leg swelling.  Gastrointestinal: Negative for abdominal pain, diarrhea, nausea and vomiting.  Genitourinary: Negative for difficulty urinating and dysuria.  Musculoskeletal: Negative for back pain and joint swelling.  Skin: Negative for color change and rash.  Neurological: Negative for dizziness, light-headedness and headaches.  Hematological: Negative for adenopathy. Does not bruise/bleed easily.  Psychiatric/Behavioral: Negative for agitation and dysphoric mood.       Objective:    Physical Exam    Constitutional: She is oriented to person, place, and time. She appears well-developed and well-nourished. No distress.  HENT:  Nose: Nose normal.  Mouth/Throat: Oropharynx is clear and moist.  Eyes: Right eye exhibits no discharge. Left eye exhibits no discharge. No scleral icterus.  Neck: Neck supple. No thyromegaly present.  Cardiovascular: Normal rate and regular rhythm.  Pulmonary/Chest: Breath sounds normal. No accessory muscle usage. No tachypnea. No respiratory distress. She has no decreased breath sounds. She has no wheezes. She has no rhonchi. Right breast exhibits no inverted nipple, no mass, no nipple discharge and no tenderness (no axillary adenopathy). Left breast exhibits no inverted nipple, no mass, no nipple discharge and no tenderness (no axilarry adenopathy).  Abdominal: Soft. Bowel sounds are normal. There is no tenderness.  Genitourinary:  Genitourinary Comments: Performed by gyn.   Musculoskeletal: She exhibits no edema or tenderness.  Lymphadenopathy:    She has no cervical adenopathy.  Neurological: She is alert and oriented to person, place, and time.  Skin: Skin is warm. No rash noted. No erythema.  Psychiatric: She has a normal mood and affect. Her behavior is normal.    BP 126/82 (BP Location: Left Arm, Patient Position: Sitting, Cuff Size: Normal)   Pulse 68   Temp 97.9 F (36.6 C) (Oral)   Resp 14   Ht 5\' 6"  (1.676 m)   Wt 214 lb 3.2 oz (97.2 kg)   SpO2 98%   BMI 34.57 kg/m  Wt Readings from Last 3 Encounters:  02/07/17 214 lb 3.2 oz (97.2 kg)  02/05/16 221 lb (100.2 kg)  06/27/15 200 lb (90.7 kg)     Lab Results  Component Value Date   WBC 6.2 02/07/2017   HGB 13.0 02/07/2017   HCT 39.8 02/07/2017   PLT 235.0 02/07/2017   GLUCOSE 88 02/07/2017   CHOL 184 03/07/2015   TRIG 71.0 03/07/2015   HDL 63.60 03/07/2015   LDLCALC 106 (H) 03/07/2015   ALT 18 02/07/2017   AST 21 02/07/2017   NA 138 02/07/2017   K 4.4 02/07/2017   CL 105  02/07/2017   CREATININE 0.77 02/07/2017   BUN 12 02/07/2017   CO2 28 02/07/2017   TSH 0.69 02/07/2017   INR 1.0 08/25/2012   HGBA1C 5.2 08/25/2012       Assessment & Plan:   Problem List Items Addressed This Visit    BMI 34.0-34.9,adult    Discussed diet and exercise.  Follow.       Health care maintenance    Physical today 02/07/17.  Sees gyn for pap smear.        Hypothyroidism   Relevant Orders   TSH (Completed)   Migraine    Has seen neurology.  Headaches improved.  Follow.        Night sweats   Relevant Orders   CBC with Differential/Platelet (Completed)   Comprehensive metabolic panel (Completed)   PCOD (polycystic ovarian disease)  Stable.  On ocp's.  Plans to f/u with gyn next month.        Pseudotumor cerebri    No headaches now.  Follow.       Vitamin D deficiency    Follow vitamin D level.        Other Visit Diagnoses    Routine general medical examination at a health care facility    -  Primary   Need for immunization against influenza       Relevant Orders   Flu Vaccine QUAD 36+ mos IM (Completed)       Dale DurhamSCOTT, Klea Nall, MD

## 2017-02-08 ENCOUNTER — Encounter: Payer: Self-pay | Admitting: Internal Medicine

## 2017-02-08 MED ORDER — SYNTHROID 112 MCG PO TABS: 112.0000 ug | ORAL_TABLET | Freq: Every day | ORAL | 3 refills | Status: DC

## 2017-02-08 NOTE — Telephone Encounter (Signed)
Please cancel rx for levothyroxine and send in name brand synthroid.  See her my chart message.  Thanks

## 2017-02-10 ENCOUNTER — Encounter: Payer: Self-pay | Admitting: Internal Medicine

## 2017-02-10 NOTE — Assessment & Plan Note (Signed)
Has seen neurology.  Headaches improved.  Follow.

## 2017-02-10 NOTE — Assessment & Plan Note (Signed)
Stable.  On ocp's.  Plans to f/u with gyn next month.

## 2017-02-10 NOTE — Assessment & Plan Note (Signed)
No headaches now.  Follow.   

## 2017-02-10 NOTE — Assessment & Plan Note (Signed)
Physical today 02/07/17.  Sees gyn for pap smear.

## 2017-02-10 NOTE — Assessment & Plan Note (Signed)
Discussed diet and exercise.  Follow.  

## 2017-02-10 NOTE — Assessment & Plan Note (Signed)
Follow vitamin D level.  

## 2017-03-22 NOTE — L&D Delivery Note (Signed)
Operative Delivery Note At 3:13 PM a viable female was delivered via Vaginal, [employer], operational).  Presentation: vertex; Position: Right,, Occiput,, Anterior; Station: +2.  Verbal consent: obtained from patient.  Risks and benefits discussed in detail.  Risks include, but are not limited to the risks of anesthesia, bleeding, infection, damage to maternal tissues, fetal cephalhematoma.  There is also the risk of inability to effect vaginal delivery of the head, or shoulder dystocia that cannot be resolved by established maneuvers, leading to the need for emergency cesarean section.  APGAR: 9, 9; weight  .pending Placenta status: spontaneously with 3 vessel cord, .   Cord:  with the following complications:none .  Cord pH: not obtained  Anesthesia:  local Instruments: mushroom Episiotomy: None Lacerations: 2nd degree Suture Repair: 3.0 chromic Est. Blood Loss (mL):    Mom to postpartum.  Baby to Couplet care / Skin to Skin.  Fortunato Nordin L 01/26/2018, 3:37 PM

## 2017-03-25 DIAGNOSIS — Z01419 Encounter for gynecological examination (general) (routine) without abnormal findings: Secondary | ICD-10-CM | POA: Diagnosis not present

## 2017-03-25 DIAGNOSIS — Z6836 Body mass index (BMI) 36.0-36.9, adult: Secondary | ICD-10-CM | POA: Diagnosis not present

## 2017-06-17 DIAGNOSIS — N911 Secondary amenorrhea: Secondary | ICD-10-CM | POA: Diagnosis not present

## 2017-06-21 DIAGNOSIS — Z3401 Encounter for supervision of normal first pregnancy, first trimester: Secondary | ICD-10-CM | POA: Diagnosis not present

## 2017-06-21 LAB — OB RESULTS CONSOLE HIV ANTIBODY (ROUTINE TESTING): HIV: NONREACTIVE

## 2017-06-21 LAB — OB RESULTS CONSOLE RUBELLA ANTIBODY, IGM: RUBELLA: IMMUNE

## 2017-06-21 LAB — OB RESULTS CONSOLE ABO/RH: RH Type: NEGATIVE

## 2017-06-21 LAB — OB RESULTS CONSOLE GC/CHLAMYDIA
CHLAMYDIA, DNA PROBE: NEGATIVE
GC PROBE AMP, GENITAL: NEGATIVE

## 2017-06-21 LAB — OB RESULTS CONSOLE HEPATITIS B SURFACE ANTIGEN: HEP B S AG: NEGATIVE

## 2017-06-21 LAB — OB RESULTS CONSOLE ANTIBODY SCREEN: Antibody Screen: NEGATIVE

## 2017-06-21 LAB — OB RESULTS CONSOLE RPR: RPR: NONREACTIVE

## 2017-06-30 DIAGNOSIS — Z3400 Encounter for supervision of normal first pregnancy, unspecified trimester: Secondary | ICD-10-CM | POA: Diagnosis not present

## 2017-06-30 DIAGNOSIS — Z113 Encounter for screening for infections with a predominantly sexual mode of transmission: Secondary | ICD-10-CM | POA: Diagnosis not present

## 2017-06-30 DIAGNOSIS — Z3A10 10 weeks gestation of pregnancy: Secondary | ICD-10-CM | POA: Diagnosis not present

## 2017-07-07 DIAGNOSIS — Z3A12 12 weeks gestation of pregnancy: Secondary | ICD-10-CM | POA: Diagnosis not present

## 2017-07-07 DIAGNOSIS — Z3682 Encounter for antenatal screening for nuchal translucency: Secondary | ICD-10-CM | POA: Diagnosis not present

## 2017-07-28 ENCOUNTER — Encounter: Payer: Self-pay | Admitting: Family Medicine

## 2017-07-28 ENCOUNTER — Ambulatory Visit (INDEPENDENT_AMBULATORY_CARE_PROVIDER_SITE_OTHER): Payer: BLUE CROSS/BLUE SHIELD | Admitting: Family Medicine

## 2017-07-28 VITALS — BP 120/76 | HR 76 | Temp 98.3°F | Resp 16 | Wt 220.2 lb

## 2017-07-28 DIAGNOSIS — B029 Zoster without complications: Secondary | ICD-10-CM | POA: Diagnosis not present

## 2017-07-28 NOTE — Progress Notes (Signed)
Subjective:    Patient ID: Anna Vasquez, female    DOB: [birth year], 32 y.o.   MRN: [id number]  HPI  Anna Vasquez is a 32 year old female who presents today for recent herpes zoster on right side/back  that was diagnosed one week ago at her OBGYN office. She experienced symptoms of itching and sensitive rash that was noticed. She was evaluated 4 days ago and provided Valtrex. She was asked to follow up in 3 days at this office to assess for improvement.  She reports taking Valtrex 1 gram BID x 3 days and symptoms have improved. She reports sleeping better and she notices that rash remains "sensitive."  Today, she reports feeling better and she is staying home from work for the remainder of the week. Rash is improving, however mild pruritis remains. She denies pain and reports sleeping has improved at night. She denies any new lesions but states that there are some blisters still present.   She is [redacted] weeks pregnant. Denies fever, chills, HA, or N/V today.   Review of Systems  Constitutional: Negative for chills, fatigue and fever.  Respiratory: Negative for cough, shortness of breath and wheezing.   Cardiovascular: Negative for chest pain and palpitations.  Gastrointestinal: Negative for diarrhea, nausea and vomiting.  Musculoskeletal: Negative for back pain.  Skin: Positive for rash.  Neurological: Negative for dizziness, weakness, light-headedness and headaches.   Past Medical History:  Diagnosis Date  . Frequent headaches   . GERD (gastroesophageal reflux disease)   . H/O febrile seizure   . Hx of migraines   . Hypothyroidism   . Hypothyroidism   . PCOD (polycystic ovarian disease)   . Pseudotumor cerebri   . Thoracic outlet syndrome    extra cervical ribs  . Vitamin D deficiency      Social History   Socioeconomic History  . Marital status: Single    Spouse name: Not on file  . Number of children: Not on file  . Years of education: Not on file  . Highest education  level: Not on file  Occupational History  . Not on file  Social Needs  . Financial resource strain: Not on file  . Food insecurity:    Worry: Not on file    Inability: Not on file  . Transportation needs:    Medical: Not on file    Non-medical: Not on file  Tobacco Use  . Smoking status: Never Smoker  . Smokeless tobacco: Never Used  Substance and Sexual Activity  . Alcohol use: Yes    Alcohol/week: 0.0 oz  . Drug use: No  . Sexual activity: Not on file  Lifestyle  . Physical activity:    Days per week: Not on file    Minutes per session: Not on file  . Stress: Not on file  Relationships  . Social connections:    Talks on phone: Not on file    Gets together: Not on file    Attends religious service: Not on file    Active member of club or organization: Not on file    Attends meetings of clubs or organizations: Not on file    Relationship status: Not on file  . Intimate partner violence:    Fear of current or ex partner: Not on file    Emotionally abused: Not on file    Physically abused: Not on file    Forced sexual activity: Not on file  Other Topics Concern  . Not on file  Social History Narrative  . Not on file    Past Surgical History:  Procedure Laterality Date  . LAPAROSCOPIC GASTRIC SLEEVE RESECTION    . MOUTH SURGERY  1998    Family History  Problem Relation Age of Onset  . Arthritis Father   . Hypertension Father   . Sleep apnea Father   . Transient ischemic attack Father   . Breast cancer Paternal Aunt   . Mental illness Paternal Uncle   . Prostate cancer Paternal Grandfather     Allergies  Allergen Reactions  . Tetracyclines & Related     Current Outpatient Medications on File Prior to Visit  Medication Sig Dispense Refill  . acetaminophen (TYLENOL) 325 MG tablet Take 650 mg by mouth every 6 (six) hours as needed for pain.    Marland Kitchen SYNTHROID 125 MCG tablet   1  . valACYclovir (VALTREX) 1000 MG tablet TAKE 1 TABLET BY MOUTH TWICE A DAY FOR 3  DAYS  0   No current facility-administered medications on file prior to visit.     BP 120/76 (BP Location: Left Arm, Patient Position: Sitting, Cuff Size: Normal)   Pulse 76   Temp 98.3 F (36.8 C) (Oral)   Resp 16   Wt 220 lb 4 oz (99.9 kg)   SpO2 98%   BMI 35.55 kg/m      Objective:   Physical Exam  Constitutional: She is oriented to person, place, and time. She appears well-developed and well-nourished.  HENT:  Mouth/Throat: No oropharyngeal exudate.  Eyes: Pupils are equal, round, and reactive to light.  Neck: Neck supple.  Cardiovascular: Normal rate and regular rhythm.  Pulmonary/Chest: Effort normal and breath sounds normal.  Lymphadenopathy:    She has no cervical adenopathy.  Neurological: She is alert and oriented to person, place, and time.  Skin: Skin is warm and dry. Rash noted.  Erythematous rash with crusting of right thoracic area that follows a dermatomal pattern in the approximate T7-T8 area. Two vesicular lesions with erythematous base are present. Appears to be healing, no evidence of infection are present.  Psychiatric: She has a normal mood and affect. Her behavior is normal. Judgment and thought content normal.       Assessment & Plan:  1. Herpes zoster without complication Improving; two vesicular lesions present; will continue Valtrex for 4 more days for total treatment time of 7 days. We reviewed symptoms of herpes zoster and advised her to follow up for further evaluation if symptoms do not continue to improve, worsen, or new symptoms or lesions develop. Further advised her to contact her OB provider to report treatment today and also determine next follow up at [facility] office. Today, I recommended that she should not return to [facility] office for evaluation due to 2 active lesions noted. We reviewed that she remains able to spread zoster with active lesions that are not crusted over.  Return precautions advised. She will reschedule with OB and report findings  and treatment with her provider today.  Roddie Mc, FNP-C

## 2017-07-28 NOTE — Patient Instructions (Addendum)
Please continue Valtrex for 4 more days. If symptoms are not improving, worsen, or new symptoms develop please follow up for further evaluation.  Please check with your OB provider regarding treatment today and to determine next follow up.   Shingles Shingles, which is also known as herpes zoster, is an infection that causes a painful skin rash and fluid-filled blisters. Shingles is not related to genital herpes, which is a sexually transmitted infection. Shingles only develops in people who:  Have had chickenpox.  Have received the chickenpox vaccine. (This is rare.)  What are the causes? Shingles is caused by varicella-zoster virus (VZV). This is the same virus that causes chickenpox. After exposure to VZV, the virus stays in the body in an inactive (dormant) state. Shingles develops if the virus reactivates. This can happen many years after the initial exposure to VZV. It is not known what causes this virus to reactivate. What increases the risk? People who have had chickenpox or received the chickenpox vaccine are at risk for shingles. Infection is more common in people who:  Are older than age 55.  Have a weakened defense (immune) system, such as those with HIV, AIDS, or cancer.  Are taking medicines that weaken the immune system, such as transplant medicines.  Are under great stress.  What are the signs or symptoms? Early symptoms of this condition include itching, tingling, and pain in an area on your skin. Pain may be described as burning, stabbing, or throbbing. A few days or weeks after symptoms start, a painful red rash appears, usually on one side of the body in a bandlike or beltlike pattern. The rash eventually turns into fluid-filled blisters that break open, scab over, and dry up in about 2-3 weeks. At any time during the infection, you may also develop:  A fever.  Chills.  A headache.  An upset stomach.  How is this diagnosed? This condition is diagnosed with  a skin exam. Sometimes, skin or fluid samples are taken from the blisters before a diagnosis is made. These samples are examined under a microscope or sent to a lab for testing. How is this treated? There is no specific cure for this condition. Your health care provider will probably prescribe medicines to help you manage pain, recover more quickly, and avoid long-term problems. Medicines may include:  Antiviral drugs.  Anti-inflammatory drugs.  Pain medicines.  If the area involved is on your face, you may be referred to a specialist, such as an eye doctor (ophthalmologist) or an ear, nose, and throat (ENT) doctor to help you avoid eye problems, chronic pain, or disability. Follow these instructions at home: Medicines  Take medicines only as directed by your health care provider.  Apply an anti-itch or numbing cream to the affected area as directed by your health care provider. Blister and Rash Care  Take a cool bath or apply cool compresses to the area of the rash or blisters as directed by your health care provider. This may help with pain and itching.  Keep your rash covered with a loose bandage (dressing). Wear loose-fitting clothing to help ease the pain of material rubbing against the rash.  Keep your rash and blisters clean with mild soap and cool water or as directed by your health care provider.  Check your rash every day for signs of infection. These include redness, swelling, and pain that lasts or increases.  Do not pick your blisters.  Do not scratch your rash. General instructions  Rest as directed  by your health care provider.  Keep all follow-up visits as directed by your health care provider. This is important.  Until your blisters scab over, your infection can cause chickenpox in people who have never had it or been vaccinated against it. To prevent this from happening, avoid contact with other people, especially: ? Babies. ? Pregnant women. ? Children who  have eczema. ? Elderly people who have transplants. ? People who have chronic illnesses, such as leukemia or AIDS. Contact a health care provider if:  Your pain is not relieved with prescribed medicines.  Your pain does not get better after the rash heals.  Your rash looks infected. Signs of infection include redness, swelling, and pain that lasts or increases. Get help right away if:  The rash is on your face or nose.  You have facial pain, pain around your eye area, or loss of feeling on one side of your face.  You have ear pain or you have ringing in your ear.  You have loss of taste.  Your condition gets worse. This information is not intended to replace advice given to you by your health care provider. Make sure you discuss any questions you have with your health care provider. Document Released: 03/08/2005 Document Revised: 11/02/2015 Document Reviewed: 01/17/2014 Elsevier Interactive Patient Education  2018 [employer].

## 2017-08-25 DIAGNOSIS — Z3A19 19 weeks gestation of pregnancy: Secondary | ICD-10-CM | POA: Diagnosis not present

## 2017-08-25 DIAGNOSIS — Z363 Encounter for antenatal screening for malformations: Secondary | ICD-10-CM | POA: Diagnosis not present

## 2017-09-21 DIAGNOSIS — Z362 Encounter for other antenatal screening follow-up: Secondary | ICD-10-CM | POA: Diagnosis not present

## 2017-09-21 DIAGNOSIS — Z3A22 22 weeks gestation of pregnancy: Secondary | ICD-10-CM | POA: Diagnosis not present

## 2017-09-21 DIAGNOSIS — E039 Hypothyroidism, unspecified: Secondary | ICD-10-CM | POA: Diagnosis not present

## 2017-10-17 DIAGNOSIS — Z23 Encounter for immunization: Secondary | ICD-10-CM | POA: Diagnosis not present

## 2017-10-17 DIAGNOSIS — Z3480 Encounter for supervision of other normal pregnancy, unspecified trimester: Secondary | ICD-10-CM | POA: Diagnosis not present

## 2017-11-30 DIAGNOSIS — O99283 Endocrine, nutritional and metabolic diseases complicating pregnancy, third trimester: Secondary | ICD-10-CM | POA: Diagnosis not present

## 2017-11-30 DIAGNOSIS — E039 Hypothyroidism, unspecified: Secondary | ICD-10-CM | POA: Diagnosis not present

## 2017-12-15 DIAGNOSIS — Z3480 Encounter for supervision of other normal pregnancy, unspecified trimester: Secondary | ICD-10-CM | POA: Diagnosis not present

## 2017-12-15 DIAGNOSIS — Z3685 Encounter for antenatal screening for Streptococcus B: Secondary | ICD-10-CM | POA: Diagnosis not present

## 2017-12-21 DIAGNOSIS — Z3480 Encounter for supervision of other normal pregnancy, unspecified trimester: Secondary | ICD-10-CM | POA: Diagnosis not present

## 2017-12-21 DIAGNOSIS — Z23 Encounter for immunization: Secondary | ICD-10-CM | POA: Diagnosis not present

## 2017-12-22 DIAGNOSIS — B349 Viral infection, unspecified: Secondary | ICD-10-CM | POA: Diagnosis not present

## 2017-12-22 DIAGNOSIS — Z3480 Encounter for supervision of other normal pregnancy, unspecified trimester: Secondary | ICD-10-CM | POA: Diagnosis not present

## 2017-12-23 ENCOUNTER — Encounter (HOSPITAL_COMMUNITY): Payer: Self-pay | Admitting: *Deleted

## 2017-12-23 ENCOUNTER — Inpatient Hospital Stay (HOSPITAL_COMMUNITY)
Admission: AD | Admit: 2017-12-23 | Discharge: 2017-12-23 | Disposition: A | Discharge status: Home / Self Care | Payer: BLUE CROSS/BLUE SHIELD | Source: Ambulatory Visit | Attending: Obstetrics and Gynecology | Admitting: Obstetrics and Gynecology

## 2017-12-23 ENCOUNTER — Other Ambulatory Visit: Payer: Self-pay

## 2017-12-23 DIAGNOSIS — N930 Postcoital and contact bleeding: Secondary | ICD-10-CM | POA: Diagnosis not present

## 2017-12-23 DIAGNOSIS — O26893 Other specified pregnancy related conditions, third trimester: Secondary | ICD-10-CM

## 2017-12-23 DIAGNOSIS — O4693 Antepartum hemorrhage, unspecified, third trimester: Secondary | ICD-10-CM | POA: Diagnosis not present

## 2017-12-23 DIAGNOSIS — Z3A36 36 weeks gestation of pregnancy: Secondary | ICD-10-CM | POA: Insufficient documentation

## 2017-12-23 NOTE — MAU Provider Note (Signed)
Chief Complaint:  Vaginal Bleeding   First Provider Initiated Contact with Patient 12/23/17 2038      HPI: Anna Vasquez is a 32 y.o. G1P0 at [redacted]w[redacted]d who presents to maternity admissions reporting bright red vaginal bleeding after intercourse today.  She reports that after intercourse she went to the bathroom and there was enough blood to fill the toilet with red.  Her husband was also covered with blood afterwards.  She denies any contractions or any other pain. The baby is moving well. Since arrival in MAU, she has not seen any more bleeding. There are no other symptoms. She has not tried any treatments.     HPI  Past Medical History: Past Medical History:  Diagnosis Date  . Frequent headaches   . GERD (gastroesophageal reflux disease)   . H/O febrile seizure   . Hx of migraines   . Hypothyroidism   . Hypothyroidism   . PCOD (polycystic ovarian disease)   . Pseudotumor cerebri   . Thoracic outlet syndrome    extra cervical ribs  . Vitamin D deficiency     Past obstetric history: OB History  Gravida Para Term Preterm AB Living  1            SAB TAB Ectopic Multiple Live Births               # Outcome Date GA Lbr Len/2nd Weight Sex Delivery Anes PTL Lv  1 Current             Past Surgical History: Past Surgical History:  Procedure Laterality Date  . CARPAL TUNNEL RELEASE     rt and left  . LAPAROSCOPIC GASTRIC SLEEVE RESECTION    . MOUTH SURGERY  1998    Family History: Family History  Problem Relation Age of Onset  . Arthritis Father   . Hypertension Father   . Sleep apnea Father   . Transient ischemic attack Father   . Rheum arthritis Mother   . Breast cancer Paternal Aunt   . Mental illness Paternal Uncle   . Prostate cancer Paternal Grandfather     Social History: Social History   Tobacco Use  . Smoking status: Never Smoker  . Smokeless tobacco: Never Used  Substance Use Topics  . Alcohol use: Yes    Alcohol/week: 0.0 standard drinks  . Drug  use: No    Allergies:  Allergies  Allergen Reactions  . Tetracyclines & Related     Meds:  Medications Prior to Admission  Medication Sig Dispense Refill Last Dose  . acetaminophen (TYLENOL) 325 MG tablet Take 650 mg by mouth every 6 (six) hours as needed for pain.   Past Week at Unknown time  . SYNTHROID 125 MCG tablet   1 12/23/2017 at Unknown time  . valACYclovir (VALTREX) 1000 MG tablet TAKE 1 TABLET BY MOUTH TWICE A DAY FOR 3 DAYS  0 Taking    ROS:  Review of Systems  Constitutional: Negative for chills, fatigue and fever.  Eyes: Negative for visual disturbance.  Respiratory: Negative for shortness of breath.   Cardiovascular: Negative for chest pain.  Gastrointestinal: Negative for abdominal pain, nausea and vomiting.  Genitourinary: Positive for vaginal bleeding. Negative for difficulty urinating, dysuria, flank pain, pelvic pain, vaginal discharge and vaginal pain.  Neurological: Negative for dizziness and headaches.  Psychiatric/Behavioral: Negative.      I have reviewed patient's Past Medical Hx, Surgical Hx, Family Hx, Social Hx, medications and allergies.   Physical Exam  Patient Vitals for the past 24 hrs:  BP Temp Temp src Pulse Resp SpO2 Weight  12/23/17 2119 123/67 - - 71 - - -  12/23/17 2118 - - - - 16 - -  12/23/17 1829 124/79 98.3 F (36.8 C) Oral 70 17 100 % 111.9 kg   Constitutional: Well-developed, well-nourished female in no acute distress.  Cardiovascular: normal rate Respiratory: normal effort GI: Abd soft, non-tender, gravid appropriate for gestational age.  MS: Extremities nontender, no edema, normal ROM Neurologic: Alert and oriented x 4.  GU: Neg CVAT.  PELVIC EXAM: Cervix pink, visually closed, without lesion, small amount dark red bleeding soaking 1/2 of one fox swab, no additional bleeding seen after swab, vaginal walls and external genitalia normal      FHT:  Baseline 135 , moderate variability, accelerations present, no  decelerations Contractions: rare, mild to palpation   Labs: No results found for this or any previous visit (from the past 24 hour(s)).    Imaging:  No results found.  MAU Course/MDM: I have ordered labs and reviewed results. Reviewed pt prenatal records, pt with normal [place], Rh negative with Rhogam administered @ 28 weeks  NST reviewed and reactive Bleeding is small now, significantly less than after initial bleed at home Likely cervical bleeding following intercourse Consult Dr Macon Large with presentation, exam findings and test results.  D/C home, keep appts in the office as scheduled, return to MAU as needed for emergencies Pt discharge with strict bleeding precautions.    Assessment: 1. Postcoital bleeding   2. Vaginal bleeding in pregnancy, third trimester     Plan: Discharge home Labor precautions and fetal kick counts Follow-up Information    [place] Follow up.   Why:  As scheduled, return to MAU as needed for signs of labor or emergencies. Contact information: [address] [place] [address] [phone]          Allergies as of 12/23/2017      Reactions   Tetracyclines & Related       Medication List    TAKE these medications   acetaminophen 325 MG tablet Commonly known as:  TYLENOL Take 650 mg by mouth every 6 (six) hours as needed for pain.   SYNTHROID 125 MCG tablet Generic drug:  levothyroxine   valACYclovir 1000 MG tablet Commonly known as:  VALTREX TAKE 1 TABLET BY MOUTH TWICE A DAY FOR 3 DAYS       Sharen Counter Certified Nurse-Midwife 12/23/2017 9:19 PM

## 2017-12-23 NOTE — MAU Note (Signed)
Had excessive bleeding after intercourse.  Called the nurse line and was told to come in. No pain.  First episode of bleeding. No placental issues on [place]

## 2018-01-12 ENCOUNTER — Telehealth (HOSPITAL_COMMUNITY): Payer: Self-pay | Admitting: *Deleted

## 2018-01-12 ENCOUNTER — Encounter (HOSPITAL_COMMUNITY): Payer: Self-pay | Admitting: *Deleted

## 2018-01-12 LAB — OB RESULTS CONSOLE GBS: GBS: NEGATIVE

## 2018-01-12 NOTE — Telephone Encounter (Signed)
Preadmission screen  

## 2018-01-20 ENCOUNTER — Inpatient Hospital Stay (HOSPITAL_COMMUNITY): Admission: RE | Admit: 2018-01-20 | Payer: BLUE CROSS/BLUE SHIELD | Source: Ambulatory Visit

## 2018-01-26 ENCOUNTER — Inpatient Hospital Stay (HOSPITAL_COMMUNITY)
Admission: RE | Admit: 2018-01-26 | Discharge: 2018-01-28 | DRG: 807 | Disposition: A | Discharge status: Home / Self Care | Payer: BLUE CROSS/BLUE SHIELD | Attending: Obstetrics and Gynecology | Admitting: Obstetrics and Gynecology

## 2018-01-26 ENCOUNTER — Other Ambulatory Visit: Payer: Self-pay

## 2018-01-26 ENCOUNTER — Encounter (HOSPITAL_COMMUNITY): Payer: Self-pay

## 2018-01-26 DIAGNOSIS — Z3A41 41 weeks gestation of pregnancy: Secondary | ICD-10-CM

## 2018-01-26 DIAGNOSIS — O480 Post-term pregnancy: Secondary | ICD-10-CM | POA: Diagnosis not present

## 2018-01-26 DIAGNOSIS — Z23 Encounter for immunization: Secondary | ICD-10-CM | POA: Diagnosis not present

## 2018-01-26 LAB — CBC
HCT: 40.1 % (ref 36.0–46.0)
HEMOGLOBIN: 13.3 g/dL (ref 12.0–15.0)
MCH: 31.5 pg (ref 26.0–34.0)
MCHC: 33.2 g/dL (ref 30.0–36.0)
MCV: 95 fL (ref 80.0–100.0)
Platelets: 204 10*3/uL (ref 150–400)
RBC: 4.22 MIL/uL (ref 3.87–5.11)
RDW: 13.6 % (ref 11.5–15.5)
WBC: 13.1 10*3/uL — ABNORMAL HIGH (ref 4.0–10.5)
nRBC: 0 % (ref 0.0–0.2)

## 2018-01-26 LAB — TYPE AND SCREEN
ABO/RH(D): O NEG
Antibody Screen: NEGATIVE

## 2018-01-26 LAB — ABO/RH: ABO/RH(D): O NEG

## 2018-01-26 LAB — RPR: RPR: NONREACTIVE

## 2018-01-26 MED ORDER — LEVOTHYROXINE SODIUM 125 MCG PO TABS
125.0000 ug | ORAL_TABLET | Freq: Every day | ORAL | Status: DC
  Administered 2018-01-27 – 2018-01-28 (×2): 125 ug via ORAL
  Filled 2018-01-26 (×2): qty 1

## 2018-01-26 MED ORDER — OXYCODONE-ACETAMINOPHEN 5-325 MG PO TABS: 1.0000 | ORAL_TABLET | ORAL | Status: DC | PRN

## 2018-01-26 MED ORDER — OXYTOCIN 40 UNITS IN LACTATED RINGERS INFUSION - SIMPLE MED: 2.5000 [IU]/h | INTRAVENOUS | Status: DC

## 2018-01-26 MED ORDER — OXYCODONE-ACETAMINOPHEN 5-325 MG PO TABS: 2.0000 | ORAL_TABLET | ORAL | Status: DC | PRN

## 2018-01-26 MED ORDER — MEDROXYPROGESTERONE ACETATE 150 MG/ML IM SUSP: 150.0000 mg | INTRAMUSCULAR | Status: DC | PRN

## 2018-01-26 MED ORDER — COCONUT OIL OIL: 1.0000 "application " | TOPICAL_OIL | Status: DC | PRN

## 2018-01-26 MED ORDER — LACTATED RINGERS IV SOLN: 500.0000 mL | INTRAVENOUS | Status: DC | PRN

## 2018-01-26 MED ORDER — IBUPROFEN 600 MG PO TABS
600.0000 mg | ORAL_TABLET | Freq: Four times a day (QID) | ORAL | Status: DC
  Administered 2018-01-26 – 2018-01-28 (×7): 600 mg via ORAL
  Filled 2018-01-26 (×7): qty 1

## 2018-01-26 MED ORDER — MISOPROSTOL 25 MCG QUARTER TABLET
25.0000 ug | ORAL_TABLET | ORAL | Status: DC | PRN
  Filled 2018-01-26: qty 1

## 2018-01-26 MED ORDER — PRENATAL MULTIVITAMIN CH
1.0000 | ORAL_TABLET | Freq: Every day | ORAL | Status: DC
  Administered 2018-01-27: 1 via ORAL
  Filled 2018-01-26: qty 1

## 2018-01-26 MED ORDER — SOD CITRATE-CITRIC ACID 500-334 MG/5ML PO SOLN: 30.0000 mL | ORAL | Status: DC | PRN

## 2018-01-26 MED ORDER — TETANUS-DIPHTH-ACELL PERTUSSIS 5-2.5-18.5 LF-MCG/0.5 IM SUSP: 0.5000 mL | Freq: Once | INTRAMUSCULAR | Status: DC

## 2018-01-26 MED ORDER — MEASLES, MUMPS & RUBELLA VAC IJ SOLR: 0.5000 mL | Freq: Once | INTRAMUSCULAR | Status: DC

## 2018-01-26 MED ORDER — ONDANSETRON HCL 4 MG PO TABS: 4.0000 mg | ORAL_TABLET | ORAL | Status: DC | PRN

## 2018-01-26 MED ORDER — ACETAMINOPHEN 325 MG PO TABS: 650.0000 mg | ORAL_TABLET | ORAL | Status: DC | PRN

## 2018-01-26 MED ORDER — LIDOCAINE HCL (PF) 1 % IJ SOLN
30.0000 mL | INTRAMUSCULAR | Status: DC | PRN
  Administered 2018-01-26: 30 mL via SUBCUTANEOUS
  Filled 2018-01-26: qty 30

## 2018-01-26 MED ORDER — TERBUTALINE SULFATE 1 MG/ML IJ SOLN
0.2500 mg | Freq: Once | INTRAMUSCULAR | Status: DC | PRN
  Filled 2018-01-26: qty 1

## 2018-01-26 MED ORDER — BISACODYL 10 MG RE SUPP: 10.0000 mg | Freq: Every day | RECTAL | Status: DC | PRN

## 2018-01-26 MED ORDER — OXYTOCIN 40 UNITS IN LACTATED RINGERS INFUSION - SIMPLE MED
1.0000 m[IU]/min | INTRAVENOUS | Status: DC
  Administered 2018-01-26: 4 m[IU]/min via INTRAVENOUS
  Filled 2018-01-26: qty 1000

## 2018-01-26 MED ORDER — DIBUCAINE 1 % RE OINT: 1.0000 "application " | TOPICAL_OINTMENT | RECTAL | Status: DC | PRN

## 2018-01-26 MED ORDER — OXYTOCIN BOLUS FROM INFUSION
500.0000 mL | Freq: Once | INTRAVENOUS | Status: AC
  Administered 2018-01-26: 500 mL via INTRAVENOUS

## 2018-01-26 MED ORDER — SENNOSIDES-DOCUSATE SODIUM 8.6-50 MG PO TABS
2.0000 | ORAL_TABLET | ORAL | Status: DC
  Administered 2018-01-27 – 2018-01-28 (×2): 2 via ORAL
  Filled 2018-01-26 (×2): qty 2

## 2018-01-26 MED ORDER — WITCH HAZEL-GLYCERIN EX PADS: 1.0000 "application " | MEDICATED_PAD | CUTANEOUS | Status: DC | PRN

## 2018-01-26 MED ORDER — ZOLPIDEM TARTRATE 5 MG PO TABS: 5.0000 mg | ORAL_TABLET | Freq: Every evening | ORAL | Status: DC | PRN

## 2018-01-26 MED ORDER — ACETAMINOPHEN 325 MG PO TABS
650.0000 mg | ORAL_TABLET | ORAL | Status: DC | PRN
  Administered 2018-01-26: 650 mg via ORAL
  Filled 2018-01-26: qty 2

## 2018-01-26 MED ORDER — ONDANSETRON HCL 4 MG/2ML IJ SOLN: 4.0000 mg | INTRAMUSCULAR | Status: DC | PRN

## 2018-01-26 MED ORDER — ONDANSETRON HCL 4 MG/2ML IJ SOLN: 4.0000 mg | Freq: Four times a day (QID) | INTRAMUSCULAR | Status: DC | PRN

## 2018-01-26 MED ORDER — LACTATED RINGERS IV SOLN
INTRAVENOUS | Status: DC
  Administered 2018-01-26 (×2): via INTRAVENOUS

## 2018-01-26 MED ORDER — SIMETHICONE 80 MG PO CHEW: 80.0000 mg | CHEWABLE_TABLET | ORAL | Status: DC | PRN

## 2018-01-26 MED ORDER — FLEET ENEMA 7-19 GM/118ML RE ENEM: 1.0000 | ENEMA | Freq: Every day | RECTAL | Status: DC | PRN

## 2018-01-26 MED ORDER — BENZOCAINE-MENTHOL 20-0.5 % EX AERO
1.0000 "application " | INHALATION_SPRAY | CUTANEOUS | Status: DC | PRN
  Administered 2018-01-26: 1 via TOPICAL
  Filled 2018-01-26: qty 56

## 2018-01-26 MED ORDER — DIPHENHYDRAMINE HCL 25 MG PO CAPS: 25.0000 mg | ORAL_CAPSULE | Freq: Four times a day (QID) | ORAL | Status: DC | PRN

## 2018-01-26 NOTE — Anesthesia Pain Management Evaluation Note (Signed)
  CR[facility] Pain Management Visit Note  Patient: Anna Vasquez, 32 y.o., female  "Hello I am a member of the anesthesia team at [facility]. We have an anesthesia team available at all times to provide care throughout the hospital, including epidural management and anesthesia for C-section. I don't know your plan for the delivery whether it a natural birth, water birth, IV sedation, nitrous supplementation, doula or epidural, but we want to meet your pain goals."   1.Was your pain managed to your expectations on prior hospitalizations?   No prior hospitalizations  2.What is your expectation for pain management during this hospitalization?     Patient wants to avoid epidural and try IV pain meds forst  3.How can we help you reach that goal? Epidural if desired  Record the patient's initial score and the patient's pain goal.   Pain: 4  Pain Goal: 8 [facility] wants you to be able to say your pain was always managed very well.  [name] 01/26/2018

## 2018-01-26 NOTE — H&P (Signed)
Anna Vasquez is a 32 y.o. G 1 P 0 at 41 weeks presents for IOL secondary to post term.  OB History    Gravida  1   Para      Term      Preterm      AB      Living        SAB      TAB      Ectopic      Multiple      Live Births             Past Medical History:  Diagnosis Date  . Frequent headaches   . GERD (gastroesophageal reflux disease)   . H/O febrile seizure   . Hx of migraines   . Hypothyroidism   . Hypothyroidism   . PCOD (polycystic ovarian disease)   . Pseudotumor cerebri   . Thoracic outlet syndrome    extra cervical ribs  . Vitamin D deficiency    Past Surgical History:  Procedure Laterality Date  . CARPAL TUNNEL RELEASE     rt and left  . LAPAROSCOPIC GASTRIC SLEEVE RESECTION    . MOUTH SURGERY  1998   Family History: family history includes Alzheimer's disease in her maternal grandmother; Arthritis in her father; Breast cancer in her paternal aunt; COPD in her father; Esophageal cancer in her paternal uncle; Hypertension in her father; Mental illness in her paternal uncle; Prostate cancer in her paternal grandfather; Rheum arthritis in her mother; Sleep apnea in her father; Transient ischemic attack in her father. Social History:  reports that she has never smoked. She has never used smokeless tobacco. She reports that she drinks alcohol. She reports that she does not use drugs.     Maternal Diabetes: No Genetic Screening: Normal Maternal Ultrasounds/Referrals: Normal Fetal Ultrasounds or other Referrals:  None Maternal Substance Abuse:  No Significant Maternal Medications:  None Significant Maternal Lab Results:  None Other Comments:  None  Review of Systems  All other systems reviewed and are negative.  History   Blood pressure 112/61, pulse 74, temperature 98.2 F (36.8 C), temperature source Oral, resp. rate 18, height 5\' 4"  (1.626 m), weight 114.9 kg. Maternal Exam:  Abdomen: Fetal presentation: vertex     Fetal  Exam Fetal State Assessment: Category I - tracings are normal.     Physical Exam  Nursing note and vitals reviewed. Constitutional: She appears well-developed and well-nourished.  HENT:  Head: Normocephalic.  Eyes: Pupils are equal, round, and reactive to light.  Neck: Normal range of motion.  Cardiovascular: Normal rate and regular rhythm.    Prenatal labs: ABO, Rh: O/Negative/-- (04/02 0000) Antibody: Negative (04/02 0000) Rubella: Immune (04/02 0000) RPR: Nonreactive (04/02 0000)  HBsAg: Negative (04/02 0000)  HIV: Non-reactive (04/02 0000)  GBS: Negative (10/24 0000)   Assessment/Plan: IUP at 41 weeks IOL secondary to post term Pitocin prn  Anticipate NSVD  Anna Vasquez L 01/26/2018, 8:47 AM

## 2018-01-27 LAB — RUBELLA SCREEN: Rubella: 1.56 index (ref >0.99)

## 2018-01-27 LAB — CBC
HEMATOCRIT: 27 % — AB (ref 36.0–46.0)
HEMOGLOBIN: 9.3 g/dL — AB (ref 12.0–15.0)
MCH: 32.2 pg (ref 26.0–34.0)
MCHC: 34.4 g/dL (ref 30.0–36.0)
MCV: 93.4 fL (ref 80.0–100.0)
PLATELETS: 171 10*3/uL (ref 150–400)
RBC: 2.89 MIL/uL — AB (ref 3.87–5.11)
RDW: 13.5 % (ref 11.5–15.5)
WBC: 16.4 10*3/uL — AB (ref 4.0–10.5)
nRBC: 0 % (ref 0.0–0.2)

## 2018-01-27 MED ORDER — IBUPROFEN 600 MG PO TABS: 600.0000 mg | ORAL_TABLET | Freq: Four times a day (QID) | ORAL | 0 refills | Status: DC

## 2018-01-27 NOTE — Discharge Summary (Signed)
Obstetric Discharge Summary Reason for Admission: induction of labor Prenatal Procedures: ultrasound Intrapartum Procedures: vacuum Postpartum Procedures: none Complications-Operative and Postpartum: 2 degree perineal laceration Hemoglobin  Date Value Ref Range Status  01/27/2018 9.3 (L) 12.0 - 15.0 g/dL Final    Comment:    DELTA CHECK NOTED REPEATED TO VERIFY    HGB  Date Value Ref Range Status  07/06/2014 15.8 12.0 - 16.0 g/dL Final   HCT  Date Value Ref Range Status  01/27/2018 27.0 (L) 36.0 - 46.0 % Final  07/06/2014 49.0 (H) 35.0 - 47.0 % Final    Physical Exam:  General: alert and cooperative Lochia: appropriate Uterine Fundus: firm Incision: n/a DVT Evaluation: No evidence of DVT seen on physical exam.  Discharge Diagnoses: Term Pregnancy-delivered  Discharge Information: Date: 01/27/2018 Activity: pelvic rest Diet: routine Medications: PNV and Ibuprofen, synthroid Condition: stable Instructions: refer to practice specific booklet Discharge to: home   Newborn Data: Live born female  Birth Weight: 8 lb 0.9 oz (3654 g) APGAR: 9, 9  Newborn Delivery   Birth date/time:  01/26/2018 15:13:00 Delivery type:  Vaginal, Vacuum (Extractor)     Home with mother.  Anna Vasquez 01/27/2018, 10:08 AM

## 2018-01-27 NOTE — Lactation Note (Signed)
This note was copied from a baby's chart. Lactation Consultation Note  Patient Name: Anna Vasquez Today's Date: 01/27/2018 Reason for consult: Initial assessment;Primapara;1st time breastfeeding;Term  p1 mother whose infant is now 9 hours old  Mother was holding baby on her chest as I arrived.  She feels like breast feeding is going well so far and baby is showing feeding cues.  She denies pain with latching.  Encouraged to feed 8-12 times/24 hours or sooner if baby shows cues.  Mother is familiar with feeding cues and hand expression.  Colostrum container provided for any EBM she may obtain with hand expression.    Mom made aware of O/P services, breastfeeding support groups, community resources, and our phone # for post-discharge questions. Father sleeping.  Mother will call for questions/concerns.   Maternal Data Formula Feeding for Exclusion: No Has patient been taught Hand Expression?: Yes Does the patient have breastfeeding experience prior to this delivery?: No  Feeding Feeding Type: Breast Fed  LATCH Score                   Interventions    Lactation Tools Discussed/Used WIC Program: No   Consult Status Consult Status: Follow-up Date: 01/28/18 Follow-up type: In-patient    Emrie Gayle R Tomeika Weinmann 01/27/2018, 1:06 PM

## 2018-01-28 NOTE — Lactation Note (Signed)
This note was copied from a baby's chart. Lactation Consultation Note: Mom getting ready to latch baby as I went into room. Using football hold. Mom reports pain with initial latch that then eases off in a few minutes- does not hurt through the whole feeding. Assisted with latch- encouraged massage and hand expression before latching. Reviewed wide open mouth and  keeping the baby close to the breast throughout the feeding. Baby nursed for 15 min on left breast. Still showing feeding cues- latched to right breast in cross cradle hold with assist. Mom reports that feels better. Encouraged to change positions throughout day to help nipples to heal. Encouraged EBM to nipples after nursing. Reviewed engorgement prevention and treatment. Has Spectra pump for home. Reviewed our phone number, OP appointments and BFSG as resources for support after DC. No questions at present. To call prn  Patient Name: Anna Vasquez [id number] Date: 01/28/2018 Reason for consult: Follow-up assessment;Primapara   Maternal Data Formula Feeding for Exclusion: No Has patient been taught Hand Expression?: Yes Does the patient have breastfeeding experience prior to this delivery?: No  Feeding Feeding Type: Breast Fed  LATCH Score Latch: Grasps breast easily, tongue down, lips flanged, rhythmical sucking.  Audible Swallowing: A few with stimulation  Type of Nipple: Everted at rest and after stimulation  Comfort (Breast/Nipple): Filling, red/small blisters or bruises, mild/mod discomfort  Hold (Positioning): Assistance needed to correctly position infant at breast and maintain latch.  LATCH Score: 7  Interventions Interventions: Breast feeding basics reviewed;Assisted with latch;Position options;Support pillows;Hand express;Breast compression  Lactation Tools Discussed/Used WIC Program: No   Consult Status Consult Status: Complete    Pamelia Hoit 01/28/2018, 8:19 AM

## 2018-02-08 ENCOUNTER — Ambulatory Visit (INDEPENDENT_AMBULATORY_CARE_PROVIDER_SITE_OTHER): Payer: BLUE CROSS/BLUE SHIELD | Admitting: Internal Medicine

## 2018-02-08 ENCOUNTER — Encounter: Payer: Self-pay | Admitting: Internal Medicine

## 2018-02-08 VITALS — BP 108/78 | HR 61 | Temp 97.6°F | Resp 18 | Wt 235.0 lb

## 2018-02-08 DIAGNOSIS — E039 Hypothyroidism, unspecified: Secondary | ICD-10-CM | POA: Diagnosis not present

## 2018-02-08 DIAGNOSIS — D649 Anemia, unspecified: Secondary | ICD-10-CM

## 2018-02-08 LAB — CBC WITH DIFFERENTIAL/PLATELET
BASOS ABS: 0 10*3/uL (ref 0.0–0.1)
BASOS PCT: 0.5 % (ref 0.0–3.0)
EOS ABS: 0.2 10*3/uL (ref 0.0–0.7)
Eosinophils Relative: 1.7 % (ref 0.0–5.0)
HEMATOCRIT: 35.4 % — AB (ref 36.0–46.0)
Hemoglobin: 11.7 g/dL — ABNORMAL LOW (ref 12.0–15.0)
LYMPHS ABS: 1.5 10*3/uL (ref 0.7–4.0)
Lymphocytes Relative: 14.7 % (ref 12.0–46.0)
MCHC: 33 g/dL (ref 30.0–36.0)
MCV: 94.7 fl (ref 78.0–100.0)
MONO ABS: 0.4 10*3/uL (ref 0.1–1.0)
Monocytes Relative: 3.8 % (ref 3.0–12.0)
NEUTROS ABS: 7.9 10*3/uL — AB (ref 1.4–7.7)
NEUTROS PCT: 79.3 % — AB (ref 43.0–77.0)
PLATELETS: 337 10*3/uL (ref 150.0–400.0)
RBC: 3.74 Mil/uL — ABNORMAL LOW (ref 3.87–5.11)
RDW: 13.8 % (ref 11.5–15.5)
WBC: 9.9 10*3/uL (ref 4.0–10.5)

## 2018-02-08 LAB — TSH: TSH: 0.48 u[IU]/mL (ref 0.35–4.50)

## 2018-02-08 NOTE — Progress Notes (Signed)
Patient ID: Anna Vasquez, female   DOB: 02/17/1986, 32 y.o.   MRN: [id number]   Subjective:    Patient ID: Anna Sensingmanda E Mikulski, female    DOB: 08/28/1985, 32 y.o.   MRN: [id number]  HPI  Patient here for a scheduled follow up.  Was scheduled for a physical, but has seen gyn recently for pelvic, etc.  Just had a baby.  Gave birth 01/26/18 - daughter.  Doing well.  Feels better.  Weight coming down.  Breathing stable.  No sob.  No chest pain.  No acid reflux.  No abdominal pain.  Bowels moving.  Had to have her thyroid adjusted when pregnant.  Wants rechecked. Is breast feeding.  Going well.     Past Medical History:  Diagnosis Date  . Frequent headaches   . GERD (gastroesophageal reflux disease)   . H/O febrile seizure   . Hx of migraines   . Hypothyroidism   . Hypothyroidism   . PCOD (polycystic ovarian disease)   . Pseudotumor cerebri   . Thoracic outlet syndrome    extra cervical ribs  . Vitamin D deficiency    Past Surgical History:  Procedure Laterality Date  . CARPAL TUNNEL RELEASE     rt and left  . LAPAROSCOPIC GASTRIC SLEEVE RESECTION    . MOUTH SURGERY  1998   Family History  Problem Relation Age of Onset  . Arthritis Father   . Hypertension Father   . Sleep apnea Father   . Transient ischemic attack Father   . COPD Father   . Rheum arthritis Mother   . Breast cancer Paternal Aunt   . Mental illness Paternal Uncle   . Esophageal cancer Paternal Uncle   . Prostate cancer Paternal Grandfather   . Alzheimer's disease Maternal Grandmother    Social History   Socioeconomic History  . Marital status: Married    Spouse name: Not on file  . Number of children: Not on file  . Years of education: Not on file  . Highest education level: Not on file  Occupational History  . Not on file  Social Needs  . Financial resource strain: Not hard at all  . Food insecurity:    Worry: Never true    Inability: Never true  . Transportation needs:    Medical: No   Non-medical: Not on file  Tobacco Use  . Smoking status: Never Smoker  . Smokeless tobacco: Never Used  Substance and Sexual Activity  . Alcohol use: Yes    Alcohol/week: 0.0 standard drinks  . Drug use: No  . Sexual activity: Yes  Lifestyle  . Physical activity:    Days per week: Not on file    Minutes per session: Not on file  . Stress: Only a little  Relationships  . Social connections:    Talks on phone: Not on file    Gets together: Not on file    Attends religious service: Not on file    Active member of club or organization: Not on file    Attends meetings of clubs or organizations: Not on file    Relationship status: Not on file  Other Topics Concern  . Not on file  Social History Narrative  . Not on file    Outpatient Encounter Medications as of 02/08/2018  Medication Sig  . Prenatal Vit-Fe Fumarate-FA (PRENATAL MULTIVITAMIN) TABS tablet Take 1 tablet by mouth daily at 12 noon.  Marland Kitchen. SYNTHROID 125 MCG tablet Take 125 mcg by mouth daily  before breakfast.   . [DISCONTINUED] ibuprofen (ADVIL,MOTRIN) 600 MG tablet Take 1 tablet (600 mg total) by mouth every 6 (six) hours.   No facility-administered encounter medications on file as of 02/08/2018.     Review of Systems  Constitutional: Negative for appetite change and unexpected weight change.  HENT: Negative for congestion and sinus pressure.   Respiratory: Negative for cough, chest tightness and shortness of breath.   Cardiovascular: Negative for chest pain, palpitations and leg swelling.  Gastrointestinal: Negative for abdominal pain, diarrhea, nausea and vomiting.  Genitourinary: Negative for difficulty urinating and dysuria.  Musculoskeletal: Negative for joint swelling and myalgias.  Skin: Negative for color change and rash.  Neurological: Negative for dizziness, light-headedness and headaches.  Psychiatric/Behavioral: Negative for agitation and dysphoric mood.       Objective:    Physical Exam    Constitutional: She appears well-developed and well-nourished. No distress.  HENT:  Nose: Nose normal.  Mouth/Throat: Oropharynx is clear and moist.  Neck: Neck supple. No thyromegaly present.  Cardiovascular: Normal rate and regular rhythm.  Pulmonary/Chest: Breath sounds normal. No respiratory distress. She has no wheezes.  Abdominal: Soft. Bowel sounds are normal. There is no tenderness.  Musculoskeletal: She exhibits no edema or tenderness.  Lymphadenopathy:    She has no cervical adenopathy.  Skin: No rash noted. No erythema.  Psychiatric: She has a normal mood and affect. Her behavior is normal.    BP 108/78 (BP Location: Left Arm, Patient Position: Sitting, Cuff Size: Normal)   Pulse 61   Temp 97.6 F (36.4 C) (Oral)   Resp 18   Wt 235 lb (106.6 kg)   SpO2 98%   BMI 40.34 kg/m  Wt Readings from Last 3 Encounters:  02/08/18 235 lb (106.6 kg)  01/26/18 253 lb 4.8 oz (114.9 kg)  12/23/17 246 lb 12 oz (111.9 kg)     Lab Results  Component Value Date   WBC 9.9 02/08/2018   HGB 11.7 (L) 02/08/2018   HCT 35.4 (L) 02/08/2018   PLT 337.0 02/08/2018   GLUCOSE 88 02/07/2017   CHOL 184 03/07/2015   TRIG 71.0 03/07/2015   HDL 63.60 03/07/2015   LDLCALC 106 (H) 03/07/2015   ALT 18 02/07/2017   AST 21 02/07/2017   NA 138 02/07/2017   K 4.4 02/07/2017   CL 105 02/07/2017   CREATININE 0.77 02/07/2017   BUN 12 02/07/2017   CO2 28 02/07/2017   TSH 0.48 02/08/2018   INR 1.0 08/25/2012   HGBA1C 5.2 08/25/2012       Assessment & Plan:   Problem List Items Addressed This Visit    Anemia    Noted with pregnancy.  Recheck cbc to confirm improved.  Follow.        Relevant Orders   CBC with Differential/Platelet (Completed)   Hypothyroidism - Primary    On thyroid replacement.  Follow tsh.        Relevant Orders   TSH (Completed)       Dale Dinosaur, MD

## 2018-02-09 ENCOUNTER — Encounter: Payer: Self-pay | Admitting: Internal Medicine

## 2018-02-09 ENCOUNTER — Other Ambulatory Visit: Payer: Self-pay | Admitting: Internal Medicine

## 2018-02-09 DIAGNOSIS — E039 Hypothyroidism, unspecified: Secondary | ICD-10-CM

## 2018-02-09 NOTE — Progress Notes (Signed)
Order placed for f/u tsh.  

## 2018-02-12 ENCOUNTER — Encounter: Payer: Self-pay | Admitting: Internal Medicine

## 2018-02-12 DIAGNOSIS — D649 Anemia, unspecified: Secondary | ICD-10-CM | POA: Insufficient documentation

## 2018-02-12 NOTE — Assessment & Plan Note (Signed)
Noted with pregnancy.  Recheck cbc to confirm improved.  Follow.

## 2018-02-12 NOTE — Assessment & Plan Note (Signed)
On thyroid replacement.  Follow tsh.  

## 2018-03-09 DIAGNOSIS — Z1389 Encounter for screening for other disorder: Secondary | ICD-10-CM | POA: Diagnosis not present

## 2018-05-16 ENCOUNTER — Other Ambulatory Visit (INDEPENDENT_AMBULATORY_CARE_PROVIDER_SITE_OTHER): Payer: BLUE CROSS/BLUE SHIELD

## 2018-05-16 DIAGNOSIS — E039 Hypothyroidism, unspecified: Secondary | ICD-10-CM | POA: Diagnosis not present

## 2018-05-18 LAB — TSH: TSH: 0.22 u[IU]/mL — ABNORMAL LOW (ref 0.35–4.50)

## 2018-05-19 ENCOUNTER — Other Ambulatory Visit: Payer: Self-pay | Admitting: Internal Medicine

## 2018-05-19 ENCOUNTER — Telehealth: Payer: Self-pay | Admitting: Internal Medicine

## 2018-05-19 ENCOUNTER — Other Ambulatory Visit: Payer: Self-pay

## 2018-05-19 DIAGNOSIS — E039 Hypothyroidism, unspecified: Secondary | ICD-10-CM

## 2018-05-19 MED ORDER — LEVOTHYROXINE SODIUM 112 MCG PO TABS: 112.0000 ug | ORAL_TABLET | Freq: Every day | ORAL | 1 refills | Status: DC

## 2018-05-19 NOTE — Progress Notes (Signed)
Order placed for f/u tsh.  

## 2018-05-19 NOTE — Telephone Encounter (Signed)
Copied from CRM [id number]. Topic: Quick Communication - Lab Results (Clinic Use ONLY) >> May 19, 2018  8:40 AM Larry Sierras, LPN wrote: Called patient to inform them of lab results. When patient returns call, triage nurse may disclose results.  Pt calling back for lab results [phone]

## 2018-06-27 ENCOUNTER — Other Ambulatory Visit (INDEPENDENT_AMBULATORY_CARE_PROVIDER_SITE_OTHER): Payer: BLUE CROSS/BLUE SHIELD

## 2018-06-27 ENCOUNTER — Other Ambulatory Visit: Payer: Self-pay

## 2018-06-27 DIAGNOSIS — E039 Hypothyroidism, unspecified: Secondary | ICD-10-CM

## 2018-06-28 ENCOUNTER — Encounter: Payer: Self-pay | Admitting: Internal Medicine

## 2018-06-28 LAB — TSH: TSH: 1.07 u[IU]/mL (ref 0.35–4.50)

## 2018-08-17 ENCOUNTER — Ambulatory Visit: Payer: BLUE CROSS/BLUE SHIELD | Admitting: Internal Medicine

## 2018-11-20 ENCOUNTER — Other Ambulatory Visit: Payer: Self-pay | Admitting: Internal Medicine

## 2019-02-12 DIAGNOSIS — Z6841 Body Mass Index (BMI) 40.0 and over, adult: Secondary | ICD-10-CM | POA: Diagnosis not present

## 2019-02-12 DIAGNOSIS — Z124 Encounter for screening for malignant neoplasm of cervix: Secondary | ICD-10-CM | POA: Diagnosis not present

## 2019-02-12 DIAGNOSIS — Z01419 Encounter for gynecological examination (general) (routine) without abnormal findings: Secondary | ICD-10-CM | POA: Diagnosis not present

## 2019-02-12 DIAGNOSIS — E039 Hypothyroidism, unspecified: Secondary | ICD-10-CM | POA: Diagnosis not present

## 2019-02-12 DIAGNOSIS — Z1151 Encounter for screening for human papillomavirus (HPV): Secondary | ICD-10-CM | POA: Diagnosis not present

## 2019-02-12 LAB — TSH: TSH: 1.89 (ref 0.41–5.90)

## 2019-02-19 ENCOUNTER — Other Ambulatory Visit: Payer: Self-pay | Admitting: Internal Medicine

## 2019-02-19 NOTE — Telephone Encounter (Signed)
Refilled: 11/21/2018 Last OV: 02/08/2018 Next OV: not scheduled Last TSH: 06/27/2018

## 2019-02-19 NOTE — Telephone Encounter (Signed)
Sent in prescription for synthroid.  Needs f/u appt scheduled with me.  Ok if doxy.  Last evaluated 01/2018.

## 2019-02-22 ENCOUNTER — Encounter: Payer: Self-pay | Admitting: Internal Medicine

## 2019-02-22 NOTE — Telephone Encounter (Signed)
Pt scheduled  

## 2019-02-23 ENCOUNTER — Encounter: Payer: Self-pay | Admitting: Internal Medicine

## 2019-02-23 ENCOUNTER — Other Ambulatory Visit: Payer: Self-pay

## 2019-02-23 ENCOUNTER — Ambulatory Visit (INDEPENDENT_AMBULATORY_CARE_PROVIDER_SITE_OTHER): Payer: BC Managed Care – PPO | Admitting: Internal Medicine

## 2019-02-23 DIAGNOSIS — D649 Anemia, unspecified: Secondary | ICD-10-CM | POA: Diagnosis not present

## 2019-02-23 DIAGNOSIS — E039 Hypothyroidism, unspecified: Secondary | ICD-10-CM

## 2019-02-23 NOTE — Assessment & Plan Note (Signed)
Noted with pregnancy.  Last hgb better.  Discussed with her today.  Will follow cbc and ferritin.

## 2019-02-23 NOTE — Progress Notes (Signed)
Patient ID: Anna Vasquez, female   DOB: 03-06-1986, 33 y.o.   MRN: [id number]   Virtual Visit via video Note  This visit type was conducted due to national recommendations for restrictions regarding the COVID-19 pandemic (e.g. social distancing).  This format is felt to be most appropriate for this patient at this time.  All issues noted in this document were discussed and addressed.  No physical exam was performed (except for noted visual exam findings with Video Visits).   I connected with Philip Aspen by a video enabled telemedicine application and verified that I am speaking with the correct person using two identifiers. Location patient: home Location provider: work Persons participating in the virtual visit: patient, provider  I discussed the limitations, risks, security and privacy concerns of performing an evaluation and management service by video and the availability of in person appointments.  The patient expressed understanding and agreed to proceed.   Reason for visit: scheduled follow up.    HPI: She reports she is doing relatively well.  Her daughter recently turned 75 year old. Doing well.  Her sister and her parents had covid.  Her sister is doing much better.  Her parents are gradually improving.  She is trying to walk some and stay active.  Has not been exercising regularly.  No chest pain or sob reported.  No acid reflux.  No abdominal pain.  Bowels moving.  Handling stress.  Working. Planning to move.  Has stopped breast feeding.  Periods are regular and a little heavy.  Up to date with gyn visits.  Just had thyroid function tests checked and - wnl.     ROS: See pertinent positives and negatives per HPI.  Past Medical History:  Diagnosis Date  . Frequent headaches   . GERD (gastroesophageal reflux disease)   . H/O febrile seizure   . Hx of migraines   . Hypothyroidism   . Hypothyroidism   . PCOD (polycystic ovarian disease)   . Pseudotumor cerebri   . Thoracic  outlet syndrome    extra cervical ribs  . Vitamin D deficiency     Past Surgical History:  Procedure Laterality Date  . CARPAL TUNNEL RELEASE     rt and left  . LAPAROSCOPIC GASTRIC SLEEVE RESECTION    . MOUTH SURGERY  1998    Family History  Problem Relation Age of Onset  . Arthritis Father   . Hypertension Father   . Sleep apnea Father   . Transient ischemic attack Father   . COPD Father   . Rheum arthritis Mother   . Breast cancer Paternal Aunt   . Mental illness Paternal Uncle   . Esophageal cancer Paternal Uncle   . Prostate cancer Paternal Grandfather   . Alzheimer's disease Maternal Grandmother     SOCIAL HX: reviewed.    Current Outpatient Medications:  .  SYNTHROID 112 MCG tablet, Take 1 tablet by mouth once daily, Disp: 90 tablet, Rfl: 0  EXAM:  VITALS per patient if applicable:  110/78  GENERAL: alert, oriented, appears well and in no acute distress  HEENT: atraumatic, conjunttiva clear, no obvious abnormalities on inspection of external nose and ears  NECK: normal movements of the head and neck  LUNGS: on inspection no signs of respiratory distress, breathing rate appears normal, no obvious gross SOB, gasping or wheezing  CV: no obvious cyanosis  PSYCH/NEURO: pleasant and cooperative, no obvious depression or anxiety, speech and thought processing grossly intact  ASSESSMENT AND PLAN:  Discussed  the following assessment and plan:  Anemia Noted with pregnancy.  Last hgb better.  Discussed with her today.  Will follow cbc and ferritin.   Hypothyroidism Recently thyroid function tests wnl.  Continue current dose of synthroid.     I discussed the assessment and treatment plan with the patient. The patient was provided an opportunity to ask questions and all were answered. The patient agreed with the plan and demonstrated an understanding of the instructions.   The patient was advised to call back or seek an in-person evaluation if the symptoms  worsen or if the condition fails to improve as anticipated.   Einar Pheasant, MD

## 2019-02-23 NOTE — Assessment & Plan Note (Addendum)
Recently thyroid function tests wnl.  Continue current dose of synthroid.

## 2019-03-23 NOTE — L&D Delivery Note (Signed)
Delivery Note:   G2P1001 at [redacted]w[redacted]d  Admitting diagnosis: Encounter for induction of labor [Z34.90] Indication for care in labor or delivery [O75.9] Risks: BMI >40, Hypothyroidism, marginal cord insertion Onset of labor: 02/04/2020 1542 IOL/Augmentation: AROM, Pitocin and Cytotec ROM: 02/04/2020 @ 1542  Complete dilation at 02/04/2020  1802 Onset of pushing at 1802 FHR second stage: Cat 2, variables with pushing  Analgesia /Anesthesia intrapartum:None  Pushing in lithotomy position with CNM and L&D staff support, Anna Vasquez, Anna Vasquez, present for birth and supportive.  Delivery of a Live born female  Birth Weight:  4156g, 9lb 2.6oz APGAR: 8, 9  Newborn Delivery   Birth date/time: 02/04/2020 18:09:00 Delivery type: Vaginal, Spontaneous      in cephalic presentation, position OA to LOA.  APGAR:1 min-8 , 5 min-9   Nuchal Cord: No  Cord double clamped after cessation of pulsation, cut by Anna Vasquez, Anna Vasquez.  Collection of cord blood for typing completed. Cord blood donation-None  Arterial cord blood sample-No    Placenta delivered-Spontaneous  with 3 vessels . Uterotonics: Pitocin Placenta to L&D, marginal cord insertion noted Uterine tone firm  Bleeding scant  1st degree  laceration identified.  Episiotomy:None  Local analgesia: Lidocaine  Repair: 3-0 Vicryl in usual fashion with excellent hemostasis Est. Blood Loss (mL):75.00   Complications: None  Mom to postpartum.  Baby Anna Vasquez to Couplet care / Skin to Skin.  Delivery Report:   Review the Delivery Report for details.    June Leap, CNM, MSN 02/04/2020, 6:39 PM

## 2019-05-26 ENCOUNTER — Other Ambulatory Visit: Payer: Self-pay | Admitting: Internal Medicine

## 2019-05-28 MED ORDER — SYNTHROID 112 MCG PO TABS: 112.0000 ug | ORAL_TABLET | Freq: Every day | ORAL | 0 refills | Status: DC

## 2019-06-08 DIAGNOSIS — Z3689 Encounter for other specified antenatal screening: Secondary | ICD-10-CM | POA: Diagnosis not present

## 2019-06-08 DIAGNOSIS — Z3200 Encounter for pregnancy test, result unknown: Secondary | ICD-10-CM | POA: Diagnosis not present

## 2019-07-02 DIAGNOSIS — Z3201 Encounter for pregnancy test, result positive: Secondary | ICD-10-CM | POA: Diagnosis not present

## 2019-07-02 DIAGNOSIS — E039 Hypothyroidism, unspecified: Secondary | ICD-10-CM | POA: Diagnosis not present

## 2019-07-20 DIAGNOSIS — Z3401 Encounter for supervision of normal first pregnancy, first trimester: Secondary | ICD-10-CM | POA: Diagnosis not present

## 2019-07-20 DIAGNOSIS — Z113 Encounter for screening for infections with a predominantly sexual mode of transmission: Secondary | ICD-10-CM | POA: Diagnosis not present

## 2019-07-20 LAB — OB RESULTS CONSOLE RPR: RPR: NONREACTIVE

## 2019-07-20 LAB — OB RESULTS CONSOLE GBS: GBS: NEGATIVE

## 2019-07-20 LAB — OB RESULTS CONSOLE HIV ANTIBODY (ROUTINE TESTING): HIV: NONREACTIVE

## 2019-07-20 LAB — OB RESULTS CONSOLE RUBELLA ANTIBODY, IGM: Rubella: IMMUNE

## 2019-07-20 LAB — OB RESULTS CONSOLE ABO/RH: RH Type: NEGATIVE

## 2019-07-20 LAB — OB RESULTS CONSOLE ANTIBODY SCREEN: Antibody Screen: NEGATIVE

## 2019-07-20 LAB — OB RESULTS CONSOLE HEPATITIS B SURFACE ANTIGEN: Hepatitis B Surface Ag: NEGATIVE

## 2019-08-15 DIAGNOSIS — Z3402 Encounter for supervision of normal first pregnancy, second trimester: Secondary | ICD-10-CM | POA: Diagnosis not present

## 2019-08-24 DIAGNOSIS — O99282 Endocrine, nutritional and metabolic diseases complicating pregnancy, second trimester: Secondary | ICD-10-CM | POA: Diagnosis not present

## 2019-08-24 DIAGNOSIS — Z3A15 15 weeks gestation of pregnancy: Secondary | ICD-10-CM | POA: Diagnosis not present

## 2019-09-20 DIAGNOSIS — Z3A19 19 weeks gestation of pregnancy: Secondary | ICD-10-CM | POA: Diagnosis not present

## 2019-09-20 DIAGNOSIS — O09522 Supervision of elderly multigravida, second trimester: Secondary | ICD-10-CM | POA: Diagnosis not present

## 2019-09-20 DIAGNOSIS — O99282 Endocrine, nutritional and metabolic diseases complicating pregnancy, second trimester: Secondary | ICD-10-CM | POA: Diagnosis not present

## 2019-09-21 ENCOUNTER — Encounter: Payer: Self-pay | Admitting: Internal Medicine

## 2019-09-24 NOTE — Telephone Encounter (Signed)
Need copy of labs.  Please hold until we receive.

## 2019-09-25 NOTE — Telephone Encounter (Signed)
Labs received. Placed in your folder for review

## 2019-10-12 DIAGNOSIS — Z3A22 22 weeks gestation of pregnancy: Secondary | ICD-10-CM | POA: Diagnosis not present

## 2019-10-12 DIAGNOSIS — O99212 Obesity complicating pregnancy, second trimester: Secondary | ICD-10-CM | POA: Diagnosis not present

## 2019-10-12 DIAGNOSIS — O99282 Endocrine, nutritional and metabolic diseases complicating pregnancy, second trimester: Secondary | ICD-10-CM | POA: Diagnosis not present

## 2019-10-17 DIAGNOSIS — Z3A23 23 weeks gestation of pregnancy: Secondary | ICD-10-CM | POA: Diagnosis not present

## 2019-10-17 DIAGNOSIS — O99282 Endocrine, nutritional and metabolic diseases complicating pregnancy, second trimester: Secondary | ICD-10-CM | POA: Diagnosis not present

## 2019-10-29 DIAGNOSIS — B079 Viral wart, unspecified: Secondary | ICD-10-CM | POA: Diagnosis not present

## 2019-10-29 DIAGNOSIS — L301 Dyshidrosis [pompholyx]: Secondary | ICD-10-CM | POA: Diagnosis not present

## 2019-11-06 ENCOUNTER — Encounter: Payer: Self-pay | Admitting: Internal Medicine

## 2019-11-19 DIAGNOSIS — Z23 Encounter for immunization: Secondary | ICD-10-CM | POA: Diagnosis not present

## 2019-11-19 DIAGNOSIS — Z3689 Encounter for other specified antenatal screening: Secondary | ICD-10-CM | POA: Diagnosis not present

## 2019-11-19 DIAGNOSIS — O99283 Endocrine, nutritional and metabolic diseases complicating pregnancy, third trimester: Secondary | ICD-10-CM | POA: Diagnosis not present

## 2019-11-19 DIAGNOSIS — Z3A28 28 weeks gestation of pregnancy: Secondary | ICD-10-CM | POA: Diagnosis not present

## 2019-11-19 DIAGNOSIS — O360130 Maternal care for anti-D [Rh] antibodies, third trimester, not applicable or unspecified: Secondary | ICD-10-CM | POA: Diagnosis not present

## 2019-11-22 DIAGNOSIS — B079 Viral wart, unspecified: Secondary | ICD-10-CM | POA: Diagnosis not present

## 2019-12-07 DIAGNOSIS — O99213 Obesity complicating pregnancy, third trimester: Secondary | ICD-10-CM | POA: Diagnosis not present

## 2019-12-07 DIAGNOSIS — O99283 Endocrine, nutritional and metabolic diseases complicating pregnancy, third trimester: Secondary | ICD-10-CM | POA: Diagnosis not present

## 2019-12-07 DIAGNOSIS — Z3A30 30 weeks gestation of pregnancy: Secondary | ICD-10-CM | POA: Diagnosis not present

## 2019-12-13 DIAGNOSIS — B079 Viral wart, unspecified: Secondary | ICD-10-CM | POA: Diagnosis not present

## 2019-12-20 DIAGNOSIS — O99283 Endocrine, nutritional and metabolic diseases complicating pregnancy, third trimester: Secondary | ICD-10-CM | POA: Diagnosis not present

## 2019-12-20 DIAGNOSIS — Z3A32 32 weeks gestation of pregnancy: Secondary | ICD-10-CM | POA: Diagnosis not present

## 2019-12-20 DIAGNOSIS — O99213 Obesity complicating pregnancy, third trimester: Secondary | ICD-10-CM | POA: Diagnosis not present

## 2020-01-02 DIAGNOSIS — Z3A34 34 weeks gestation of pregnancy: Secondary | ICD-10-CM | POA: Diagnosis not present

## 2020-01-02 DIAGNOSIS — O99213 Obesity complicating pregnancy, third trimester: Secondary | ICD-10-CM | POA: Diagnosis not present

## 2020-01-02 DIAGNOSIS — O99283 Endocrine, nutritional and metabolic diseases complicating pregnancy, third trimester: Secondary | ICD-10-CM | POA: Diagnosis not present

## 2020-01-17 DIAGNOSIS — Z3A36 36 weeks gestation of pregnancy: Secondary | ICD-10-CM | POA: Diagnosis not present

## 2020-01-17 DIAGNOSIS — Z3685 Encounter for antenatal screening for Streptococcus B: Secondary | ICD-10-CM | POA: Diagnosis not present

## 2020-01-17 DIAGNOSIS — O99213 Obesity complicating pregnancy, third trimester: Secondary | ICD-10-CM | POA: Diagnosis not present

## 2020-01-17 DIAGNOSIS — O99283 Endocrine, nutritional and metabolic diseases complicating pregnancy, third trimester: Secondary | ICD-10-CM | POA: Diagnosis not present

## 2020-01-22 DIAGNOSIS — O133 Gestational [pregnancy-induced] hypertension without significant proteinuria, third trimester: Secondary | ICD-10-CM | POA: Diagnosis not present

## 2020-01-22 DIAGNOSIS — O99283 Endocrine, nutritional and metabolic diseases complicating pregnancy, third trimester: Secondary | ICD-10-CM | POA: Diagnosis not present

## 2020-01-22 DIAGNOSIS — Z3A37 37 weeks gestation of pregnancy: Secondary | ICD-10-CM | POA: Diagnosis not present

## 2020-01-22 DIAGNOSIS — O99213 Obesity complicating pregnancy, third trimester: Secondary | ICD-10-CM | POA: Diagnosis not present

## 2020-01-28 DIAGNOSIS — O133 Gestational [pregnancy-induced] hypertension without significant proteinuria, third trimester: Secondary | ICD-10-CM | POA: Diagnosis not present

## 2020-01-28 DIAGNOSIS — Z3A38 38 weeks gestation of pregnancy: Secondary | ICD-10-CM | POA: Diagnosis not present

## 2020-01-28 DIAGNOSIS — O99283 Endocrine, nutritional and metabolic diseases complicating pregnancy, third trimester: Secondary | ICD-10-CM | POA: Diagnosis not present

## 2020-01-29 ENCOUNTER — Telehealth (HOSPITAL_COMMUNITY): Payer: Self-pay | Admitting: *Deleted

## 2020-01-29 ENCOUNTER — Encounter (HOSPITAL_COMMUNITY): Payer: Self-pay | Admitting: *Deleted

## 2020-01-29 NOTE — Telephone Encounter (Signed)
Preadmission screen  

## 2020-01-30 ENCOUNTER — Other Ambulatory Visit: Payer: Self-pay

## 2020-01-30 ENCOUNTER — Telehealth (HOSPITAL_COMMUNITY): Payer: Self-pay | Admitting: *Deleted

## 2020-01-30 ENCOUNTER — Encounter (HOSPITAL_COMMUNITY): Payer: Self-pay | Admitting: *Deleted

## 2020-01-30 NOTE — Telephone Encounter (Signed)
Preadmission screen  

## 2020-02-02 ENCOUNTER — Other Ambulatory Visit (HOSPITAL_COMMUNITY)
Admission: RE | Admit: 2020-02-02 | Discharge: 2020-02-02 | Disposition: A | Discharge status: Home / Self Care | Payer: BC Managed Care – PPO | Source: Ambulatory Visit | Attending: Certified Nurse Midwife | Admitting: Certified Nurse Midwife

## 2020-02-02 ENCOUNTER — Other Ambulatory Visit (HOSPITAL_COMMUNITY): Payer: BC Managed Care – PPO

## 2020-02-02 DIAGNOSIS — G932 Benign intracranial hypertension: Secondary | ICD-10-CM | POA: Diagnosis not present

## 2020-02-02 DIAGNOSIS — Z20822 Contact with and (suspected) exposure to covid-19: Secondary | ICD-10-CM | POA: Diagnosis not present

## 2020-02-02 DIAGNOSIS — O99214 Obesity complicating childbirth: Secondary | ICD-10-CM | POA: Diagnosis not present

## 2020-02-02 DIAGNOSIS — Z6791 Unspecified blood type, Rh negative: Secondary | ICD-10-CM | POA: Diagnosis not present

## 2020-02-02 DIAGNOSIS — O26893 Other specified pregnancy related conditions, third trimester: Secondary | ICD-10-CM | POA: Diagnosis not present

## 2020-02-02 DIAGNOSIS — O43123 Velamentous insertion of umbilical cord, third trimester: Secondary | ICD-10-CM | POA: Diagnosis not present

## 2020-02-02 DIAGNOSIS — Z412 Encounter for routine and ritual male circumcision: Secondary | ICD-10-CM | POA: Diagnosis not present

## 2020-02-02 DIAGNOSIS — O99284 Endocrine, nutritional and metabolic diseases complicating childbirth: Secondary | ICD-10-CM | POA: Diagnosis not present

## 2020-02-02 DIAGNOSIS — O99844 Bariatric surgery status complicating childbirth: Secondary | ICD-10-CM | POA: Diagnosis not present

## 2020-02-02 DIAGNOSIS — E039 Hypothyroidism, unspecified: Secondary | ICD-10-CM | POA: Diagnosis not present

## 2020-02-02 DIAGNOSIS — Z01812 Encounter for preprocedural laboratory examination: Secondary | ICD-10-CM | POA: Insufficient documentation

## 2020-02-02 DIAGNOSIS — Z3A39 39 weeks gestation of pregnancy: Secondary | ICD-10-CM | POA: Diagnosis not present

## 2020-02-02 DIAGNOSIS — E669 Obesity, unspecified: Secondary | ICD-10-CM | POA: Diagnosis not present

## 2020-02-02 DIAGNOSIS — Z23 Encounter for immunization: Secondary | ICD-10-CM | POA: Diagnosis not present

## 2020-02-02 DIAGNOSIS — O99354 Diseases of the nervous system complicating childbirth: Secondary | ICD-10-CM | POA: Diagnosis not present

## 2020-02-02 LAB — SARS CORONAVIRUS 2 (TAT 6-24 HRS): SARS Coronavirus 2: NEGATIVE

## 2020-02-04 ENCOUNTER — Inpatient Hospital Stay (HOSPITAL_COMMUNITY): Payer: BC Managed Care – PPO

## 2020-02-04 ENCOUNTER — Inpatient Hospital Stay (HOSPITAL_COMMUNITY)
Admission: AD | Admit: 2020-02-04 | Discharge: 2020-02-05 | DRG: 806 | Disposition: A | Discharge status: Home / Self Care | Payer: BC Managed Care – PPO | Attending: Obstetrics | Admitting: Obstetrics

## 2020-02-04 ENCOUNTER — Encounter (HOSPITAL_COMMUNITY): Payer: Self-pay | Admitting: Obstetrics and Gynecology

## 2020-02-04 ENCOUNTER — Other Ambulatory Visit: Payer: Self-pay

## 2020-02-04 DIAGNOSIS — Z3A39 39 weeks gestation of pregnancy: Secondary | ICD-10-CM | POA: Diagnosis not present

## 2020-02-04 DIAGNOSIS — Z6841 Body Mass Index (BMI) 40.0 and over, adult: Secondary | ICD-10-CM

## 2020-02-04 DIAGNOSIS — E039 Hypothyroidism, unspecified: Secondary | ICD-10-CM | POA: Diagnosis present

## 2020-02-04 DIAGNOSIS — G932 Benign intracranial hypertension: Secondary | ICD-10-CM | POA: Diagnosis present

## 2020-02-04 DIAGNOSIS — Z6791 Unspecified blood type, Rh negative: Secondary | ICD-10-CM

## 2020-02-04 DIAGNOSIS — O99844 Bariatric surgery status complicating childbirth: Secondary | ICD-10-CM | POA: Diagnosis present

## 2020-02-04 DIAGNOSIS — O99354 Diseases of the nervous system complicating childbirth: Secondary | ICD-10-CM | POA: Diagnosis present

## 2020-02-04 DIAGNOSIS — O26893 Other specified pregnancy related conditions, third trimester: Secondary | ICD-10-CM | POA: Diagnosis present

## 2020-02-04 DIAGNOSIS — Z6834 Body mass index (BMI) 34.0-34.9, adult: Secondary | ICD-10-CM

## 2020-02-04 DIAGNOSIS — O99284 Endocrine, nutritional and metabolic diseases complicating childbirth: Secondary | ICD-10-CM | POA: Diagnosis present

## 2020-02-04 DIAGNOSIS — E669 Obesity, unspecified: Secondary | ICD-10-CM | POA: Diagnosis present

## 2020-02-04 DIAGNOSIS — O99214 Obesity complicating childbirth: Secondary | ICD-10-CM | POA: Diagnosis present

## 2020-02-04 DIAGNOSIS — O43123 Velamentous insertion of umbilical cord, third trimester: Secondary | ICD-10-CM | POA: Diagnosis present

## 2020-02-04 DIAGNOSIS — Z20822 Contact with and (suspected) exposure to covid-19: Secondary | ICD-10-CM | POA: Diagnosis present

## 2020-02-04 DIAGNOSIS — Z23 Encounter for immunization: Secondary | ICD-10-CM | POA: Diagnosis not present

## 2020-02-04 DIAGNOSIS — O26899 Other specified pregnancy related conditions, unspecified trimester: Secondary | ICD-10-CM

## 2020-02-04 DIAGNOSIS — Z3490 Encounter for supervision of normal pregnancy, unspecified, unspecified trimester: Secondary | ICD-10-CM | POA: Diagnosis not present

## 2020-02-04 LAB — CBC
HCT: 36.9 % (ref 36.0–46.0)
Hemoglobin: 12.1 g/dL (ref 12.0–15.0)
MCH: 30.1 pg (ref 26.0–34.0)
MCHC: 32.8 g/dL (ref 30.0–36.0)
MCV: 91.8 fL (ref 80.0–100.0)
Platelets: 257 10*3/uL (ref 150–400)
RBC: 4.02 MIL/uL (ref 3.87–5.11)
RDW: 13.7 % (ref 11.5–15.5)
WBC: 13 10*3/uL — ABNORMAL HIGH (ref 4.0–10.5)
nRBC: 0 % (ref 0.0–0.2)

## 2020-02-04 LAB — RPR: RPR Ser Ql: NONREACTIVE

## 2020-02-04 LAB — TYPE AND SCREEN
ABO/RH(D): O NEG
Antibody Screen: POSITIVE

## 2020-02-04 MED ORDER — SOD CITRATE-CITRIC ACID 500-334 MG/5ML PO SOLN: 30.0000 mL | ORAL | Status: DC | PRN

## 2020-02-04 MED ORDER — MISOPROSTOL 50MCG HALF TABLET
ORAL_TABLET | ORAL | Status: AC
  Filled 2020-02-04: qty 1

## 2020-02-04 MED ORDER — WITCH HAZEL-GLYCERIN EX PADS: 1.0000 "application " | MEDICATED_PAD | CUTANEOUS | Status: DC | PRN

## 2020-02-04 MED ORDER — LEVOTHYROXINE SODIUM 112 MCG PO TABS
112.0000 ug | ORAL_TABLET | Freq: Every day | ORAL | Status: DC
  Administered 2020-02-05: 112 ug via ORAL
  Filled 2020-02-04 (×2): qty 1

## 2020-02-04 MED ORDER — ZOLPIDEM TARTRATE 5 MG PO TABS: 5.0000 mg | ORAL_TABLET | Freq: Every evening | ORAL | Status: DC | PRN

## 2020-02-04 MED ORDER — ACETAMINOPHEN 325 MG PO TABS: 650.0000 mg | ORAL_TABLET | ORAL | Status: DC | PRN

## 2020-02-04 MED ORDER — LACTATED RINGERS IV SOLN: 500.0000 mL | INTRAVENOUS | Status: DC | PRN

## 2020-02-04 MED ORDER — COCONUT OIL OIL: 1.0000 "application " | TOPICAL_OIL | Status: DC | PRN

## 2020-02-04 MED ORDER — ONDANSETRON HCL 4 MG/2ML IJ SOLN: 4.0000 mg | INTRAMUSCULAR | Status: DC | PRN

## 2020-02-04 MED ORDER — DIPHENHYDRAMINE HCL 25 MG PO CAPS: 25.0000 mg | ORAL_CAPSULE | Freq: Four times a day (QID) | ORAL | Status: DC | PRN

## 2020-02-04 MED ORDER — LIDOCAINE HCL (PF) 1 % IJ SOLN
30.0000 mL | INTRAMUSCULAR | Status: DC | PRN
  Filled 2020-02-04: qty 30

## 2020-02-04 MED ORDER — OXYTOCIN BOLUS FROM INFUSION
333.0000 mL | Freq: Once | INTRAVENOUS | Status: AC
  Administered 2020-02-04: 333 mL via INTRAVENOUS

## 2020-02-04 MED ORDER — OXYCODONE-ACETAMINOPHEN 5-325 MG PO TABS: 2.0000 | ORAL_TABLET | ORAL | Status: DC | PRN

## 2020-02-04 MED ORDER — LIDOCAINE HCL (PF) 1 % IJ SOLN
30.0000 mL | INTRAMUSCULAR | Status: AC | PRN
  Administered 2020-02-04: 30 mL via SUBCUTANEOUS

## 2020-02-04 MED ORDER — IBUPROFEN 600 MG PO TABS
600.0000 mg | ORAL_TABLET | Freq: Four times a day (QID) | ORAL | Status: DC
  Administered 2020-02-04 – 2020-02-05 (×5): 600 mg via ORAL
  Filled 2020-02-04 (×5): qty 1

## 2020-02-04 MED ORDER — SENNOSIDES-DOCUSATE SODIUM 8.6-50 MG PO TABS
2.0000 | ORAL_TABLET | ORAL | Status: DC
  Administered 2020-02-04: 2 via ORAL
  Filled 2020-02-04: qty 2

## 2020-02-04 MED ORDER — TERBUTALINE SULFATE 1 MG/ML IJ SOLN: 0.2500 mg | Freq: Once | INTRAMUSCULAR | Status: DC | PRN

## 2020-02-04 MED ORDER — ONDANSETRON HCL 4 MG/2ML IJ SOLN: 4.0000 mg | Freq: Four times a day (QID) | INTRAMUSCULAR | Status: DC | PRN

## 2020-02-04 MED ORDER — LACTATED RINGERS IV SOLN: INTRAVENOUS | Status: DC

## 2020-02-04 MED ORDER — OXYTOCIN 10 UNIT/ML IJ SOLN: 10.0000 [IU] | Freq: Once | INTRAMUSCULAR | Status: DC

## 2020-02-04 MED ORDER — DIBUCAINE (PERIANAL) 1 % EX OINT: 1.0000 "application " | TOPICAL_OINTMENT | CUTANEOUS | Status: DC | PRN

## 2020-02-04 MED ORDER — TETANUS-DIPHTH-ACELL PERTUSSIS 5-2.5-18.5 LF-MCG/0.5 IM SUSY: 0.5000 mL | PREFILLED_SYRINGE | Freq: Once | INTRAMUSCULAR | Status: DC

## 2020-02-04 MED ORDER — OXYTOCIN-SODIUM CHLORIDE 30-0.9 UT/500ML-% IV SOLN: 2.5000 [IU]/h | INTRAVENOUS | Status: DC

## 2020-02-04 MED ORDER — BENZOCAINE-MENTHOL 20-0.5 % EX AERO
1.0000 "application " | INHALATION_SPRAY | CUTANEOUS | Status: DC | PRN
  Administered 2020-02-05: 1 via TOPICAL
  Filled 2020-02-04: qty 56

## 2020-02-04 MED ORDER — PRENATAL MULTIVITAMIN CH
1.0000 | ORAL_TABLET | Freq: Every day | ORAL | Status: DC
  Administered 2020-02-05: 1 via ORAL
  Filled 2020-02-04: qty 1

## 2020-02-04 MED ORDER — MISOPROSTOL 50MCG HALF TABLET
50.0000 ug | ORAL_TABLET | Freq: Once | ORAL | Status: AC
  Administered 2020-02-04: 50 ug via BUCCAL

## 2020-02-04 MED ORDER — OXYTOCIN-SODIUM CHLORIDE 30-0.9 UT/500ML-% IV SOLN
1.0000 m[IU]/min | INTRAVENOUS | Status: DC
  Administered 2020-02-04: 2 m[IU]/min via INTRAVENOUS
  Filled 2020-02-04: qty 500

## 2020-02-04 MED ORDER — ACETAMINOPHEN 325 MG PO TABS
650.0000 mg | ORAL_TABLET | ORAL | Status: DC | PRN
  Administered 2020-02-04 – 2020-02-05 (×2): 650 mg via ORAL
  Filled 2020-02-04 (×2): qty 2

## 2020-02-04 MED ORDER — SIMETHICONE 80 MG PO CHEW: 80.0000 mg | CHEWABLE_TABLET | ORAL | Status: DC | PRN

## 2020-02-04 MED ORDER — OXYCODONE-ACETAMINOPHEN 5-325 MG PO TABS: 1.0000 | ORAL_TABLET | ORAL | Status: DC | PRN

## 2020-02-04 MED ORDER — ONDANSETRON HCL 4 MG PO TABS: 4.0000 mg | ORAL_TABLET | ORAL | Status: DC | PRN

## 2020-02-04 NOTE — Progress Notes (Signed)
S: Feeling some irregular contractions. Up and walking. Discussed the R/B/A of AROM for labor induction and patient consents to procedure.   O: Vitals:   02/04/20 1434 02/04/20 1458 02/04/20 1500 02/04/20 1524  BP: 118/67  (!) 116/59 109/64  Pulse: 78  77 72  Resp:  18  18  Temp:      TempSrc:      Weight:      Height:       FHT:  FHR: 120 bpm, variability: moderate,  accelerations:  Present,  decelerations:  Absent UC:   irregular, every 2-5 minutes SVE:   Dilation: 4 Effacement (%): 70 Station: -2 Exam by:: Lashona Vinita Prentiss, cnm AROM clear fluid at 1542   A / P: Induction of labor due to MCI and BMI >40, s/p 1 dose of buccal Cytotec, Pitocin infusing, AROM  Fetal Wellbeing:  Category I Pain Control:  Labor support without medications Anticipated MOD:  NSVD   Continue Pitocin titration until active labor is achieved.   June Leap, CNM, MSN 02/04/2020, 3:46 PM

## 2020-02-04 NOTE — H&P (Signed)
OB ADMISSION/ HISTORY & PHYSICAL:  Admission Date: 02/04/2020  7:38 AM  Admit Diagnosis: Encounter for induction of labor [Z34.90]    Anna Vasquez is a 34 y.o. female presenting for IOL at term due to marginal cord insertion. States she has been experiencing mild, irregular contractions since last night with accompanied diarrhea. Denies leaking of fluid or vaginal bleeding. Endorses + fetal movement. Husband, Danelle Earthly, present and supportive. Expecting baby boy, Nehemiah.   Prenatal History: G2P1001   EDC : 02/09/2020 Prenatal care at [facility] since 10 weeks, primary Carlean Jews, CNM  Prenatal course complicated by: 1. Obesity, BMI 43, history of gastric bypass 2. Marginal cord insertion, AGA growth 3. Hypothyroidism, on Synthroid 4. Rh negative, Rhogam given at 28 weeks 5. Pseudotumor cerebri, stable 6. Migraines, no current medicatons  Prenatal Labs: ABO, Rh: O (04/30 0000)  Antibody: PENDING (11/15 0820) Rubella: Immune (04/30 0000)  RPR: Nonreactive (04/30 0000)  HBsAg: Negative (04/30 0000)  HIV: Non-reactive (04/30 0000)  GBS: Negative/-- (04/30 0000)  1 hr Glucola : 90 Genetic Screening: Declines Ultrasound: normal XY anatomy, MCI, anterior placenta, last growth at 36 weeks 84%  TDAP: UTD FLU: Offered at discharge  COVID-19: ???    Maternal Diabetes: No Genetic Screening: Declined Maternal Ultrasounds/Referrals: Normal  Fetal Ultrasounds or other Referrals:  None Maternal Substance Abuse:  No Significant Maternal Medications:  Synthroid Significant Maternal Lab Results:  Group B Strep negative and Rh negative Other Comments:  None  Medical / Surgical History :  Past medical history:  Past Medical History:  Diagnosis Date  . Frequent headaches   . GERD (gastroesophageal reflux disease)   . H/O febrile seizure   . Hx of migraines   . Hypothyroidism   . Hypothyroidism   . PCOD (polycystic ovarian disease)   . Pseudotumor cerebri   . Thoracic outlet  syndrome    extra cervical ribs  . Vitamin D deficiency     Past surgical history:  Past Surgical History:  Procedure Laterality Date  . CARPAL TUNNEL RELEASE     rt and left  . LAPAROSCOPIC GASTRIC SLEEVE RESECTION    . MOUTH SURGERY  1998    Family History:  Family History  Problem Relation Age of Onset  . Arthritis Father   . Hypertension Father   . Sleep apnea Father   . Transient ischemic attack Father   . COPD Father   . Rheum arthritis Mother   . Breast cancer Paternal Aunt   . Mental illness Paternal Uncle   . Esophageal cancer Paternal Uncle   . Prostate cancer Paternal Grandfather   . Alzheimer's disease Maternal Grandmother   . Skin cancer Paternal Grandmother     Social History:  reports that she has never smoked. She has never used smokeless tobacco. She reports current alcohol use. She reports that she does not use drugs.  Allergies: Tetracyclines & related   Current Medications at time of admission:  Medications Prior to Admission  Medication Sig Dispense Refill Last Dose  . SYNTHROID 112 MCG tablet Take 1 tablet (112 mcg total) by mouth daily. 90 tablet 0     Review of Systems: Review of Systems  All other systems reviewed and are negative.  Physical Exam: Vital signs and nursing notes reviewed.  Patient Vitals for the past 24 hrs:  BP Temp Temp src Pulse Resp Height Weight  02/04/20 0818 -- -- -- -- -- 5\' 5"  (1.651 m) 127.3 kg  02/04/20 0814 107/65 97.8 F (  36.6 C) Oral 100 18 -- --    General: AAO x 3, NAD Heart: RRR Lungs:CTAB Abdomen: Gravid, NT, Leopold's vertex, 8-8.5lbs Extremities: 1+ non-pitting edema Genitalia / VE: Dilation: 3 Effacement (%): 60 Station: -2 Presentation: Vertex Exam by:: Ines Marieke Lubke, cnm   FHR: 140BPM, mod variability, + accels, no decels TOCO: Contractions q 3-7  Labs:   Pending T&S, CBC, RPR  Recent Labs    02/04/20 0823  WBC 13.0*  HGB 12.1  HCT 36.9  PLT 257   Assessment:  34 y.o. G2P1001 at  [redacted]w[redacted]d, MCI, BMI 43  1. Induction of labor for MCI and BMI 43 2. FHR category 1 3. GBS negative 4. Desires unmedicated birth 5. Plans to breastfeed 6. Desires inpatient circumcision 7. Hypothyroidism, on Synthroid  8. Rh negative, Rhogam at 28 weeks 9. Pseudotumor cerebri, stable  Plan:  1. Admit to [facility] 2. Routine L&D orders 3. Analgesia/anesthesia PRN  4. Cytotec buccal every 4 hours until active labor, will consider AROM in active labor 5. Anticipate NSVB  Dr. Ernestina Penna notified of admission/plan of care.  June Leap CNM, MSN 02/04/2020, 8:48 AM

## 2020-02-04 NOTE — Progress Notes (Signed)
S: Feeling some irregular contractions. Husband, Anna Vasquez, at the bedside providing support.   O: Vitals:   02/04/20 0818 02/04/20 0930 02/04/20 1229 02/04/20 1231  BP:  118/67  (!) 109/55  Pulse:  80  74  Resp:  18 18   Temp:   98.6 F (37 C)   TempSrc:   Oral   Weight: 127.3 kg     Height: 5\' 5"  (1.651 m)      FHT:  FHR: 125 bpm, variability: moderate,  accelerations:  Present,  decelerations:  Absent UC:   irregular, every 1-6 minutes SVE:   Dilation: 3 Effacement (%): 60 Station: -2 Exam by:: Tonnya Jacquline Terrill, cnm  A / P: Induction of labor due to MCI and BMI >40, s/p 1 dose of buccal Cytotec  Fetal Wellbeing:  Category I Pain Control:  Labor support without medications Anticipated MOD:  NSVD   Will start Pitocin 2x2 until adequate contractions then consider AROM.   [phone], CNM, MSN 02/04/2020, 12:36 PM

## 2020-02-04 NOTE — Lactation Note (Signed)
This note was copied from a baby's chart. Lactation Consultation Note  Patient Name: Boy Brexlee Heberlein [id number]'N Date: 02/04/2020 Reason for consult: Initial assessment;Mother's request;Term;Other (Comment);Maternal endocrine disorder Type of Endocrine Disorder?: PCOS (Mom on Levothyroxine and hx of gastric bypass)  Infant is 39 weeks 4 hours old. Mom experiencing with breastfeeding, breast fed last child for 13 months.  Feedings going well as per Mom with good latch and signs of milk transfer. Last feeding went for 30 minutes at 18:30. Mom latching infant at the time of the visit. LC able to assist putting infant in football position and getting the lips flanged at the breast. Mom stated she had less pain once adjustments were made, infant nursed with signs of milk transfer for 15 minutes.   Mom's nipples are both erect no signs of abrasion or skin breakdown. LC showed Mom how to express breast milk onto to nipples for care.   Plan 1. To feed based on cues 8-12 x in 24 hour period no more than 4 hours without an attempt. Mom to place infant STS and look for signs of milk transfer.         2. I's and O's sheet reviewed with parents.          3. LC brochure of inpatient and outpatient services reviewed.          4. All questions answered at the end of the visit.   Mom has Spectra pump at home.    Sharlon Pfohl  Nicholson-Springer 02/04/2020, 11:00 PM

## 2020-02-05 DIAGNOSIS — O26899 Other specified pregnancy related conditions, unspecified trimester: Secondary | ICD-10-CM

## 2020-02-05 LAB — CBC
HCT: 33.3 % — ABNORMAL LOW (ref 36.0–46.0)
Hemoglobin: 10.7 g/dL — ABNORMAL LOW (ref 12.0–15.0)
MCH: 29.6 pg (ref 26.0–34.0)
MCHC: 32.1 g/dL (ref 30.0–36.0)
MCV: 92 fL (ref 80.0–100.0)
Platelets: 223 10*3/uL (ref 150–400)
RBC: 3.62 MIL/uL — ABNORMAL LOW (ref 3.87–5.11)
RDW: 13.8 % (ref 11.5–15.5)
WBC: 17.3 10*3/uL — ABNORMAL HIGH (ref 4.0–10.5)
nRBC: 0 % (ref 0.0–0.2)

## 2020-02-05 MED ORDER — ACETAMINOPHEN 500 MG PO TABS: 1000.0000 mg | ORAL_TABLET | Freq: Four times a day (QID) | ORAL | 2 refills | Status: DC | PRN

## 2020-02-05 MED ORDER — PRENATAL MULTIVITAMIN CH
1.0000 | ORAL_TABLET | Freq: Every day | ORAL | Status: DC
Start: 2020-02-05 — End: 2021-03-02

## 2020-02-05 MED ORDER — BENZOCAINE-MENTHOL 20-0.5 % EX AERO: 1.0000 "application " | INHALATION_SPRAY | CUTANEOUS | Status: DC | PRN

## 2020-02-05 MED ORDER — IBUPROFEN 600 MG PO TABS
600.0000 mg | ORAL_TABLET | Freq: Four times a day (QID) | ORAL | 0 refills | Status: DC
Start: 2020-02-05 — End: 2020-02-28

## 2020-02-05 MED ORDER — INFLUENZA VAC SPLIT QUAD 0.5 ML IM SUSY
0.5000 mL | PREFILLED_SYRINGE | Freq: Once | INTRAMUSCULAR | Status: AC
  Administered 2020-02-05: 0.5 mL via INTRAMUSCULAR
  Filled 2020-02-05: qty 0.5

## 2020-02-05 MED ORDER — COCONUT OIL OIL
1.0000 | TOPICAL_OIL | 0 refills | Status: DC | PRN
Start: 2020-02-05 — End: 2020-02-28

## 2020-02-05 NOTE — Discharge Instructions (Signed)
Lactation outpatient support - home visit ° °Linda Coppola °RN, MHA, IBCLC °at [facility]: Lactation Consult[facility] ° °[email or web address] ° ° ° °Additional resources: ° °[facility] °https://[facility].ca/information-sheets/ ° ° °Chiropractic specialist  ° °Dr. Leanna Hastings °[email or web address] ° °Craniosacral therapy for baby ° °Erin Balkind  °[email or web address] ° °

## 2020-02-05 NOTE — Discharge Summary (Signed)
OB Discharge Summary  Patient Name: Anna Vasquez DOB: 1985-09-30 MRN: [id number]  Date of admission: 02/04/2020 Delivering provider: Dorisann Frames K   Admitting diagnosis: Encounter for induction of labor [Z34.90] Indication for care in labor or delivery [O75.9] Intrauterine pregnancy: [redacted]w[redacted]d     Secondary diagnosis: Patient Active Problem List   Diagnosis Date Noted  . Rh negative, maternal / newborn Rh negative 02/05/2020  . Encounter for induction of labor [facility]CI 02/04/2020  . Indication for care in labor or delivery 02/04/2020  . SVD (spontaneous vaginal delivery) 11/15 02/04/2020  . Postpartum care following vaginal delivery 11/15 02/04/2020  . BMI 45.0-49.9, adult (HCC) 02/04/2020  . Pseudotumor cerebri 09/30/2012  . Hypothyroidism 09/30/2012  . PCOD (polycystic ovarian disease) 09/30/2012   Additional problems:none   Date of discharge: 02/05/2020   Discharge diagnosis: Principal Problem:   Postpartum care following vaginal delivery 11/15 Active Problems:   Pseudotumor cerebri   Hypothyroidism   Encounter for induction of labor MCI   Indication for care in labor or delivery   SVD (spontaneous vaginal delivery) 11/15   BMI 45.0-49.9, adult (HCC)   Rh negative, maternal / newborn Rh negative                                                              Post partum procedures:none  Augmentation: AROM, Pitocin and Cytotec Pain control: None  Laceration:1st degree  Episiotomy:None  Complications: None  Hospital course:  Induction of Labor With Vaginal Delivery   34 y.o. yo G2P2002 at [redacted]w[redacted]d was admitted to the hospital 02/04/2020 for induction of labor.  Indication for induction: BMI 43.  Patient had an uncomplicated labor course as follows: Membrane Rupture Time/Date: 3:42 PM ,02/04/2020   Delivery Method:Vaginal, Spontaneous  Episiotomy: None  Lacerations:  1st degree  Details of delivery can be found in separate delivery note.  Patient had a routine postpartum  course. Patient is discharged home 02/05/20.  Newborn Data: Birth date:02/04/2020  Birth time:6:09 PM  Gender:Female  Living status:Living  Apgars:8 ,9  Weight:4156 g   Physical exam  Vitals:   02/04/20 2015 02/04/20 2115 02/05/20 0115 02/05/20 0600  BP: 108/64 126/62 (!) 98/58 (!) 107/55  Pulse: 72 73 65 64  Resp: 16 16 16 18   Temp: 97.9 F (36.6 C) 97.9 F (36.6 C) 98 F (36.7 C) 97.8 F (36.6 C)  TempSrc: Oral Oral Oral Oral  SpO2: 99% 99% 100% 100%  Weight:      Height:       General: alert, cooperative and no distress Lochia: appropriate Uterine Fundus: firm Incision: N/A Perineum: repair intact, no edema DVT Evaluation: No significant calf/ankle edema. Labs: Lab Results  Component Value Date   WBC 17.3 (H) 02/05/2020   HGB 10.7 (L) 02/05/2020   HCT 33.3 (L) 02/05/2020   MCV 92.0 02/05/2020   PLT 223 02/05/2020   CMP Latest Ref Rng & Units 02/07/2017  Glucose 70 - 99 mg/dL 88  BUN 6 - 23 mg/dL 12  Creatinine 02/09/2017 - 6.28 mg/dL 3.15  Sodium 1.76 - 160 mEq/L 138  Potassium 3.5 - 5.1 mEq/L 4.4  Chloride 96 - 112 mEq/L 105  CO2 19 - 32 mEq/L 28  Calcium 8.4 - 10.5 mg/dL 9.2  Total Protein 6.0 - 8.3 g/dL 6.9  Total Bilirubin 0.2 - 1.2 mg/dL 1.1  Alkaline Phos 39 - 117 U/L 44  AST 0 - 37 U/L 21  ALT 0 - 35 U/L 18   Edinburgh Postnatal Depression Scale Screening Tool 01/27/2018 01/26/2018  I have been able to laugh and see the funny side of things. 0 (No Data)  I have looked forward with enjoyment to things. 0 -  I have blamed myself unnecessarily when things went wrong. 1 -  I have been anxious or worried for no good reason. 2 -  I have felt scared or panicky for no good reason. 1 -  Things have been getting on top of me. 0 -  I have been so unhappy that I have had difficulty sleeping. 0 -  I have felt sad or miserable. 0 -  I have been so unhappy that I have been crying. 0 -  The thought of harming myself has occurred to me. 0 -  Edinburgh Postnatal  Depression Scale Total 4 -   Vaccines: TDaP          UTD         Flu             Offered at DC                    COVID-19 declined  Discharge instruction:  per After Visit Summary,  [facility] booklet and  "Understanding Mother & Baby Care" hospital booklet  After Visit Meds:  Allergies as of 02/05/2020      Reactions   Tetracyclines & Related Other (See Comments)   Has had a Pseudotumor cerebri, was advised to list this medication as contraindicated       Medication List    TAKE these medications   acetaminophen 500 MG tablet Commonly known as: TYLENOL Take 2 tablets (1,000 mg total) by mouth every 6 (six) hours as needed.   benzocaine-Menthol 20-0.5 % Aero Commonly known as: DERMOPLAST Apply 1 application topically as needed for irritation (perineal discomfort).   coconut oil Oil Apply 1 application topically as needed.   ibuprofen 600 MG tablet Commonly known as: ADVIL Take 1 tablet (600 mg total) by mouth every 6 (six) hours.   prenatal multivitamin Tabs tablet Take 1 tablet by mouth daily at 12 noon.   Synthroid 112 MCG tablet Generic drug: levothyroxine Take 1 tablet (112 mcg total) by mouth daily.            Discharge Care Instructions  (From admission, onward)         Start     Ordered   02/05/20 0000  Discharge wound care:       Comments: Sitz baths 2 times /day with warm water x 1 week. May add herbals: 1 ounce dried comfrey leaf* 1 ounce calendula flowers 1 ounce lavender flowers  Supplies can be found online at [employer] sources at [employer], [employer]  1/2 ounce dried uva ursi leaves 1/2 ounce witch hazel blossoms (if you can find them) 1/2 ounce dried sage leaf 1/2 cup sea salt Directions: Bring 2 quarts of water to a boil. Turn off heat, and place 1 ounce (approximately 1 large handful) of the above mixed herbs (not the salt) into the pot. Steep, covered, for 30 minutes.  Strain the liquid well with a fine mesh  strainer, and discard the herb material. Add 2 quarts of liquid to the tub, along with the 1/2 cup of salt. This  medicinal liquid can also be made into compresses and peri-rinses.   02/05/20 0959          Diet: routine diet  Activity: Advance as tolerated. Pelvic rest for 6 weeks.   Postpartum contraception: Not Discussed  Newborn Data: Live born female  Birth Weight: 9 lb 2.6 oz (4156 g) APGAR: 8, 9  Newborn Delivery   Birth date/time: 02/04/2020 18:09:00 Delivery type: Vaginal, Spontaneous      named Nehemiah Baby Feeding: Breast Disposition:home with mother   Delivery Report:  Review the Delivery Report for details.    Follow up:  Follow-up Information    Karena Addison, CNM. Go in 6 week(s).   Specialty: Obstetrics and Gynecology Contact information: [address] [place] [address] [phone]                 Signed: Cipriano Mile, MSN 02/05/2020, 10:00 AM

## 2020-02-05 NOTE — Lactation Note (Addendum)
This note was copied from a baby's chart. Lactation Consultation Note  Patient Name: Boy Amarra Sawyer Today's Date: 02/05/2020  Parents report they are waiting on Bili results.  Mom repots she is d/c.Parents reports they were supposed to be back at 6pm  and haven't heard anything.  Discussed vitamin and mineral supplementation with gi bypass.  Mom reports she doubles up on her prenatal vitamins but doesn't take anymore GI specific vitamin and mineral supplements.  Mom reprots she doesn't follow up with the gi doctor anymore.  Bili slightly elevated and trending upward. Discussed with mom adding some hand expression and spoon feeding past breastfeeding.  Discussed possibly adding pumping as well . Especially if parents stayed due to jaundice.  Parents report there last baby was jaundiced as well but did not require phot therapy. Parents report they feel he had been breastfeeding well. Parents report circumcised today.  Urged to call lactation as needed. Brought mom some spoons to start adding hand expression to breastfeeding.    Maternal Data    Feeding Feeding Type: Breast Fed  [facility] Score                   Interventions    Lactation Tools Discussed/Used     Consult Status      Rolfe Hartsell Michaelle Copas 02/05/2020, 10:19 PM

## 2020-02-06 ENCOUNTER — Ambulatory Visit: Payer: Self-pay

## 2020-02-06 NOTE — Lactation Note (Signed)
This note was copied from a baby's chart. Lactation Consultation Note  Patient Name: Anna Vasquez [id number] Date: 02/06/2020 Reason for consult: Follow-up assessment   P2 mother whose infant is now 32 hours old.  This is a term baby at 39+2 weeks.  Mother breast fed her first child for one year.  Family is ready for discharge.  Bilirubin level being monitored and is 8.9 mg/dl at 36 hours of life.  Thsi is in the low intermediate risk zone.  Feeding plan in place for after discharge.  Engorgement prevention/treatment reviewed.  Provided a manual pump and checked flange size.  Gave mother a #30 flange size for a better fit.  Mother has a DEBP for home use.  She has our OP phone number for any concerns after discharge.  Father present.   Maternal Data    Feeding Feeding Type: Breast Fed  LATCH Score                   Interventions    Lactation Tools Discussed/Used     Consult Status Consult Status: Complete Date: 02/06/20 Follow-up type: Call as needed    Anna Vasquez 02/06/2020, 8:34 AM

## 2020-02-28 ENCOUNTER — Other Ambulatory Visit: Payer: Self-pay

## 2020-02-28 ENCOUNTER — Other Ambulatory Visit: Payer: Self-pay | Admitting: Internal Medicine

## 2020-02-28 ENCOUNTER — Encounter: Payer: Self-pay | Admitting: Internal Medicine

## 2020-02-28 ENCOUNTER — Ambulatory Visit (INDEPENDENT_AMBULATORY_CARE_PROVIDER_SITE_OTHER): Payer: BC Managed Care – PPO | Admitting: Internal Medicine

## 2020-02-28 VITALS — BP 110/82 | HR 73 | Temp 98.2°F | Ht 65.0 in | Wt 265.2 lb

## 2020-02-28 DIAGNOSIS — K219 Gastro-esophageal reflux disease without esophagitis: Secondary | ICD-10-CM

## 2020-02-28 DIAGNOSIS — D649 Anemia, unspecified: Secondary | ICD-10-CM

## 2020-02-28 DIAGNOSIS — E039 Hypothyroidism, unspecified: Secondary | ICD-10-CM

## 2020-02-28 LAB — CBC WITH DIFFERENTIAL/PLATELET
Basophils Absolute: 0.1 10*3/uL (ref 0.0–0.1)
Basophils Relative: 1 % (ref 0.0–3.0)
Eosinophils Absolute: 0.7 10*3/uL (ref 0.0–0.7)
Eosinophils Relative: 9.4 % — ABNORMAL HIGH (ref 0.0–5.0)
HCT: 39.3 % (ref 36.0–46.0)
Hemoglobin: 12.7 g/dL (ref 12.0–15.0)
Lymphocytes Relative: 25.9 % (ref 12.0–46.0)
Lymphs Abs: 1.9 10*3/uL (ref 0.7–4.0)
MCHC: 32.3 g/dL (ref 30.0–36.0)
MCV: 90.9 fl (ref 78.0–100.0)
Monocytes Absolute: 0.4 10*3/uL (ref 0.1–1.0)
Monocytes Relative: 5.5 % (ref 3.0–12.0)
Neutro Abs: 4.2 10*3/uL (ref 1.4–7.7)
Neutrophils Relative %: 58.2 % (ref 43.0–77.0)
Platelets: 303 10*3/uL (ref 150.0–400.0)
RBC: 4.33 Mil/uL (ref 3.87–5.11)
RDW: 14.1 % (ref 11.5–15.5)
WBC: 7.2 10*3/uL (ref 4.0–10.5)

## 2020-02-28 LAB — TSH: TSH: 0.08 u[IU]/mL — ABNORMAL LOW (ref 0.35–4.50)

## 2020-02-28 NOTE — Assessment & Plan Note (Signed)
On synthroid q day.  Recent pregnancy.  Recheck tsh today.

## 2020-02-28 NOTE — Assessment & Plan Note (Signed)
Acid reflux as outlined.  Restarted protonix.  Follow symptoms.  Update me over the next few weeks.

## 2020-02-28 NOTE — Assessment & Plan Note (Signed)
Doing well s/p pregnancy/birth.

## 2020-02-28 NOTE — Progress Notes (Signed)
Order placed for f/u tsh.  

## 2020-02-28 NOTE — Assessment & Plan Note (Signed)
Noted during pregnancy.  Recheck cbc today.

## 2020-02-28 NOTE — Progress Notes (Signed)
Patient ID: Anna Vasquez, female   DOB: December 04, 1985, 34 y.o.   MRN: [id number]   Subjective:    Patient ID: Anna Vasquez, female    DOB: 09-13-1985, 34 y.o.   MRN: [id number]  HPI This visit occurred during the SARS-CoV-2 public health emergency.  Safety protocols were in place, including screening questions prior to the visit, additional usage of staff PPE, and extensive cleaning of exam room while observing appropriate contact time as indicated for disinfecting solutions.  Patient here for a scheduled physical, but she gets her breast, pelvic exams and pap smears through OB.  Changed visit to follow up visit. Recently gave birth.  She is doing well.  Is breast feeding.  Tries to stay active.  No chest pain or sob reported.  Had problems with acid reflux through pregnancy.  Took protonix.  Stopped after gave birth.  Last night, started having more acid reflux.  Restarted protonix.  No abdominal pain.  Bowels moving.    Past Medical History:  Diagnosis Date  . Frequent headaches   . GERD (gastroesophageal reflux disease)   . H/O febrile seizure   . Hx of migraines   . Hypothyroidism   . Hypothyroidism   . PCOD (polycystic ovarian disease)   . Pseudotumor cerebri   . Thoracic outlet syndrome    extra cervical ribs  . Vitamin D deficiency    Past Surgical History:  Procedure Laterality Date  . CARPAL TUNNEL RELEASE     rt and left  . LAPAROSCOPIC GASTRIC SLEEVE RESECTION    . MOUTH SURGERY  1998   Family History  Problem Relation Age of Onset  . Arthritis Father   . Hypertension Father   . Sleep apnea Father   . Transient ischemic attack Father   . COPD Father   . Rheum arthritis Mother   . Breast cancer Paternal Aunt   . Mental illness Paternal Uncle   . Esophageal cancer Paternal Uncle   . Prostate cancer Paternal Grandfather   . Alzheimer's disease Maternal Grandmother   . Skin cancer Paternal Grandmother    Social History   Socioeconomic History  . Marital  status: Married    Spouse name: Not on file  . Number of children: Not on file  . Years of education: Not on file  . Highest education level: Not on file  Occupational History  . Not on file  Tobacco Use  . Smoking status: Never Smoker  . Smokeless tobacco: Never Used  Vaping Use  . Vaping Use: Never used  Substance and Sexual Activity  . Alcohol use: Yes    Alcohol/week: 0.0 standard drinks  . Drug use: No  . Sexual activity: Yes  Other Topics Concern  . Not on file  Social History Narrative  . Not on file   Social Determinants of Health   Financial Resource Strain: Not on file  Food Insecurity: Not on file  Transportation Needs: Not on file  Physical Activity: Not on file  Stress: Not on file  Social Connections: Not on file    Outpatient Encounter Medications as of 02/28/2020  Medication Sig  . pantoprazole (PROTONIX) 40 MG tablet Take 40 mg by mouth daily.  . Prenatal Vit-Fe Fumarate-FA (PRENATAL MULTIVITAMIN) TABS tablet Take 1 tablet by mouth daily at 12 noon.  Marland Kitchen SYNTHROID 125 MCG tablet Take 125 mcg by mouth daily.  . [DISCONTINUED] SYNTHROID 112 MCG tablet Take 1 tablet (112 mcg total) by mouth daily.  . [DISCONTINUED] acetaminophen (  TYLENOL) 500 MG tablet Take 2 tablets (1,000 mg total) by mouth every 6 (six) hours as needed.  . [DISCONTINUED] benzocaine-Menthol (DERMOPLAST) 20-0.5 % AERO Apply 1 application topically as needed for irritation (perineal discomfort).  . [DISCONTINUED] coconut oil OIL Apply 1 application topically as needed.  . [DISCONTINUED] ibuprofen (ADVIL) 600 MG tablet Take 1 tablet (600 mg total) by mouth every 6 (six) hours.   No facility-administered encounter medications on file as of 02/28/2020.    Review of Systems  Constitutional: Negative for appetite change and unexpected weight change.  HENT: Negative for congestion, sinus pressure and sore throat.   Eyes: Negative for pain and visual disturbance.  Respiratory: Negative for cough,  chest tightness and shortness of breath.   Cardiovascular: Negative for chest pain, palpitations and leg swelling.  Gastrointestinal: Negative for abdominal pain, diarrhea, nausea and vomiting.  Genitourinary: Negative for difficulty urinating and dysuria.  Musculoskeletal: Negative for joint swelling and myalgias.  Skin: Negative for color change and rash.  Neurological: Negative for dizziness, light-headedness and headaches.  Hematological: Negative for adenopathy. Does not bruise/bleed easily.  Psychiatric/Behavioral: Negative for decreased concentration and dysphoric mood.       Objective:    Physical Exam Vitals reviewed.  Constitutional:      General: She is not in acute distress.    Appearance: Normal appearance.  HENT:     Head: Normocephalic and atraumatic.     Right Ear: External ear normal.     Left Ear: External ear normal.     Mouth/Throat:     Mouth: Oropharynx is clear and moist.  Eyes:     General: No scleral icterus.       Right eye: No discharge.        Left eye: No discharge.     Conjunctiva/sclera: Conjunctivae normal.  Neck:     Thyroid: No thyromegaly.  Cardiovascular:     Rate and Rhythm: Normal rate and regular rhythm.  Pulmonary:     Effort: No respiratory distress.     Breath sounds: Normal breath sounds. No wheezing.  Abdominal:     General: Bowel sounds are normal.     Palpations: Abdomen is soft.     Tenderness: There is no abdominal tenderness.  Musculoskeletal:        General: No swelling, tenderness or edema.     Cervical back: Neck supple. No tenderness.  Lymphadenopathy:     Cervical: No cervical adenopathy.  Skin:    Findings: No erythema or rash.  Neurological:     Mental Status: She is alert.  Psychiatric:        Mood and Affect: Mood normal.        Behavior: Behavior normal.     BP 110/82 (BP Location: Left Arm, Patient Position: Sitting)   Pulse 73   Temp 98.2 F (36.8 C)   Ht 5\' 5"  (1.651 m)   Wt 265 lb 3.2 oz (120.3  kg)   SpO2 99%   BMI 44.13 kg/m  Wt Readings from Last 3 Encounters:  02/28/20 265 lb 3.2 oz (120.3 kg)  02/04/20 280 lb 9.6 oz (127.3 kg)  02/23/19 257 lb (116.6 kg)     Lab Results  Component Value Date   WBC 7.2 02/28/2020   HGB 12.7 02/28/2020   HCT 39.3 02/28/2020   PLT 303.0 02/28/2020   GLUCOSE 88 02/07/2017   CHOL 184 03/07/2015   TRIG 71.0 03/07/2015   HDL 63.60 03/07/2015   LDLCALC 106 (H) 03/07/2015  ALT 18 02/07/2017   AST 21 02/07/2017   NA 138 02/07/2017   K 4.4 02/07/2017   CL 105 02/07/2017   CREATININE 0.77 02/07/2017   BUN 12 02/07/2017   CO2 28 02/07/2017   TSH 0.08 (L) 02/28/2020   INR 1.0 08/25/2012   HGBA1C 5.2 08/25/2012       Assessment & Plan:   Problem List Items Addressed This Visit    SVD (spontaneous vaginal delivery) 11/15    Doing well s/p pregnancy/birth.        Hypothyroidism    On synthroid q day.  Recent pregnancy.  Recheck tsh today.        Relevant Medications   SYNTHROID 125 MCG tablet   Other Relevant Orders   TSH (Completed)   GERD (gastroesophageal reflux disease)    Acid reflux as outlined.  Restarted protonix.  Follow symptoms.  Update me over the next few weeks.        Relevant Medications   pantoprazole (PROTONIX) 40 MG tablet   Anemia - Primary    Noted during pregnancy.  Recheck cbc today.        Relevant Orders   CBC with Differential/Platelet (Completed)       Dale Lily, MD

## 2020-02-29 ENCOUNTER — Other Ambulatory Visit: Payer: Self-pay

## 2020-02-29 MED ORDER — SYNTHROID 112 MCG PO TABS: 112.0000 ug | ORAL_TABLET | Freq: Every day | ORAL | 1 refills | Status: DC

## 2020-04-09 ENCOUNTER — Telehealth: Payer: Self-pay

## 2020-04-09 DIAGNOSIS — E039 Hypothyroidism, unspecified: Secondary | ICD-10-CM | POA: Diagnosis not present

## 2020-04-09 NOTE — Addendum Note (Signed)
Addended by: Larry Sierras on: 04/09/2020 02:14 PM   Modules accepted: Orders

## 2020-04-09 NOTE — Telephone Encounter (Signed)
Order for TSH changed to [facility]

## 2020-04-10 ENCOUNTER — Other Ambulatory Visit: Payer: Self-pay

## 2020-04-10 DIAGNOSIS — E039 Hypothyroidism, unspecified: Secondary | ICD-10-CM

## 2020-04-10 LAB — TSH: TSH: 0.84 u[IU]/mL (ref 0.450–4.500)

## 2020-05-05 DIAGNOSIS — F4323 Adjustment disorder with mixed anxiety and depressed mood: Secondary | ICD-10-CM | POA: Diagnosis not present

## 2020-05-12 DIAGNOSIS — F4323 Adjustment disorder with mixed anxiety and depressed mood: Secondary | ICD-10-CM | POA: Diagnosis not present

## 2020-05-20 DIAGNOSIS — F4323 Adjustment disorder with mixed anxiety and depressed mood: Secondary | ICD-10-CM | POA: Diagnosis not present

## 2020-05-26 DIAGNOSIS — F4323 Adjustment disorder with mixed anxiety and depressed mood: Secondary | ICD-10-CM | POA: Diagnosis not present

## 2020-06-03 DIAGNOSIS — F4323 Adjustment disorder with mixed anxiety and depressed mood: Secondary | ICD-10-CM | POA: Diagnosis not present

## 2020-06-10 DIAGNOSIS — F4323 Adjustment disorder with mixed anxiety and depressed mood: Secondary | ICD-10-CM | POA: Diagnosis not present

## 2020-06-17 DIAGNOSIS — F4323 Adjustment disorder with mixed anxiety and depressed mood: Secondary | ICD-10-CM | POA: Diagnosis not present

## 2020-06-24 DIAGNOSIS — F4323 Adjustment disorder with mixed anxiety and depressed mood: Secondary | ICD-10-CM | POA: Diagnosis not present

## 2020-06-27 DIAGNOSIS — F4323 Adjustment disorder with mixed anxiety and depressed mood: Secondary | ICD-10-CM | POA: Diagnosis not present

## 2020-07-01 DIAGNOSIS — F4323 Adjustment disorder with mixed anxiety and depressed mood: Secondary | ICD-10-CM | POA: Diagnosis not present

## 2020-07-03 ENCOUNTER — Other Ambulatory Visit: Payer: Self-pay | Admitting: Internal Medicine

## 2020-07-08 ENCOUNTER — Telehealth: Payer: Self-pay | Admitting: Internal Medicine

## 2020-07-08 DIAGNOSIS — F4323 Adjustment disorder with mixed anxiety and depressed mood: Secondary | ICD-10-CM | POA: Diagnosis not present

## 2020-07-08 NOTE — Telephone Encounter (Signed)
Patient called in wants her labs done at the [facility] in [place]

## 2020-07-09 NOTE — Telephone Encounter (Signed)
There is a tsh lab order in the system.  I assume this will carry over to [facility]. Please confirm.  Can reorder or fax order if needed.

## 2020-07-09 NOTE — Telephone Encounter (Signed)
Labs are already ordered for [facility]. Pt is going there to have done

## 2020-07-10 DIAGNOSIS — E039 Hypothyroidism, unspecified: Secondary | ICD-10-CM | POA: Diagnosis not present

## 2020-07-11 LAB — TSH: TSH: 1.66 u[IU]/mL (ref 0.450–4.500)

## 2020-07-22 DIAGNOSIS — F4323 Adjustment disorder with mixed anxiety and depressed mood: Secondary | ICD-10-CM | POA: Diagnosis not present

## 2020-07-29 DIAGNOSIS — F4323 Adjustment disorder with mixed anxiety and depressed mood: Secondary | ICD-10-CM | POA: Diagnosis not present

## 2020-08-04 ENCOUNTER — Other Ambulatory Visit: Payer: Self-pay | Admitting: Internal Medicine

## 2020-08-05 DIAGNOSIS — F4323 Adjustment disorder with mixed anxiety and depressed mood: Secondary | ICD-10-CM | POA: Diagnosis not present

## 2020-08-07 DIAGNOSIS — F4310 Post-traumatic stress disorder, unspecified: Secondary | ICD-10-CM | POA: Diagnosis not present

## 2020-08-14 DIAGNOSIS — F4310 Post-traumatic stress disorder, unspecified: Secondary | ICD-10-CM | POA: Diagnosis not present

## 2020-08-19 DIAGNOSIS — F4323 Adjustment disorder with mixed anxiety and depressed mood: Secondary | ICD-10-CM | POA: Diagnosis not present

## 2020-08-21 DIAGNOSIS — F4310 Post-traumatic stress disorder, unspecified: Secondary | ICD-10-CM | POA: Diagnosis not present

## 2020-08-28 ENCOUNTER — Ambulatory Visit: Payer: BC Managed Care – PPO | Admitting: Internal Medicine

## 2020-08-28 DIAGNOSIS — F4310 Post-traumatic stress disorder, unspecified: Secondary | ICD-10-CM | POA: Diagnosis not present

## 2020-09-02 ENCOUNTER — Other Ambulatory Visit: Payer: Self-pay | Admitting: Internal Medicine

## 2020-09-02 DIAGNOSIS — F4323 Adjustment disorder with mixed anxiety and depressed mood: Secondary | ICD-10-CM | POA: Diagnosis not present

## 2020-09-04 DIAGNOSIS — F4310 Post-traumatic stress disorder, unspecified: Secondary | ICD-10-CM | POA: Diagnosis not present

## 2020-09-12 DIAGNOSIS — Z20822 Contact with and (suspected) exposure to covid-19: Secondary | ICD-10-CM | POA: Diagnosis not present

## 2020-09-12 DIAGNOSIS — J029 Acute pharyngitis, unspecified: Secondary | ICD-10-CM | POA: Diagnosis not present

## 2020-09-16 DIAGNOSIS — F4323 Adjustment disorder with mixed anxiety and depressed mood: Secondary | ICD-10-CM | POA: Diagnosis not present

## 2020-09-18 DIAGNOSIS — F4310 Post-traumatic stress disorder, unspecified: Secondary | ICD-10-CM | POA: Diagnosis not present

## 2020-09-25 DIAGNOSIS — F4310 Post-traumatic stress disorder, unspecified: Secondary | ICD-10-CM | POA: Diagnosis not present

## 2020-10-02 DIAGNOSIS — F4310 Post-traumatic stress disorder, unspecified: Secondary | ICD-10-CM | POA: Diagnosis not present

## 2020-10-06 ENCOUNTER — Other Ambulatory Visit: Payer: Self-pay | Admitting: Internal Medicine

## 2020-10-09 DIAGNOSIS — F4310 Post-traumatic stress disorder, unspecified: Secondary | ICD-10-CM | POA: Diagnosis not present

## 2020-10-14 DIAGNOSIS — F4323 Adjustment disorder with mixed anxiety and depressed mood: Secondary | ICD-10-CM | POA: Diagnosis not present

## 2020-10-23 DIAGNOSIS — F4310 Post-traumatic stress disorder, unspecified: Secondary | ICD-10-CM | POA: Diagnosis not present

## 2020-10-30 DIAGNOSIS — F4310 Post-traumatic stress disorder, unspecified: Secondary | ICD-10-CM | POA: Diagnosis not present

## 2020-11-06 DIAGNOSIS — F4310 Post-traumatic stress disorder, unspecified: Secondary | ICD-10-CM | POA: Diagnosis not present

## 2020-11-08 ENCOUNTER — Other Ambulatory Visit: Payer: Self-pay | Admitting: Internal Medicine

## 2020-11-13 DIAGNOSIS — F4310 Post-traumatic stress disorder, unspecified: Secondary | ICD-10-CM | POA: Diagnosis not present

## 2020-11-18 DIAGNOSIS — F4323 Adjustment disorder with mixed anxiety and depressed mood: Secondary | ICD-10-CM | POA: Diagnosis not present

## 2020-11-20 DIAGNOSIS — F4310 Post-traumatic stress disorder, unspecified: Secondary | ICD-10-CM | POA: Diagnosis not present

## 2020-11-27 DIAGNOSIS — F4310 Post-traumatic stress disorder, unspecified: Secondary | ICD-10-CM | POA: Diagnosis not present

## 2020-12-01 DIAGNOSIS — F4310 Post-traumatic stress disorder, unspecified: Secondary | ICD-10-CM | POA: Diagnosis not present

## 2020-12-05 ENCOUNTER — Other Ambulatory Visit: Payer: Self-pay | Admitting: Internal Medicine

## 2020-12-11 DIAGNOSIS — F4310 Post-traumatic stress disorder, unspecified: Secondary | ICD-10-CM | POA: Diagnosis not present

## 2020-12-16 DIAGNOSIS — F4323 Adjustment disorder with mixed anxiety and depressed mood: Secondary | ICD-10-CM | POA: Diagnosis not present

## 2020-12-25 DIAGNOSIS — F4310 Post-traumatic stress disorder, unspecified: Secondary | ICD-10-CM | POA: Diagnosis not present

## 2020-12-30 DIAGNOSIS — F4323 Adjustment disorder with mixed anxiety and depressed mood: Secondary | ICD-10-CM | POA: Diagnosis not present

## 2021-01-01 DIAGNOSIS — F4310 Post-traumatic stress disorder, unspecified: Secondary | ICD-10-CM | POA: Diagnosis not present

## 2021-01-12 ENCOUNTER — Other Ambulatory Visit: Payer: Self-pay | Admitting: Internal Medicine

## 2021-01-14 DIAGNOSIS — F4323 Adjustment disorder with mixed anxiety and depressed mood: Secondary | ICD-10-CM | POA: Diagnosis not present

## 2021-01-15 DIAGNOSIS — F4310 Post-traumatic stress disorder, unspecified: Secondary | ICD-10-CM | POA: Diagnosis not present

## 2021-01-22 DIAGNOSIS — F4310 Post-traumatic stress disorder, unspecified: Secondary | ICD-10-CM | POA: Diagnosis not present

## 2021-01-27 DIAGNOSIS — F4323 Adjustment disorder with mixed anxiety and depressed mood: Secondary | ICD-10-CM | POA: Diagnosis not present

## 2021-01-29 DIAGNOSIS — F4310 Post-traumatic stress disorder, unspecified: Secondary | ICD-10-CM | POA: Diagnosis not present

## 2021-02-09 DIAGNOSIS — F4310 Post-traumatic stress disorder, unspecified: Secondary | ICD-10-CM | POA: Diagnosis not present

## 2021-02-10 DIAGNOSIS — F4323 Adjustment disorder with mixed anxiety and depressed mood: Secondary | ICD-10-CM | POA: Diagnosis not present

## 2021-02-19 DIAGNOSIS — F4310 Post-traumatic stress disorder, unspecified: Secondary | ICD-10-CM | POA: Diagnosis not present

## 2021-02-25 DIAGNOSIS — F4323 Adjustment disorder with mixed anxiety and depressed mood: Secondary | ICD-10-CM | POA: Diagnosis not present

## 2021-02-26 DIAGNOSIS — F4310 Post-traumatic stress disorder, unspecified: Secondary | ICD-10-CM | POA: Diagnosis not present

## 2021-03-02 ENCOUNTER — Ambulatory Visit (INDEPENDENT_AMBULATORY_CARE_PROVIDER_SITE_OTHER): Payer: BC Managed Care – PPO | Admitting: Internal Medicine

## 2021-03-02 ENCOUNTER — Other Ambulatory Visit: Payer: Self-pay

## 2021-03-02 ENCOUNTER — Encounter: Payer: Self-pay | Admitting: Internal Medicine

## 2021-03-02 VITALS — BP 122/70 | HR 78 | Temp 97.8°F | Resp 16 | Ht 65.0 in | Wt 289.0 lb

## 2021-03-02 DIAGNOSIS — K219 Gastro-esophageal reflux disease without esophagitis: Secondary | ICD-10-CM

## 2021-03-02 DIAGNOSIS — D649 Anemia, unspecified: Secondary | ICD-10-CM

## 2021-03-02 DIAGNOSIS — E039 Hypothyroidism, unspecified: Secondary | ICD-10-CM | POA: Diagnosis not present

## 2021-03-02 DIAGNOSIS — J329 Chronic sinusitis, unspecified: Secondary | ICD-10-CM | POA: Insufficient documentation

## 2021-03-02 DIAGNOSIS — Z6841 Body Mass Index (BMI) 40.0 and over, adult: Secondary | ICD-10-CM

## 2021-03-02 DIAGNOSIS — Z13220 Encounter for screening for lipoid disorders: Secondary | ICD-10-CM

## 2021-03-02 DIAGNOSIS — M2550 Pain in unspecified joint: Secondary | ICD-10-CM | POA: Diagnosis not present

## 2021-03-02 MED ORDER — CEFDINIR 300 MG PO CAPS: 300.0000 mg | ORAL_CAPSULE | Freq: Two times a day (BID) | ORAL | 0 refills | Status: DC

## 2021-03-02 NOTE — Assessment & Plan Note (Signed)
Off protonix.  Takes TUMS prn.  Follow.

## 2021-03-02 NOTE — Patient Instructions (Signed)
Saline nasal spray - flush nose 1-2x/day  Nasacort nasal spray - 2 sprays each nostril one time per day.  Do this in the evening.     Robitussin DM twice a day as needed for cough and congestion.

## 2021-03-02 NOTE — Assessment & Plan Note (Signed)
Low carb diet and exercise.  Follow.  

## 2021-03-02 NOTE — Assessment & Plan Note (Signed)
On synthroid.  Per discharge summary, synthroid recently changed.  Follow tsh.

## 2021-03-02 NOTE — Assessment & Plan Note (Signed)
Check cbc with next labs.  

## 2021-03-02 NOTE — Assessment & Plan Note (Addendum)
Persistent symptoms - appear to be c/w sinusitis.  omnicef as directed.  nasacort nasal spray. Robitussin as directed.  Follow.  Call with update.

## 2021-03-02 NOTE — Assessment & Plan Note (Signed)
Joint pain - affecting various joints as outlined.  No rash.  Check esr, crp, ana and routine labs.  Discussed exercise and stretches.  Follow.

## 2021-03-02 NOTE — Progress Notes (Signed)
Patient ID: Anna Vasquez, female   DOB: March 10, 1986, 35 y.o.   MRN: [id number]   Subjective:    Patient ID: Anna Vasquez, female    DOB: April 27, 1985, 35 y.o.   MRN: [id number]  This visit occurred during the SARS-CoV-2 public health emergency.  Safety protocols were in place, including screening questions prior to the visit, additional usage of staff PPE, and extensive cleaning of exam room while observing appropriate contact time as indicated for disinfecting solutions.   Patient here for a scheduled follow up.    Chief Complaint  Patient presents with   Follow-up   Hypothyroidism   .   HPI Originally scheduled for physical.  Gets her breasts, pelvic and pap smears through gyn.  Appt changed to f/u appt.  States her and her family had covid in 11/2020.  Had the flu after Thanksgiving.  Had a respiratory illness between covid and flu.  She still has persistent cough.  Also reports feeling like her nose is swollen.  Some intermittent sinus pressure.  Does notice discomfort over her teeth.  This is similar to previous sinus infection she has experienced.  No chest congestion.  No sob or chest pain.  No nausea or vomiting.  No abdominal pain.  Does report joint pain.  Migrates - can affect knees, hips, hands, back.  This started after pregnancy.  No rash, but does report dry skin.  No previous tick bites.  Some occasional acid reflux.  Takes TUMs prn. Nothing on a regular basis.     Past Medical History:  Diagnosis Date   Frequent headaches    GERD (gastroesophageal reflux disease)    H/O febrile seizure    Hx of migraines    Hypothyroidism    Hypothyroidism    PCOD (polycystic ovarian disease)    Pseudotumor cerebri    Thoracic outlet syndrome    extra cervical ribs   Vitamin D deficiency    Past Surgical History:  Procedure Laterality Date   CARPAL TUNNEL RELEASE     rt and left   LAPAROSCOPIC GASTRIC SLEEVE RESECTION     MOUTH SURGERY  1998   Family History  Problem Relation  Age of Onset   Arthritis Father    Hypertension Father    Sleep apnea Father    Transient ischemic attack Father    COPD Father    Rheum arthritis Mother    Breast cancer Paternal Aunt    Mental illness Paternal Uncle    Esophageal cancer Paternal Uncle    Prostate cancer Paternal Grandfather    Alzheimer's disease Maternal Grandmother    Skin cancer Paternal Grandmother    Social History   Socioeconomic History   Marital status: Married    Spouse name: Not on file   Number of children: Not on file   Years of education: Not on file   Highest education level: Not on file  Occupational History   Not on file  Tobacco Use   Smoking status: Never   Smokeless tobacco: Never  Vaping Use   Vaping Use: Never used  Substance and Sexual Activity   Alcohol use: Yes    Alcohol/week: 0.0 standard drinks   Drug use: No   Sexual activity: Yes  Other Topics Concern   Not on file  Social History Narrative   Not on file   Social Determinants of Health   Financial Resource Strain: Not on file  Food Insecurity: Not on file  Transportation Needs: Not on file  Physical Activity: Not on file  Stress: Not on file  Social Connections: Not on file     Review of Systems  Constitutional:  Negative for appetite change and unexpected weight change.  HENT:  Negative for sinus pressure.        Nose - feels swollen.  Teeth hurt.   Respiratory:  Negative for cough, chest tightness and shortness of breath.   Cardiovascular:  Negative for chest pain, palpitations and leg swelling.  Gastrointestinal:  Negative for abdominal pain, diarrhea, nausea and vomiting.  Genitourinary:  Negative for difficulty urinating and dysuria.  Musculoskeletal:  Negative for myalgias.       Joint pain as outlined.    Skin:  Negative for color change and rash.  Neurological:  Negative for dizziness, light-headedness and headaches.  Psychiatric/Behavioral:  Negative for agitation and dysphoric mood.        Objective:     BP 122/70   Pulse 78   Temp 97.8 F (36.6 C)   Resp 16   Ht 5' 5"  (1.651 m)   Wt 289 lb (131.1 kg)   SpO2 98%   BMI 48.09 kg/m  Wt Readings from Last 3 Encounters:  03/02/21 289 lb (131.1 kg)  02/28/20 265 lb 3.2 oz (120.3 kg)  02/04/20 280 lb 9.6 oz (127.3 kg)    Physical Exam Vitals reviewed.  Constitutional:      General: She is not in acute distress.    Appearance: Normal appearance.  HENT:     Head: Normocephalic and atraumatic.     Right Ear: External ear normal.     Left Ear: External ear normal.  Eyes:     General: No scleral icterus.       Right eye: No discharge.        Left eye: No discharge.     Conjunctiva/sclera: Conjunctivae normal.  Neck:     Thyroid: No thyromegaly.  Cardiovascular:     Rate and Rhythm: Normal rate and regular rhythm.  Pulmonary:     Effort: No respiratory distress.     Breath sounds: Normal breath sounds. No wheezing.  Abdominal:     General: Bowel sounds are normal.     Palpations: Abdomen is soft.     Tenderness: There is no abdominal tenderness.  Musculoskeletal:        General: No swelling or tenderness.     Cervical back: Neck supple. No tenderness.  Lymphadenopathy:     Cervical: No cervical adenopathy.  Skin:    Findings: No erythema or rash.  Neurological:     Mental Status: She is alert.  Psychiatric:        Mood and Affect: Mood normal.        Behavior: Behavior normal.     Outpatient Encounter Medications as of 03/02/2021  Medication Sig   cefdinir (OMNICEF) 300 MG capsule Take 1 capsule (300 mg total) by mouth 2 (two) times daily.   SYNTHROID 112 MCG tablet TAKE 1 TABLET BY MOUTH ONCE DAILY BEFORE BREAKFAST   [DISCONTINUED] pantoprazole (PROTONIX) 40 MG tablet Take 40 mg by mouth daily.   [DISCONTINUED] Prenatal Vit-Fe Fumarate-FA (PRENATAL MULTIVITAMIN) TABS tablet Take 1 tablet by mouth daily at 12 noon.   [DISCONTINUED] SYNTHROID 125 MCG tablet Take 125 mcg by mouth daily.   No  facility-administered encounter medications on file as of 03/02/2021.     Lab Results  Component Value Date   WBC 7.2 02/28/2020   HGB 12.7 02/28/2020   HCT 39.3  02/28/2020   PLT 303.0 02/28/2020   GLUCOSE 88 02/07/2017   CHOL 184 03/07/2015   TRIG 71.0 03/07/2015   HDL 63.60 03/07/2015   LDLCALC 106 (H) 03/07/2015   ALT 18 02/07/2017   AST 21 02/07/2017   NA 138 02/07/2017   K 4.4 02/07/2017   CL 105 02/07/2017   CREATININE 0.77 02/07/2017   BUN 12 02/07/2017   CO2 28 02/07/2017   TSH 1.660 07/10/2020   INR 1.0 08/25/2012   HGBA1C 5.2 08/25/2012       Assessment & Plan:   Problem List Items Addressed This Visit     Anemia - Primary    Check cbc with next labs.       Relevant Orders   CBC with Differential/Platelet   Comprehensive metabolic panel   BMI 94.7-07.6, adult (HCC)    Low carb diet and exercise.  Follow.       GERD (gastroesophageal reflux disease)    Off protonix.  Takes TUMS prn.  Follow.       Hypothyroidism    On synthroid.  Per discharge summary, synthroid recently changed.  Follow tsh.       Relevant Orders   TSH   Joint pain    Joint pain - affecting various joints as outlined.  No rash.  Check esr, crp, ana and routine labs.  Discussed exercise and stretches.  Follow.       Relevant Orders   Sedimentation rate   ANA   Rheumatoid factor   C-reactive protein   Sinusitis    Persistent symptoms - appear to be c/w sinusitis.  omnicef as directed.  nasacort nasal spray. Robitussin as directed.  Follow.  Call with update.       Relevant Medications   cefdinir (OMNICEF) 300 MG capsule   Other Visit Diagnoses     Screening cholesterol level       Relevant Orders   Lipid panel        Einar Pheasant, MD

## 2021-03-03 DIAGNOSIS — Z13220 Encounter for screening for lipoid disorders: Secondary | ICD-10-CM | POA: Diagnosis not present

## 2021-03-03 DIAGNOSIS — M2550 Pain in unspecified joint: Secondary | ICD-10-CM | POA: Diagnosis not present

## 2021-03-03 DIAGNOSIS — E039 Hypothyroidism, unspecified: Secondary | ICD-10-CM | POA: Diagnosis not present

## 2021-03-03 DIAGNOSIS — D649 Anemia, unspecified: Secondary | ICD-10-CM | POA: Diagnosis not present

## 2021-03-04 LAB — CBC WITH DIFFERENTIAL/PLATELET
Basophils Absolute: 0 10*3/uL (ref 0.0–0.2)
Basos: 1 %
EOS (ABSOLUTE): 0.2 10*3/uL (ref 0.0–0.4)
Eos: 4 %
Hematocrit: 39.7 % (ref 34.0–46.6)
Hemoglobin: 12.6 g/dL (ref 11.1–15.9)
Immature Grans (Abs): 0 10*3/uL (ref 0.0–0.1)
Immature Granulocytes: 0 %
Lymphocytes Absolute: 1.5 10*3/uL (ref 0.7–3.1)
Lymphs: 24 %
MCH: 28.6 pg (ref 26.6–33.0)
MCHC: 31.7 g/dL (ref 31.5–35.7)
MCV: 90 fL (ref 79–97)
Monocytes Absolute: 0.5 10*3/uL (ref 0.1–0.9)
Monocytes: 8 %
Neutrophils Absolute: 3.9 10*3/uL (ref 1.4–7.0)
Neutrophils: 63 %
Platelets: 281 10*3/uL (ref 150–450)
RBC: 4.4 x10E6/uL (ref 3.77–5.28)
RDW: 12 % (ref 11.7–15.4)
WBC: 6.2 10*3/uL (ref 3.4–10.8)

## 2021-03-04 LAB — COMPREHENSIVE METABOLIC PANEL
ALT: 12 IU/L (ref 0–32)
AST: 17 IU/L (ref 0–40)
Albumin/Globulin Ratio: 2 (ref 1.2–2.2)
Albumin: 4.4 g/dL (ref 3.8–4.8)
Alkaline Phosphatase: 75 IU/L (ref 44–121)
BUN/Creatinine Ratio: 12 (ref 9–23)
BUN: 9 mg/dL (ref 6–20)
Bilirubin Total: 1.3 mg/dL — ABNORMAL HIGH (ref 0.0–1.2)
CO2: 24 mmol/L (ref 20–29)
Calcium: 9.2 mg/dL (ref 8.7–10.2)
Chloride: 101 mmol/L (ref 96–106)
Creatinine, Ser: 0.73 mg/dL (ref 0.57–1.00)
Globulin, Total: 2.2 g/dL (ref 1.5–4.5)
Glucose: 86 mg/dL (ref 70–99)
Potassium: 4.5 mmol/L (ref 3.5–5.2)
Sodium: 138 mmol/L (ref 134–144)
Total Protein: 6.6 g/dL (ref 6.0–8.5)
eGFR: 109 mL/min/{1.73_m2} (ref >59)

## 2021-03-04 LAB — LIPID PANEL
Chol/HDL Ratio: 2.3 ratio (ref 0.0–4.4)
Cholesterol, Total: 169 mg/dL (ref 100–199)
HDL: 73 mg/dL (ref >39)
LDL Chol Calc (NIH): 82 mg/dL (ref 0–99)
Triglycerides: 76 mg/dL (ref 0–149)
VLDL Cholesterol Cal: 14 mg/dL (ref 5–40)

## 2021-03-04 LAB — TSH: TSH: 3.02 u[IU]/mL (ref 0.450–4.500)

## 2021-03-04 LAB — RHEUMATOID FACTOR: Rheumatoid fact SerPl-aCnc: <10 IU/mL (ref <14.0)

## 2021-03-04 LAB — C-REACTIVE PROTEIN: CRP: 11 mg/L — ABNORMAL HIGH (ref 0–10)

## 2021-03-04 LAB — SEDIMENTATION RATE: Sed Rate: 14 mm/hr (ref 0–32)

## 2021-03-04 LAB — ANA: Anti Nuclear Antibody (ANA): NEGATIVE

## 2021-03-05 DIAGNOSIS — F4310 Post-traumatic stress disorder, unspecified: Secondary | ICD-10-CM | POA: Diagnosis not present

## 2021-03-09 ENCOUNTER — Telehealth: Payer: Self-pay

## 2021-03-09 DIAGNOSIS — R7989 Other specified abnormal findings of blood chemistry: Secondary | ICD-10-CM

## 2021-03-09 NOTE — Telephone Encounter (Signed)
[facility] lab ordered

## 2021-03-10 ENCOUNTER — Telehealth: Payer: Self-pay | Admitting: Internal Medicine

## 2021-03-10 ENCOUNTER — Other Ambulatory Visit: Payer: Self-pay

## 2021-03-10 DIAGNOSIS — E039 Hypothyroidism, unspecified: Secondary | ICD-10-CM

## 2021-03-10 DIAGNOSIS — M2550 Pain in unspecified joint: Secondary | ICD-10-CM

## 2021-03-10 MED ORDER — LEVOTHYROXINE SODIUM 125 MCG PO TABS: 125.0000 ug | ORAL_TABLET | Freq: Every day | ORAL | 3 refills | Status: DC

## 2021-03-10 NOTE — Telephone Encounter (Signed)
Patient Called about a referral, Please call patient back [phone]

## 2021-03-10 NOTE — Telephone Encounter (Signed)
Referral to Dr.Patel

## 2021-03-10 NOTE — Telephone Encounter (Signed)
Referral for Dr Allena Katz placed.

## 2021-03-18 DIAGNOSIS — F4323 Adjustment disorder with mixed anxiety and depressed mood: Secondary | ICD-10-CM | POA: Diagnosis not present

## 2021-03-26 DIAGNOSIS — F4310 Post-traumatic stress disorder, unspecified: Secondary | ICD-10-CM | POA: Diagnosis not present

## 2021-04-02 DIAGNOSIS — F4310 Post-traumatic stress disorder, unspecified: Secondary | ICD-10-CM | POA: Diagnosis not present

## 2021-04-06 DIAGNOSIS — M2550 Pain in unspecified joint: Secondary | ICD-10-CM | POA: Diagnosis not present

## 2021-04-06 DIAGNOSIS — M533 Sacrococcygeal disorders, not elsewhere classified: Secondary | ICD-10-CM | POA: Diagnosis not present

## 2021-04-06 DIAGNOSIS — Z903 Acquired absence of stomach [part of]: Secondary | ICD-10-CM | POA: Diagnosis not present

## 2021-04-06 DIAGNOSIS — G8929 Other chronic pain: Secondary | ICD-10-CM | POA: Diagnosis not present

## 2021-04-06 DIAGNOSIS — M545 Low back pain, unspecified: Secondary | ICD-10-CM | POA: Diagnosis not present

## 2021-04-09 DIAGNOSIS — F4310 Post-traumatic stress disorder, unspecified: Secondary | ICD-10-CM | POA: Diagnosis not present

## 2021-04-27 DIAGNOSIS — R7989 Other specified abnormal findings of blood chemistry: Secondary | ICD-10-CM | POA: Diagnosis not present

## 2021-04-27 DIAGNOSIS — E039 Hypothyroidism, unspecified: Secondary | ICD-10-CM | POA: Diagnosis not present

## 2021-04-28 LAB — HEPATIC FUNCTION PANEL
ALT: 11 IU/L (ref 0–32)
AST: 14 IU/L (ref 0–40)
Albumin: 4.2 g/dL (ref 3.8–4.8)
Alkaline Phosphatase: 74 IU/L (ref 44–121)
Bilirubin Total: 0.8 mg/dL (ref 0.0–1.2)
Bilirubin, Direct: 0.2 mg/dL (ref 0.00–0.40)
Total Protein: 6.5 g/dL (ref 6.0–8.5)

## 2021-04-28 LAB — TSH: TSH: 3.35 u[IU]/mL (ref 0.450–4.500)

## 2021-05-07 DIAGNOSIS — F4310 Post-traumatic stress disorder, unspecified: Secondary | ICD-10-CM | POA: Diagnosis not present

## 2021-05-10 ENCOUNTER — Encounter: Payer: Self-pay | Admitting: Internal Medicine

## 2021-06-05 ENCOUNTER — Encounter: Payer: Self-pay | Admitting: Internal Medicine

## 2021-06-05 ENCOUNTER — Ambulatory Visit (INDEPENDENT_AMBULATORY_CARE_PROVIDER_SITE_OTHER): Payer: BC Managed Care – PPO | Admitting: Internal Medicine

## 2021-06-05 ENCOUNTER — Other Ambulatory Visit: Payer: Self-pay

## 2021-06-05 VITALS — BP 126/74 | HR 66 | Temp 97.9°F | Resp 16 | Ht 66.0 in | Wt 282.0 lb

## 2021-06-05 DIAGNOSIS — Z6841 Body Mass Index (BMI) 40.0 and over, adult: Secondary | ICD-10-CM | POA: Diagnosis not present

## 2021-06-05 DIAGNOSIS — M2550 Pain in unspecified joint: Secondary | ICD-10-CM

## 2021-06-05 DIAGNOSIS — K219 Gastro-esophageal reflux disease without esophagitis: Secondary | ICD-10-CM | POA: Diagnosis not present

## 2021-06-05 DIAGNOSIS — E039 Hypothyroidism, unspecified: Secondary | ICD-10-CM

## 2021-06-05 LAB — TSH: TSH: 2.15 u[IU]/mL (ref 0.35–5.50)

## 2021-06-05 NOTE — Progress Notes (Signed)
Patient ID: Anna Vasquez, female   DOB: 1986/03/10, 36 y.o.   MRN: [id number] ? ? ?Subjective:  ? ? Patient ID: Anna Vasquez, female    DOB: 1986-02-23, 36 y.o.   MRN: [id number] ? ?This visit occurred during the SARS-CoV-2 public health emergency.  Safety protocols were in place, including screening questions prior to the visit, additional usage of staff PPE, and extensive cleaning of exam room while observing appropriate contact time as indicated for disinfecting solutions.  ? ?Patient here for a scheduled follow up.  ? ?Chief Complaint  ?Patient presents with  ? Hypothyroidism  ? .  ? ?HPI ?Recently evaluated by rheumatology - polyarthralgia and morning stiffness.  Some intermittent swelling.  Had labs and xray.  Prescribed meloxicam.  Overall stable.  Discussed exercise.  No chest pain or sob reported.  No cough or congestion reported.  No abdominal pain.  Bowels moving.  Had questions about her synthroid and thyroid test (regarding the trend up of tsh).   ? ? ?Past Medical History:  ?Diagnosis Date  ? Frequent headaches   ? GERD (gastroesophageal reflux disease)   ? H/O febrile seizure   ? Hx of migraines   ? Hypothyroidism   ? Hypothyroidism   ? PCOD (polycystic ovarian disease)   ? Pseudotumor cerebri   ? Thoracic outlet syndrome   ? extra cervical ribs  ? Vitamin D deficiency   ? ?Past Surgical History:  ?Procedure Laterality Date  ? CARPAL TUNNEL RELEASE    ? rt and left  ? LAPAROSCOPIC GASTRIC SLEEVE RESECTION    ? MOUTH SURGERY  1998  ? ?Family History  ?Problem Relation Age of Onset  ? Arthritis Father   ? Hypertension Father   ? Sleep apnea Father   ? Transient ischemic attack Father   ? COPD Father   ? Rheum arthritis Mother   ? Breast cancer Paternal Aunt   ? Mental illness Paternal Uncle   ? Esophageal cancer Paternal Uncle   ? Prostate cancer Paternal Grandfather   ? Alzheimer's disease Maternal Grandmother   ? Skin cancer Paternal Grandmother   ? ?Social History  ? ?Socioeconomic History  ?  Marital status: Married  ?  Spouse name: Not on file  ? Number of children: Not on file  ? Years of education: Not on file  ? Highest education level: Not on file  ?Occupational History  ? Not on file  ?Tobacco Use  ? Smoking status: Never  ? Smokeless tobacco: Never  ?Vaping Use  ? Vaping Use: Never used  ?Substance and Sexual Activity  ? Alcohol use: Yes  ?  Alcohol/week: 0.0 standard drinks  ? Drug use: No  ? Sexual activity: Yes  ?Other Topics Concern  ? Not on file  ?Social History Narrative  ? Not on file  ? ?Social Determinants of Health  ? ?Financial Resource Strain: Not on file  ?Food Insecurity: Not on file  ?Transportation Needs: Not on file  ?Physical Activity: Not on file  ?Stress: Not on file  ?Social Connections: Not on file  ? ? ? ?Review of Systems  ?Constitutional:  Negative for appetite change and unexpected weight change.  ?HENT:  Negative for congestion and sinus pressure.   ?Respiratory:  Negative for cough, chest tightness and shortness of breath.   ?Cardiovascular:  Negative for chest pain, palpitations and leg swelling.  ?Gastrointestinal:  Negative for abdominal pain, diarrhea, nausea and vomiting.  ?Genitourinary:  Negative for difficulty urinating and dysuria.  ?  Musculoskeletal:  Negative for joint swelling and myalgias.  ?Skin:  Negative for color change and rash.  ?Neurological:  Negative for dizziness, light-headedness and headaches.  ?Psychiatric/Behavioral:  Negative for agitation and dysphoric mood.   ? ?   ?Objective:  ?  ? ?BP 126/74   Pulse 66   Temp 97.9 ?F (36.6 ?C)   Resp 16   Ht 5\' 6"  (1.676 m)   Wt 282 lb (127.9 kg)   SpO2 99%   BMI 45.52 kg/m?  ?Wt Readings from Last 3 Encounters:  ?06/05/21 282 lb (127.9 kg)  ?03/02/21 289 lb (131.1 kg)  ?02/28/20 265 lb 3.2 oz (120.3 kg)  ? ? ?Physical Exam ?Vitals reviewed.  ?Constitutional:   ?   General: She is not in acute distress. ?   Appearance: Normal appearance.  ?HENT:  ?   Head: Normocephalic and atraumatic.  ?   Right  Ear: External ear normal.  ?   Left Ear: External ear normal.  ?Eyes:  ?   General: No scleral icterus.    ?   Right eye: No discharge.     ?   Left eye: No discharge.  ?   Conjunctiva/sclera: Conjunctivae normal.  ?Neck:  ?   Thyroid: No thyromegaly.  ?Cardiovascular:  ?   Rate and Rhythm: Normal rate and regular rhythm.  ?Pulmonary:  ?   Effort: No respiratory distress.  ?   Breath sounds: Normal breath sounds. No wheezing.  ?Abdominal:  ?   General: Bowel sounds are normal.  ?   Palpations: Abdomen is soft.  ?   Tenderness: There is no abdominal tenderness.  ?Musculoskeletal:     ?   General: No swelling or tenderness.  ?   Cervical back: Neck supple. No tenderness.  ?Lymphadenopathy:  ?   Cervical: No cervical adenopathy.  ?Skin: ?   Findings: No erythema or rash.  ?Neurological:  ?   Mental Status: She is alert.  ?Psychiatric:     ?   Mood and Affect: Mood normal.     ?   Behavior: Behavior normal.  ? ? ? ?Outpatient Encounter Medications as of 06/05/2021  ?Medication Sig  ? levothyroxine (SYNTHROID) 125 MCG tablet Take 1 tablet (125 mcg total) by mouth daily.  ? [DISCONTINUED] cefdinir (OMNICEF) 300 MG capsule Take 1 capsule (300 mg total) by mouth 2 (two) times daily.  ? ?No facility-administered encounter medications on file as of 06/05/2021.  ?  ? ?Lab Results  ?Component Value Date  ? WBC 6.2 03/03/2021  ? HGB 12.6 03/03/2021  ? HCT 39.7 03/03/2021  ? PLT 281 03/03/2021  ? GLUCOSE 86 03/03/2021  ? CHOL 169 03/03/2021  ? TRIG 76 03/03/2021  ? HDL 73 03/03/2021  ? LDLCALC 82 03/03/2021  ? ALT 11 04/27/2021  ? AST 14 04/27/2021  ? NA 138 03/03/2021  ? K 4.5 03/03/2021  ? CL 101 03/03/2021  ? CREATININE 0.73 03/03/2021  ? BUN 9 03/03/2021  ? CO2 24 03/03/2021  ? TSH 2.15 06/05/2021  ? INR 1.0 08/25/2012  ? HGBA1C 5.2 08/25/2012  ? ? ?   ?Assessment & Plan:  ? ?Problem List Items Addressed This Visit   ? ? BMI 45.0-49.9, adult (HCC)  ?  Diet and exercise.  Follow   ?  ?  ? GERD (gastroesophageal reflux disease)   ?  No acid reflux reported.  On no medication.   ?  ?  ? Hypothyroidism - Primary  ?  On synthroid.  Discussed recent TSH levels.  She is taking her synthroid correctly.  Discussed name brand.  Check tsh today.  ?  ?  ? Relevant Orders  ? TSH (Completed)  ? Joint pain  ?  Saw rheumatology.  W/up as outlined.  Has meloxicam to take if needed.  Overall stable.  Follow.  ?  ?  ? ? ? ?Dale Durhamharlene Dallys Nowakowski, MD  ?

## 2021-06-06 ENCOUNTER — Encounter: Payer: Self-pay | Admitting: Internal Medicine

## 2021-06-06 NOTE — Assessment & Plan Note (Signed)
Diet and exercise.  Follow.  

## 2021-06-06 NOTE — Assessment & Plan Note (Signed)
On synthroid.  Discussed recent TSH levels.  She is taking her synthroid correctly.  Discussed name brand.  Check tsh today.  ?

## 2021-06-06 NOTE — Assessment & Plan Note (Signed)
Saw rheumatology.  W/up as outlined.  Has meloxicam to take if needed.  Overall stable.  Follow.  ?

## 2021-06-06 NOTE — Assessment & Plan Note (Signed)
No acid reflux reported.  On no medication.   ?

## 2021-10-02 ENCOUNTER — Encounter: Payer: Self-pay | Admitting: Internal Medicine

## 2021-10-15 DIAGNOSIS — E039 Hypothyroidism, unspecified: Secondary | ICD-10-CM | POA: Diagnosis not present

## 2021-10-15 DIAGNOSIS — G932 Benign intracranial hypertension: Secondary | ICD-10-CM | POA: Diagnosis not present

## 2021-10-15 DIAGNOSIS — Z9884 Bariatric surgery status: Secondary | ICD-10-CM | POA: Diagnosis not present

## 2021-10-19 ENCOUNTER — Encounter: Payer: Self-pay | Admitting: Internal Medicine

## 2021-10-19 ENCOUNTER — Ambulatory Visit (INDEPENDENT_AMBULATORY_CARE_PROVIDER_SITE_OTHER): Payer: BC Managed Care – PPO | Admitting: Internal Medicine

## 2021-10-19 DIAGNOSIS — E039 Hypothyroidism, unspecified: Secondary | ICD-10-CM

## 2021-10-19 DIAGNOSIS — D649 Anemia, unspecified: Secondary | ICD-10-CM

## 2021-10-19 DIAGNOSIS — G932 Benign intracranial hypertension: Secondary | ICD-10-CM | POA: Diagnosis not present

## 2021-10-19 DIAGNOSIS — K219 Gastro-esophageal reflux disease without esophagitis: Secondary | ICD-10-CM

## 2021-10-19 NOTE — Patient Instructions (Signed)
Pepcid (famotidine) - 20mg  - take one tablet 30 minutes before breakfast and 30 minutes before evening meal

## 2021-10-19 NOTE — Progress Notes (Signed)
Patient ID: Anna Vasquez, female   DOB: [birth year], 36 y.o.   MRN: [id number]   Subjective:    Patient ID: Anna Vasquez, female    DOB: 1986-03-19, 36 y.o.   MRN: [id number]   Patient here for work in appt.     HPI Work in appt to discuss her weight.  Stopped breast feeding in 02/2021.  She is s/p lap sleeve gastrectomy in 2014.  Did well with weight loss initially.  After having children and with the pandemic, she gained weight.  She has tried various diets, including atkins, whole life 30, bariatric diet and portion control.  She has been exercising.  Going to the [facility] and walking.  Unable to lose weight.  Has been experiencing increased acid reflux.  Taking 6 TUMS per day.  No abdominal pian.  Some decreased appetite.  No vomiting.  Increased fatigue and joint pain.  Denies chest pain.  Breathing overall stable.  She does have a history of pseudotumor cerebri and desires to get her weight down so that this does not become more if an issue.     Past Medical History:  Diagnosis Date   Frequent headaches    GERD (gastroesophageal reflux disease)    H/O febrile seizure    Hx of migraines    Hypothyroidism    Hypothyroidism    PCOD (polycystic ovarian disease)    Pseudotumor cerebri    Thoracic outlet syndrome    extra cervical ribs   Vitamin D deficiency    Past Surgical History:  Procedure Laterality Date   CARPAL TUNNEL RELEASE     rt and left   LAPAROSCOPIC GASTRIC SLEEVE RESECTION     MOUTH SURGERY  1998   Family History  Problem Relation Age of Onset   Arthritis Father    Hypertension Father    Sleep apnea Father    Transient ischemic attack Father    COPD Father    Rheum arthritis Mother    Breast cancer Paternal Aunt    Mental illness Paternal Uncle    Esophageal cancer Paternal Uncle    Prostate cancer Paternal Grandfather    Alzheimer's disease Maternal Grandmother    Skin cancer Paternal Grandmother    Social History   Socioeconomic History   Marital  status: Married    Spouse name: Not on file   Number of children: Not on file   Years of education: Not on file   Highest education level: Not on file  Occupational History   Not on file  Tobacco Use   Smoking status: Never   Smokeless tobacco: Never  Vaping Use   Vaping Use: Never used  Substance and Sexual Activity   Alcohol use: Yes    Alcohol/week: 0.0 standard drinks of alcohol   Drug use: No   Sexual activity: Yes  Other Topics Concern   Not on file  Social History Narrative   Not on file   Social Determinants of Health   Financial Resource Strain: Low Risk  (02/23/2019)   Overall Financial Resource Strain (CARDIA)    Difficulty of Paying Living Expenses: Not hard at all  Food Insecurity: No Food Insecurity (02/23/2019)   Hunger Vital Sign    Worried About Running Out of Food in the Last Year: Never true    Ran Out of Food in the Last Year: Never true  Transportation Needs: Unknown (02/23/2019)   PRAPARE - Transportation    Lack of Transportation (Medical): No    Lack  of Transportation (Non-Medical): Not on file  Physical Activity: Not on file  Stress: No Stress Concern Present (02/23/2019)   [employer] of Occupational Health - Occupational Stress Questionnaire    Feeling of Stress : Only a little  Social Connections: Not on file     Review of Systems  Constitutional:  Positive for fatigue.       Decreased appetite.  Concerned regarding weight gain.   HENT:  Negative for congestion and sinus pressure.   Respiratory:  Negative for cough, chest tightness and shortness of breath.   Cardiovascular:  Negative for chest pain, palpitations and leg swelling.  Gastrointestinal:  Negative for abdominal pain, diarrhea, nausea and vomiting.  Genitourinary:  Negative for difficulty urinating and dysuria.  Musculoskeletal:  Negative for joint swelling and myalgias.  Skin:  Negative for color change and rash.  Neurological:  Negative for dizziness, light-headedness and  headaches.  Psychiatric/Behavioral:  Negative for agitation and dysphoric mood.        Objective:     BP 136/80 (BP Location: Left Arm, Patient Position: Sitting, Cuff Size: Large)   Pulse 86   Temp 98 F (36.7 C) (Temporal)   Resp 17   Ht 5\' 6"  (1.676 m)   Wt 287 lb 6.4 oz (130.4 kg)   SpO2 98%   BMI 46.39 kg/m  Wt Readings from Last 3 Encounters:  10/19/21 287 lb 6.4 oz (130.4 kg)  06/05/21 282 lb (127.9 kg)  03/02/21 289 lb (131.1 kg)    Physical Exam Vitals reviewed.  Constitutional:      General: She is not in acute distress.    Appearance: Normal appearance.  HENT:     Head: Normocephalic and atraumatic.     Right Ear: External ear normal.     Left Ear: External ear normal.  Eyes:     General: No scleral icterus.       Right eye: No discharge.        Left eye: No discharge.     Conjunctiva/sclera: Conjunctivae normal.  Neck:     Thyroid: No thyromegaly.  Cardiovascular:     Rate and Rhythm: Normal rate and regular rhythm.  Pulmonary:     Effort: No respiratory distress.     Breath sounds: Normal breath sounds. No wheezing.  Abdominal:     General: Bowel sounds are normal.     Palpations: Abdomen is soft.     Tenderness: There is no abdominal tenderness.  Musculoskeletal:        General: No swelling or tenderness.     Cervical back: Neck supple. No tenderness.  Lymphadenopathy:     Cervical: No cervical adenopathy.  Skin:    Findings: No erythema or rash.  Neurological:     Mental Status: She is alert.  Psychiatric:        Mood and Affect: Mood normal.        Behavior: Behavior normal.      Outpatient Encounter Medications as of 10/19/2021  Medication Sig   levothyroxine (SYNTHROID) 125 MCG tablet Take 1 tablet (125 mcg total) by mouth daily.   No facility-administered encounter medications on file as of 10/19/2021.     Lab Results  Component Value Date   WBC 6.2 03/03/2021   HGB 12.6 03/03/2021   HCT 39.7 03/03/2021   PLT 281 03/03/2021    GLUCOSE 86 03/03/2021   CHOL 169 03/03/2021   TRIG 76 03/03/2021   HDL 73 03/03/2021   LDLCALC 82 03/03/2021  ALT 11 04/27/2021   AST 14 04/27/2021   NA 138 03/03/2021   K 4.5 03/03/2021   CL 101 03/03/2021   CREATININE 0.73 03/03/2021   BUN 9 03/03/2021   CO2 24 03/03/2021   TSH 2.15 06/05/2021   INR 1.0 08/25/2012   HGBA1C 5.2 08/25/2012       Assessment & Plan:   Problem List Items Addressed This Visit     Anemia    Recent hgb check 13.2.  Follow.       Relevant Orders   CBC with Differential/Platelet   GERD (gastroesophageal reflux disease)    Has had EGD previously 10/2012 - possible mild reflux esophagitis at GE junction.  Small erosion vs trauma.  With increased acid reflux recently.  Taking 6 TUMS daily.  Avoid foods that aggravate.  Avoid eating late.  Start pepcid bid.  Follow closely.  Planning UGI as outlined.      Hypothyroidism    On synthroid. Follow tsh.       Relevant Orders   TSH   Iron excess    Increased iron level as outlined.  Not taking any iron.  Was on prenatal vitamins.  Off now.  No longer breast feeding.  Recheck cbc and iron studies.  Also check hemochromatosis profile.        Relevant Orders   IBC + Ferritin   Pseudotumor cerebri    No headaches currently.  Did well previously with weight loss.  Discussed importance of weight loss.  Follow.       Severe obesity (BMI >= 40) (HCC)    Discussed diet and exercise.  Discussed weight loss surgery as outlined.  Plans to f/u with surgery for further testing.  Labs reviewed as outlined.  UGI planned.  Letter dictated.         Dale [place], MD

## 2021-10-20 ENCOUNTER — Other Ambulatory Visit: Payer: Self-pay | Admitting: General Surgery

## 2021-10-20 ENCOUNTER — Other Ambulatory Visit (HOSPITAL_COMMUNITY): Payer: Self-pay | Admitting: General Surgery

## 2021-10-22 DIAGNOSIS — F5089 Other specified eating disorder: Secondary | ICD-10-CM | POA: Diagnosis not present

## 2021-10-25 ENCOUNTER — Encounter: Payer: Self-pay | Admitting: Internal Medicine

## 2021-10-25 NOTE — Assessment & Plan Note (Signed)
Has had EGD previously 10/2012 - possible mild reflux esophagitis at GE junction.  Small erosion vs trauma.  With increased acid reflux recently.  Taking 6 TUMS daily.  Avoid foods that aggravate.  Avoid eating late.  Start pepcid bid.  Follow closely.  Planning UGI as outlined.

## 2021-10-25 NOTE — Assessment & Plan Note (Signed)
Recent hgb check 13.2.  Follow.

## 2021-10-25 NOTE — Assessment & Plan Note (Signed)
Increased iron level as outlined.  Not taking any iron.  Was on prenatal vitamins.  Off now.  No longer breast feeding.  Recheck cbc and iron studies.  Also check hemochromatosis profile.

## 2021-10-25 NOTE — Assessment & Plan Note (Signed)
No headaches currently.  Did well previously with weight loss.  Discussed importance of weight loss.  Follow.  

## 2021-10-25 NOTE — Assessment & Plan Note (Signed)
On synthroid.  Follow tsh.   

## 2021-10-25 NOTE — Assessment & Plan Note (Addendum)
Discussed diet and exercise.  Discussed weight loss surgery as outlined.  Plans to f/u with surgery for further testing.  Labs reviewed as outlined.  UGI planned.  Letter dictated.

## 2021-10-26 ENCOUNTER — Encounter: Payer: BC Managed Care – PPO | Attending: Internal Medicine | Admitting: Dietician

## 2021-10-26 ENCOUNTER — Encounter: Payer: Self-pay | Admitting: Dietician

## 2021-10-26 NOTE — Progress Notes (Signed)
Nutrition Assessment for Bariatric Surgery Medical Nutrition Therapy Appt Start Time: 1:55    End Time: 2:49  Patient was seen on 10/26/2021 for Pre-Operative Nutrition Assessment. Letter of approval faxed to [facility] bariatric surgery program coordinator on 10/26/2021.   Referral stated Supervised Weight Loss (SWL) visits needed: 0  Pt completed visits.   Pt has cleared nutrition requirements.   Planned surgery: conversion from Sleeve Gastrectomy to RYGB   NUTRITION ASSESSMENT   Anthropometrics  Start weight at [facility]: 289.5 lbs (date: 10/26/2021)  Height: 65.5 in BMI: 47.44 kg/m2     Clinical  Medical hx: GERD, Obesity, Thyroid disease Medications: Synthroid  Labs: see EMR Notable signs/symptoms: none noted Any previous deficiencies? No  Micronutrient Nutrition Focused Physical Exam: Hair: No issues observed Eyes: No issues observed Mouth: No issues observed Neck: No issues observed Nails: No issues observed Skin: No issues observed  Lifestyle & Dietary Hx  Patient states she lives with husband and two small children. The pt performs the food shopping and the pt prepares the meals. She reports that she typically skips or misses 2 out of 21 possible meals per week. She may have 1-2 meals per week that are take-out or at a restaurant.  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 She denies binge eating and denies feeling shame and/or guilt after eating too much food.  She denies having used laxatives or vomiting to facilitate weight loss. She states that she knows the difference between hunger and thirst and can tell when she is full. Pt states after 2 pregnancies and breast feeding, she gained more weight.  Pt states she stopped breast feeding in December 2022. Pt states she was taking a prenatal vitamin during pregnancy and stopped taking the bariatric multivitamin.  Pt states she will start taking the bariatric vitamins and calcium  again. Pt states she knows she needs to drink more water/fluids, stating she drinks about 24 oz of water a day.  Pt states she drinks two cups of coffee a day.    24-Hr Dietary Recall First Meal: coffee, honey, cream or milk; rosemary bread with an egg or greek yogurt with granola, fruit/berries Snack: late breakfast Second Meal: Deli [place] meat with cheese or salad or left overs from the night before, with cottage cheese/ranch Snack: cheese stick, apple, just fruit (fruit bar) Third Meal: protein (chicken), vegetable, complex carbohydrate (not much red meat) Snack: popcorn (home popped) with salt; sometimes ice cream Beverages: coffee (2 cups a day), water (approx 24 oz)   Estimated Energy Needs Calories: 1600   NUTRITION DIAGNOSIS  Overweight/obesity ([place]-3.3) related to past poor dietary habits and physical inactivity as evidenced by patient w/ planned conversion from Sleeve Gastrectomy to RYGB surgery following dietary guidelines for continued weight loss.    NUTRITION INTERVENTION  Nutrition counseling (C-1) and education (E-2) to facilitate bariatric surgery goals.  Educated pt on micronutrient deficiencies post surgery and strategies to mitigate that risk   Pre-Op Goals Reviewed with the Patient Track food and beverage intake (pen and paper, MyFitness Pal, Baritastic app, etc.) Make healthy food choices while monitoring portion sizes Consume 3 meals per day or try to eat every 3-5 hours Avoid concentrated sugars and fried foods Keep sugar & fat in the single digits per serving on food labels Practice CHEWING your food (aim for applesauce consistency) Practice not drinking 15 minutes before, during, and 30 minutes after each meal and snack Avoid all carbonated beverages (ex: soda, sparkling beverages)  Limit  caffeinated beverages (ex: coffee, tea, energy drinks) Avoid all sugar-sweetened beverages (ex: regular soda, sports drinks)  Avoid alcohol  Aim for 64-100 ounces of  FLUID daily (with at least half of fluid intake being plain water)  Aim for at least 60-80 grams of PROTEIN daily Look for a liquid protein source that contains ?15 g protein and ?5 g carbohydrate (ex: shakes, drinks, shots) Make a list of non-food related activities Physical activity is an important part of a healthy lifestyle so keep it moving! The goal is to reach 150 minutes of exercise per week, including cardiovascular and weight baring activity.  *Goals that are bolded indicate the pt would like to start working towards these  Purpose of hydration: Water makes up over 50% of your total body water, and is part of many organs throughout the body. Water is essential to transport digested nutrients, regulate body temperature, rid the body of waste products, and protects joints and the spinal cord. When not properly hydrated you will begin to experience headaches, cramps and dizziness. Further dehydration can result in rapid heart rate, shock, oliguria, and may cause seizures.  https://www.merckmanuals.com/home/hormonal-and-metabolic-disordehttps://www.usgs.gov/special-topic/water-science-school/science/water-you-water-and-human-body?qt-science_center_objects=0#qt-science_center_objectsrs/water-balance/about-body-water FriendLock.it InvestmentInstructor.com.cy FurEliminator.es https://www.health.BasicFM.no   Handouts Provided Include  Bariatric Surgery handouts (Nutrition Visits, Pre-Op Goals, Protein Shakes, Vitamins & Minerals)  Learning Style & Readiness for Change Teaching method utilized: Visual & Auditory  Demonstrated degree of understanding via: Teach Back  Readiness Level: preparation  Barriers to learning/adherence to lifestyle change: none identified  RD's Notes for Next Visit    MONITORING & EVALUATION Dietary intake, weekly physical activity, body weight, and pre-op goals reached at next nutrition visit.    Next Steps  Pt has completed visits. No further supervised visits required/recomended  Patient is to follow up at [facility] for Pre-Op Class >2 weeks before surgery for further nutrition education.

## 2021-10-27 ENCOUNTER — Telehealth: Payer: Self-pay | Admitting: Internal Medicine

## 2021-10-27 ENCOUNTER — Other Ambulatory Visit: Payer: Self-pay

## 2021-10-27 ENCOUNTER — Telehealth: Payer: Self-pay

## 2021-10-27 DIAGNOSIS — D649 Anemia, unspecified: Secondary | ICD-10-CM

## 2021-10-27 NOTE — Telephone Encounter (Signed)
Pt called in stating that she stopped by a [facility] to get her labs done... Pt stated that [facility] advised her that there was no labs in system... Lab in system states [facility]... Pt is request location change to [facility]... Pt requesting callback.Marland KitchenMarland Kitchen

## 2021-10-27 NOTE — Telephone Encounter (Signed)
Sent - pt advised 

## 2021-10-27 NOTE — Telephone Encounter (Signed)
Pt request lab orders to be sent to [facility]. Pt advised sent

## 2021-10-28 LAB — IRON,TIBC AND FERRITIN PANEL
Ferritin: 23 ng/mL (ref 15–150)
Iron Saturation: 15 % (ref 15–55)
Iron: 54 ug/dL (ref 27–159)
Total Iron Binding Capacity: 354 ug/dL (ref 250–450)
UIBC: 300 ug/dL (ref 131–425)

## 2021-10-28 LAB — CBC WITH DIFFERENTIAL/PLATELET
Basophils Absolute: 0 10*3/uL (ref 0.0–0.2)
Basos: 0 %
EOS (ABSOLUTE): 0.4 10*3/uL (ref 0.0–0.4)
Eos: 4 %
Hematocrit: 40.1 % (ref 34.0–46.6)
Hemoglobin: 13.1 g/dL (ref 11.1–15.9)
Immature Grans (Abs): 0 10*3/uL (ref 0.0–0.1)
Immature Granulocytes: 0 %
Lymphocytes Absolute: 1.9 10*3/uL (ref 0.7–3.1)
Lymphs: 21 %
MCH: 27.8 pg (ref 26.6–33.0)
MCHC: 32.7 g/dL (ref 31.5–35.7)
MCV: 85 fL (ref 79–97)
Monocytes Absolute: 0.4 10*3/uL (ref 0.1–0.9)
Monocytes: 5 %
Neutrophils Absolute: 6.3 10*3/uL (ref 1.4–7.0)
Neutrophils: 70 %
Platelets: 287 10*3/uL (ref 150–450)
RBC: 4.72 x10E6/uL (ref 3.77–5.28)
RDW: 12.3 % (ref 11.7–15.4)
WBC: 9.1 10*3/uL (ref 3.4–10.8)

## 2021-10-28 LAB — TSH: TSH: 1.32 u[IU]/mL (ref 0.450–4.500)

## 2021-11-05 ENCOUNTER — Ambulatory Visit (HOSPITAL_COMMUNITY)
Admission: RE | Admit: 2021-11-05 | Discharge: 2021-11-05 | Disposition: A | Discharge status: Home / Self Care | Payer: BC Managed Care – PPO | Source: Ambulatory Visit | Attending: General Surgery | Admitting: General Surgery

## 2021-11-05 ENCOUNTER — Other Ambulatory Visit: Payer: Self-pay

## 2021-11-05 DIAGNOSIS — Z01818 Encounter for other preprocedural examination: Secondary | ICD-10-CM | POA: Diagnosis not present

## 2021-11-05 DIAGNOSIS — K449 Diaphragmatic hernia without obstruction or gangrene: Secondary | ICD-10-CM | POA: Diagnosis not present

## 2021-12-07 ENCOUNTER — Ambulatory Visit: Payer: BC Managed Care – PPO | Admitting: Internal Medicine

## 2021-12-27 ENCOUNTER — Telehealth: Payer: Self-pay | Admitting: Internal Medicine

## 2021-12-27 NOTE — Telephone Encounter (Signed)
Letter typed for approval for bypass surgery.

## 2021-12-28 ENCOUNTER — Ambulatory Visit: Payer: BC Managed Care – PPO

## 2021-12-29 ENCOUNTER — Telehealth: Payer: Self-pay

## 2021-12-29 NOTE — Telephone Encounter (Signed)
I called pateint tot let her know letter of medical necessity for bariatric surgery was ready. I asked where it needed to be faxed etc. She said that insurance had already denied but did not know all was submitted without Dr. Bary Leriche letter. She will call us back if letter is still needed & let us know where to fax.

## 2022-01-11 ENCOUNTER — Ambulatory Visit: Payer: BC Managed Care – PPO | Admitting: Internal Medicine

## 2022-01-13 ENCOUNTER — Ambulatory Visit: Payer: BC Managed Care – PPO | Admitting: Internal Medicine

## 2022-01-14 ENCOUNTER — Ambulatory Visit (INDEPENDENT_AMBULATORY_CARE_PROVIDER_SITE_OTHER): Payer: BC Managed Care – PPO | Admitting: Internal Medicine

## 2022-01-14 ENCOUNTER — Encounter: Payer: Self-pay | Admitting: Internal Medicine

## 2022-01-14 DIAGNOSIS — M2550 Pain in unspecified joint: Secondary | ICD-10-CM | POA: Diagnosis not present

## 2022-01-14 DIAGNOSIS — G932 Benign intracranial hypertension: Secondary | ICD-10-CM

## 2022-01-14 DIAGNOSIS — E039 Hypothyroidism, unspecified: Secondary | ICD-10-CM

## 2022-01-14 DIAGNOSIS — K219 Gastro-esophageal reflux disease without esophagitis: Secondary | ICD-10-CM | POA: Diagnosis not present

## 2022-01-14 NOTE — Progress Notes (Signed)
Patient ID: Anna Vasquez, female   DOB: 1985/09/19, 36 y.o.   MRN: [id number]   Subjective:    Patient ID: Anna Vasquez, female    DOB: [birth year], 36 y.o.   MRN: [id number]   Patient here for  Chief Complaint  Patient presents with   Follow-up    6 month f/u   .   HPI Here to follow up regarding hypothyroidism and GERD.  Recent evaluation for possible bariatric surgery.  Has been evaluated by surgery.  Recent UGI - small hiatal hernia.  No chest pain.  Breathing stable.  No abdominal pain reported.  Bowels moving.  Father - carrier - alpha 1 antitrypsin carrier. Joint pain - right - heel.  Is walking.  Watching diet.    Past Medical History:  Diagnosis Date   Frequent headaches    GERD (gastroesophageal reflux disease)    H/O febrile seizure    Hx of migraines    Hypothyroidism    Hypothyroidism    PCOD (polycystic ovarian disease)    Pseudotumor cerebri    Thoracic outlet syndrome    extra cervical ribs   Vitamin D deficiency    Past Surgical History:  Procedure Laterality Date   CARPAL TUNNEL RELEASE     rt and left   LAPAROSCOPIC GASTRIC SLEEVE RESECTION     MOUTH SURGERY  1998   Family History  Problem Relation Age of Onset   Arthritis Father    Hypertension Father    Sleep apnea Father    Transient ischemic attack Father    COPD Father    Rheum arthritis Mother    Breast cancer Paternal Aunt    Mental illness Paternal Uncle    Esophageal cancer Paternal Uncle    Prostate cancer Paternal Grandfather    Alzheimer's disease Maternal Grandmother    Skin cancer Paternal Grandmother    Social History   Socioeconomic History   Marital status: Married    Spouse name: Not on file   Number of children: Not on file   Years of education: Not on file   Highest education level: Not on file  Occupational History   Not on file  Tobacco Use   Smoking status: Never   Smokeless tobacco: Never  Vaping Use   Vaping Use: Never used  Substance and Sexual  Activity   Alcohol use: Yes    Alcohol/week: 0.0 standard drinks of alcohol   Drug use: No   Sexual activity: Yes  Other Topics Concern   Not on file  Social History Narrative   Not on file   Social Determinants of Health   Financial Resource Strain: Low Risk  (02/23/2019)   Overall Financial Resource Strain (CARDIA)    Difficulty of Paying Living Expenses: Not hard at all  Food Insecurity: No Food Insecurity (02/23/2019)   Hunger Vital Sign    Worried About Running Out of Food in the Last Year: Never true    Ran Out of Food in the Last Year: Never true  Transportation Needs: Unknown (02/23/2019)   PRAPARE - Administrator, Civil Service (Medical): No    Lack of Transportation (Non-Medical): Not on file  Physical Activity: Not on file  Stress: No Stress Concern Present (02/23/2019)   [employer] of Occupational Health - Occupational Stress Questionnaire    Feeling of Stress : Only a little  Social Connections: Not on file     Review of Systems  Constitutional:  Negative for appetite change  and unexpected weight change.  HENT:  Negative for congestion and sinus pressure.   Respiratory:  Negative for cough, chest tightness and shortness of breath.   Cardiovascular:  Negative for chest pain, palpitations and leg swelling.  Gastrointestinal:  Negative for abdominal pain, diarrhea, nausea and vomiting.  Genitourinary:  Negative for difficulty urinating and dysuria.  Musculoskeletal:  Negative for joint swelling and myalgias.  Skin:  Negative for color change and rash.  Neurological:  Negative for dizziness, light-headedness and headaches.  Psychiatric/Behavioral:  Negative for agitation and dysphoric mood.        Objective:     BP 118/80 (BP Location: Left Arm, Patient Position: Sitting, Cuff Size: Large)   Pulse (!) 56   Temp 98.1 F (36.7 C) (Oral)   Ht 5' 5.5" (1.664 m)   Wt 288 lb 6.4 oz (130.8 kg)   SpO2 99%   BMI 47.26 kg/m  Wt Readings from Last  3 Encounters:  01/14/22 288 lb 6.4 oz (130.8 kg)  10/26/21 289 lb 8 oz (131.3 kg)  10/19/21 287 lb 6.4 oz (130.4 kg)    Physical Exam Vitals reviewed.  Constitutional:      General: She is not in acute distress.    Appearance: Normal appearance.  HENT:     Head: Normocephalic and atraumatic.     Right Ear: External ear normal.     Left Ear: External ear normal.  Eyes:     General: No scleral icterus.       Right eye: No discharge.        Left eye: No discharge.     Conjunctiva/sclera: Conjunctivae normal.  Neck:     Thyroid: No thyromegaly.  Cardiovascular:     Rate and Rhythm: Normal rate and regular rhythm.  Pulmonary:     Effort: No respiratory distress.     Breath sounds: Normal breath sounds. No wheezing.  Abdominal:     General: Bowel sounds are normal.     Palpations: Abdomen is soft.     Tenderness: There is no abdominal tenderness.  Musculoskeletal:        General: No swelling or tenderness.     Cervical back: Neck supple. No tenderness.  Lymphadenopathy:     Cervical: No cervical adenopathy.  Skin:    Findings: No erythema or rash.  Neurological:     Mental Status: She is alert.  Psychiatric:        Mood and Affect: Mood normal.        Behavior: Behavior normal.      Outpatient Encounter Medications as of 01/14/2022  Medication Sig   levothyroxine (SYNTHROID) 125 MCG tablet Take 1 tablet (125 mcg total) by mouth daily.   pantoprazole (PROTONIX) 40 MG tablet Take 40 mg by mouth daily.   No facility-administered encounter medications on file as of 01/14/2022.     Lab Results  Component Value Date   WBC 9.1 10/27/2021   HGB 13.1 10/27/2021   HCT 40.1 10/27/2021   PLT 287 10/27/2021   GLUCOSE 86 03/03/2021   CHOL 169 03/03/2021   TRIG 76 03/03/2021   HDL 73 03/03/2021   LDLCALC 82 03/03/2021   ALT 11 04/27/2021   AST 14 04/27/2021   NA 138 03/03/2021   K 4.5 03/03/2021   CL 101 03/03/2021   CREATININE 0.73 03/03/2021   BUN 9 03/03/2021    CO2 24 03/03/2021   TSH 1.320 10/27/2021   INR 1.0 08/25/2012   HGBA1C 5.2 08/25/2012  DG Chest 2 View  Result Date: 11/06/2021 CLINICAL DATA:  Severe obesity. Gastric sleeve to be converted to gastric bypass. EXAM: CHEST - 2 VIEW COMPARISON:  Chest radiograph 08/25/2012 FINDINGS: The cardiomediastinal contours are normal. The lungs are clear. Pulmonary vasculature is normal. No consolidation, pleural effusion, or pneumothorax. No acute osseous abnormalities are seen. IMPRESSION: Negative radiographs of the chest. Electronically Signed   By: Narda Rutherford M.D.   On: 11/06/2021 21:39   DG UGI W SINGLE CM (SOL OR THIN BA)  Result Date: 11/05/2021 CLINICAL DATA:  History of gastric sleeve surgery 2014. Request for single contrast medium upper GI for evaluation for revision to gastric bypass. EXAM: DG UGI W SINGLE CM TECHNIQUE: Scout radiograph was obtained. Single contrast examination was performed using thin liquid barium. This exam was performed by Alex Gardener, NP, and was supervised and interpreted by Genevive Bi, MD. FLUOROSCOPY: Radiation Exposure Index (as provided by the fluoroscopic device): 46.60 mGy Kerma COMPARISON:  None Available. FINDINGS: Scout Radiograph: Normal bowel-gas pattern Esophagus:  Normal appearance. Esophageal motility:  Within normal limits. Gastroesophageal reflux: None visualized with provocation maneuvers. Ingested 36mm barium tablet:  Not given Stomach: Normal appearance.  Small hiatal hernia. Gastric emptying: Normal. Duodenum:  Normal appearance. Other:  None. IMPRESSION: Small hiatal hernia. No gastroesophageal reflux noted. Normal appearance of esophagus, stomach and duodenum. Read by: Alex Gardener, AGNP-BC Electronically Signed   By: Genevive Bi M.D.   On: 11/05/2021 11:58       Assessment & Plan:   Problem List Items Addressed This Visit     GERD (gastroesophageal reflux disease)    Has had EGD previously 10/2012 - possible mild reflux esophagitis  at GE junction.  Small erosion vs trauma.  With increased acid reflux recently.  Taking 6 TUMS daily.  Had UGI - small hiatal hernia.  protonix helping.        Relevant Medications   pantoprazole (PROTONIX) 40 MG tablet   Hypothyroidism    On synthroid.  Follow tsh.        Joint pain    Saw rheumatology.  Follow.       Pseudotumor cerebri    No headaches currently.  Did well previously with weight loss.  Discussed importance of weight loss.  Follow.         Dale Laurinburg, MD

## 2022-01-16 ENCOUNTER — Encounter: Payer: Self-pay | Admitting: Internal Medicine

## 2022-01-16 NOTE — Assessment & Plan Note (Signed)
No headaches currently.  Did well previously with weight loss.  Discussed importance of weight loss.  Follow.  

## 2022-01-16 NOTE — Assessment & Plan Note (Signed)
Has had EGD previously 10/2012 - possible mild reflux esophagitis at GE junction.  Small erosion vs trauma.  With increased acid reflux recently.  Taking 6 TUMS daily.  Had UGI - small hiatal hernia.  protonix helping.

## 2022-01-16 NOTE — Assessment & Plan Note (Signed)
Saw rheumatology.  Follow.

## 2022-01-16 NOTE — Assessment & Plan Note (Signed)
On synthroid.  Follow tsh.   

## 2022-03-29 ENCOUNTER — Telehealth: Payer: Self-pay

## 2022-03-29 NOTE — Telephone Encounter (Signed)
Patient states she changed her insurance and they changed her pharmacy to [facility].  Patient states she would like to [facility] [place] on [address].  Patient states she can also do a mail order.  Patient states she does need a  refill for her levothyroxine (SYNTHROID) 125 MCG tablet she would like to have it refilled by mail order.

## 2022-03-30 ENCOUNTER — Other Ambulatory Visit: Payer: Self-pay

## 2022-03-30 ENCOUNTER — Telehealth: Payer: Self-pay

## 2022-03-30 MED ORDER — LEVOTHYROXINE SODIUM 125 MCG PO TABS: 125.0000 ug | ORAL_TABLET | Freq: Every day | ORAL | 0 refills | Status: DC

## 2022-03-30 MED ORDER — LEVOTHYROXINE SODIUM 125 MCG PO TABS: 125.0000 ug | ORAL_TABLET | Freq: Every day | ORAL | 3 refills | Status: DC

## 2022-03-30 MED ORDER — LEVOTHYROXINE SODIUM 125 MCG PO TABS: 125.0000 ug | ORAL_TABLET | Freq: Every day | ORAL | 2 refills | Status: DC

## 2022-03-30 NOTE — Telephone Encounter (Signed)
Patient called stating she can not use the generic brand of levothyroxine (SYNTHROID) 125 MCG tablet. Please call in the name brand for her.

## 2022-03-30 NOTE — Telephone Encounter (Signed)
[place] [facility] SUPPLY W/ REFILLS SENT TO [facility]

## 2022-03-30 NOTE — Telephone Encounter (Signed)
DAW sent in

## 2022-04-13 ENCOUNTER — Telehealth: Payer: Self-pay

## 2022-04-13 DIAGNOSIS — E039 Hypothyroidism, unspecified: Secondary | ICD-10-CM

## 2022-04-13 DIAGNOSIS — D649 Anemia, unspecified: Secondary | ICD-10-CM

## 2022-04-13 DIAGNOSIS — Z13220 Encounter for screening for lipoid disorders: Secondary | ICD-10-CM

## 2022-04-13 NOTE — Telephone Encounter (Signed)
I called patient to reschedule appointment with Dr. Einar Pheasant from 05/17/2022 to 05/25/2022.  Patient states she would like to have her labs drawn before appointment.  I see lab orders in patient's chart dated 10/25/2021, and they are marked "Future", but these same labs were drawn on 10/27/2021.  I'm not sure if the orders dated 10/25/2021 are all patient will need for her physical.

## 2022-04-14 NOTE — Telephone Encounter (Signed)
LMTCB

## 2022-04-14 NOTE — Telephone Encounter (Signed)
I have ordered labs.  Please schedule lab appt as she requested.  Will be fasting lab

## 2022-04-14 NOTE — Telephone Encounter (Signed)
Will you review her lab orders and tell me if you want routine fasting labs on her or if the 3 future that are ordered from 10/2021 are all you need. Per chart review, it looks like it has been awhile since we did fasting labs but wanted to double check with you prior to ordering.

## 2022-04-20 NOTE — Telephone Encounter (Signed)
Labs reordered for [facility]. Patient is aware to have fasting labs drawn within a week of her appt with DR Nicki Reaper

## 2022-04-30 ENCOUNTER — Other Ambulatory Visit: Payer: Self-pay | Admitting: Internal Medicine

## 2022-04-30 ENCOUNTER — Telehealth: Payer: Self-pay | Admitting: Internal Medicine

## 2022-04-30 ENCOUNTER — Other Ambulatory Visit: Payer: Self-pay

## 2022-04-30 MED ORDER — LEVOTHYROXINE SODIUM 125 MCG PO TABS: 125.0000 ug | ORAL_TABLET | Freq: Every day | ORAL | 3 refills | Status: DC

## 2022-04-30 NOTE — Telephone Encounter (Signed)
Prescription sent in for brand name synthroid only. Pt aware.

## 2022-04-30 NOTE — Telephone Encounter (Signed)
LMTCB

## 2022-04-30 NOTE — Telephone Encounter (Signed)
Patient called and her pharmacy told her that when office calls in her levothyroxine (SYNTHROID) 125 MCG tablet it must say Synthroid.

## 2022-05-17 ENCOUNTER — Encounter: Payer: BC Managed Care – PPO | Admitting: Internal Medicine

## 2022-05-21 DIAGNOSIS — D649 Anemia, unspecified: Secondary | ICD-10-CM | POA: Diagnosis not present

## 2022-05-21 DIAGNOSIS — Z13220 Encounter for screening for lipoid disorders: Secondary | ICD-10-CM | POA: Diagnosis not present

## 2022-05-21 DIAGNOSIS — E039 Hypothyroidism, unspecified: Secondary | ICD-10-CM | POA: Diagnosis not present

## 2022-05-22 LAB — CBC WITH DIFFERENTIAL/PLATELET
Basophils Absolute: 0.1 10*3/uL (ref 0.0–0.2)
Basos: 1 %
EOS (ABSOLUTE): 0.4 10*3/uL (ref 0.0–0.4)
Eos: 6 %
Hematocrit: 39.1 % (ref 34.0–46.6)
Hemoglobin: 12.4 g/dL (ref 11.1–15.9)
Immature Grans (Abs): 0 10*3/uL (ref 0.0–0.1)
Immature Granulocytes: 0 %
Lymphocytes Absolute: 1.6 10*3/uL (ref 0.7–3.1)
Lymphs: 24 %
MCH: 27.4 pg (ref 26.6–33.0)
MCHC: 31.7 g/dL (ref 31.5–35.7)
MCV: 86 fL (ref 79–97)
Monocytes Absolute: 0.4 10*3/uL (ref 0.1–0.9)
Monocytes: 6 %
Neutrophils Absolute: 4.2 10*3/uL (ref 1.4–7.0)
Neutrophils: 63 %
Platelets: 251 10*3/uL (ref 150–450)
RBC: 4.53 x10E6/uL (ref 3.77–5.28)
RDW: 13.2 % (ref 11.7–15.4)
WBC: 6.7 10*3/uL (ref 3.4–10.8)

## 2022-05-22 LAB — COMPREHENSIVE METABOLIC PANEL
ALT: 13 IU/L (ref 0–32)
AST: 17 IU/L (ref 0–40)
Albumin/Globulin Ratio: 1.6 (ref 1.2–2.2)
Albumin: 4.4 g/dL (ref 3.9–4.9)
Alkaline Phosphatase: 82 IU/L (ref 44–121)
BUN/Creatinine Ratio: 12 (ref 9–23)
BUN: 10 mg/dL (ref 6–20)
Bilirubin Total: 0.8 mg/dL (ref 0.0–1.2)
CO2: 23 mmol/L (ref 20–29)
Calcium: 9.4 mg/dL (ref 8.7–10.2)
Chloride: 103 mmol/L (ref 96–106)
Creatinine, Ser: 0.81 mg/dL (ref 0.57–1.00)
Globulin, Total: 2.7 g/dL (ref 1.5–4.5)
Glucose: 64 mg/dL — ABNORMAL LOW (ref 70–99)
Potassium: 5.1 mmol/L (ref 3.5–5.2)
Sodium: 139 mmol/L (ref 134–144)
Total Protein: 7.1 g/dL (ref 6.0–8.5)
eGFR: 96 mL/min/{1.73_m2} (ref >59)

## 2022-05-22 LAB — LIPID PANEL
Chol/HDL Ratio: 2.3 ratio (ref 0.0–4.4)
Cholesterol, Total: 166 mg/dL (ref 100–199)
HDL: 73 mg/dL (ref >39)
LDL Chol Calc (NIH): 80 mg/dL (ref 0–99)
Triglycerides: 66 mg/dL (ref 0–149)
VLDL Cholesterol Cal: 13 mg/dL (ref 5–40)

## 2022-05-22 LAB — TSH: TSH: 2.04 u[IU]/mL (ref 0.450–4.500)

## 2022-05-25 ENCOUNTER — Encounter: Payer: Self-pay | Admitting: Internal Medicine

## 2022-05-25 ENCOUNTER — Ambulatory Visit (INDEPENDENT_AMBULATORY_CARE_PROVIDER_SITE_OTHER): Payer: BC Managed Care – PPO | Admitting: Internal Medicine

## 2022-05-25 VITALS — BP 122/80 | HR 65 | Temp 98.3°F | Resp 16 | Ht 65.0 in | Wt 283.4 lb

## 2022-05-25 DIAGNOSIS — D649 Anemia, unspecified: Secondary | ICD-10-CM

## 2022-05-25 DIAGNOSIS — E039 Hypothyroidism, unspecified: Secondary | ICD-10-CM | POA: Diagnosis not present

## 2022-05-25 DIAGNOSIS — Z0000 Encounter for general adult medical examination without abnormal findings: Secondary | ICD-10-CM | POA: Diagnosis not present

## 2022-05-25 DIAGNOSIS — G932 Benign intracranial hypertension: Secondary | ICD-10-CM

## 2022-05-25 DIAGNOSIS — K219 Gastro-esophageal reflux disease without esophagitis: Secondary | ICD-10-CM

## 2022-05-25 DIAGNOSIS — R197 Diarrhea, unspecified: Secondary | ICD-10-CM

## 2022-05-25 NOTE — Assessment & Plan Note (Signed)
Physical today 05/25/22.  Sees gyn for pap smear.

## 2022-05-25 NOTE — Progress Notes (Signed)
Subjective:    Patient ID: Anna Vasquez, female    DOB: Jun 11, 1985, 37 y.o.   MRN: [id number]  Patient here for  Chief Complaint  Patient presents with   Annual Exam    HPI Here for physical. Recent evaluation for possible bariatric surgery. Has been evaluated by surgery. Recent UGI - small hiatal hernia.  Has been taking protonix for GERD.  When stops - has immediate return of symptoms.  Discussed restarting and f/u wth GI for question of need for EGD.  Had two episodes recently (Christmas Day and one month later) - fever, chills, vomiting and diarrhea (bloody diarrhea - both episodes).  Each lasted 24 hours.  No vomiting, nausea or diarrhea since.  No abdominal pain.  Breathing stable.  No chest pain or sob reported.  Periods regular. On synthroid.  Plans to schedule f/u with gyn for pap and breast exam.  Discussed family history - paternal aunt - breast cancer, paternal uncle - esophageal cancer, skin cancer in family.     Past Medical History:  Diagnosis Date   Frequent headaches    GERD (gastroesophageal reflux disease)    H/O febrile seizure    Hx of migraines    Hypothyroidism    Hypothyroidism    PCOD (polycystic ovarian disease)    Pseudotumor cerebri    Thoracic outlet syndrome    extra cervical ribs   Vitamin D deficiency    Past Surgical History:  Procedure Laterality Date   CARPAL TUNNEL RELEASE     rt and left   LAPAROSCOPIC GASTRIC SLEEVE RESECTION     MOUTH SURGERY  1998   Family History  Problem Relation Age of Onset   Arthritis Father    Hypertension Father    Sleep apnea Father    Transient ischemic attack Father    COPD Father    Rheum arthritis Mother    Breast cancer Paternal Aunt    Mental illness Paternal Uncle    Esophageal cancer Paternal Uncle    Prostate cancer Paternal Grandfather    Alzheimer's disease Maternal Grandmother    Skin cancer Paternal Grandmother    Social History   Socioeconomic History   Marital status: Married     Spouse name: Not on file   Number of children: Not on file   Years of education: Not on file   Highest education level: Not on file  Occupational History   Not on file  Tobacco Use   Smoking status: Never   Smokeless tobacco: Never  Vaping Use   Vaping Use: Never used  Substance and Sexual Activity   Alcohol use: Yes    Alcohol/week: 0.0 standard drinks of alcohol   Drug use: No   Sexual activity: Yes  Other Topics Concern   Not on file  Social History Narrative   Not on file   Social Determinants of Health   Financial Resource Strain: Low Risk  (02/23/2019)   Overall Financial Resource Strain (CARDIA)    Difficulty of Paying Living Expenses: Not hard at all  Food Insecurity: No Food Insecurity (02/23/2019)   Hunger Vital Sign    Worried About Running Out of Food in the Last Year: Never true    Ran Out of Food in the Last Year: Never true  Transportation Needs: Unknown (02/23/2019)   PRAPARE - Hydrologist (Medical): No    Lack of Transportation (Non-Medical): Not on file  Physical Activity: Not on file  Stress: No  Stress Concern Present (02/23/2019)   Olds    Feeling of Stress : Only a little  Social Connections: Not on file     Review of Systems  Constitutional:  Negative for appetite change and unexpected weight change.  HENT:  Negative for congestion, sinus pressure and sore throat.   Eyes:  Negative for pain and visual disturbance.  Respiratory:  Negative for cough, chest tightness and shortness of breath.   Cardiovascular:  Negative for chest pain, palpitations and leg swelling.  Gastrointestinal:  Negative for abdominal pain, diarrhea, nausea and vomiting.  Genitourinary:  Negative for difficulty urinating and dysuria.  Musculoskeletal:  Negative for joint swelling and myalgias.  Skin:  Negative for color change and rash.  Neurological:  Negative for dizziness and  headaches.  Hematological:  Negative for adenopathy. Does not bruise/bleed easily.  Psychiatric/Behavioral:  Negative for agitation and dysphoric mood.        Objective:     BP 122/80   Pulse 65   Temp 98.3 F (36.8 C)   Resp 16   Ht 5\' 5"  (1.651 m)   Wt 283 lb 6.4 oz (128.5 kg)   SpO2 98%   BMI 47.16 kg/m  Wt Readings from Last 3 Encounters:  05/25/22 283 lb 6.4 oz (128.5 kg)  01/14/22 288 lb 6.4 oz (130.8 kg)  10/26/21 289 lb 8 oz (131.3 kg)    Physical Exam Vitals reviewed.  Constitutional:      General: She is not in acute distress.    Appearance: Normal appearance.  HENT:     Head: Normocephalic and atraumatic.     Right Ear: External ear normal.     Left Ear: External ear normal.  Eyes:     General: No scleral icterus.       Right eye: No discharge.        Left eye: No discharge.     Conjunctiva/sclera: Conjunctivae normal.  Neck:     Thyroid: No thyromegaly.  Cardiovascular:     Rate and Rhythm: Normal rate and regular rhythm.  Pulmonary:     Effort: No respiratory distress.     Breath sounds: Normal breath sounds. No wheezing.  Abdominal:     General: Bowel sounds are normal.     Palpations: Abdomen is soft.     Tenderness: There is no abdominal tenderness.  Musculoskeletal:        General: No swelling or tenderness.     Cervical back: Neck supple. No tenderness.  Lymphadenopathy:     Cervical: No cervical adenopathy.  Skin:    Findings: No erythema or rash.  Neurological:     Mental Status: She is alert.  Psychiatric:        Mood and Affect: Mood normal.        Behavior: Behavior normal.      Outpatient Encounter Medications as of 05/25/2022  Medication Sig   levothyroxine (SYNTHROID) 125 MCG tablet Take 1 tablet (125 mcg total) by mouth daily.   No facility-administered encounter medications on file as of 05/25/2022.     Lab Results  Component Value Date   WBC 6.7 05/21/2022   HGB 12.4 05/21/2022   HCT 39.1 05/21/2022   PLT 251  05/21/2022   GLUCOSE 64 (L) 05/21/2022   CHOL 166 05/21/2022   TRIG 66 05/21/2022   HDL 73 05/21/2022   LDLCALC 80 05/21/2022   ALT 13 05/21/2022   AST 17 05/21/2022  NA 139 05/21/2022   K 5.1 05/21/2022   CL 103 05/21/2022   CREATININE 0.81 05/21/2022   BUN 10 05/21/2022   CO2 23 05/21/2022   TSH 2.040 05/21/2022   INR 1.0 08/25/2012   HGBA1C 5.2 08/25/2012    DG Chest 2 View  Result Date: 11/06/2021 CLINICAL DATA:  Severe obesity. Gastric sleeve to be converted to gastric bypass. EXAM: CHEST - 2 VIEW COMPARISON:  Chest radiograph 08/25/2012 FINDINGS: The cardiomediastinal contours are normal. The lungs are clear. Pulmonary vasculature is normal. No consolidation, pleural effusion, or pneumothorax. No acute osseous abnormalities are seen. IMPRESSION: Negative radiographs of the chest. Electronically Signed   By: Keith Rake M.D.   On: 11/06/2021 21:39   DG UGI W SINGLE CM (SOL OR THIN BA)  Result Date: 11/05/2021 CLINICAL DATA:  History of gastric sleeve surgery 2014. Request for single contrast medium upper GI for evaluation for revision to gastric bypass. EXAM: DG UGI W SINGLE CM TECHNIQUE: Scout radiograph was obtained. Single contrast examination was performed using thin liquid barium. This exam was performed by Narda Rutherford, NP, and was supervised and interpreted by Suzy Bouchard, MD. FLUOROSCOPY: Radiation Exposure Index (as provided by the fluoroscopic device): 46.60 mGy Kerma COMPARISON:  None Available. FINDINGS: Scout Radiograph: Normal bowel-gas pattern Esophagus:  Normal appearance. Esophageal motility:  Within normal limits. Gastroesophageal reflux: None visualized with provocation maneuvers. Ingested 57mm barium tablet:  Not given Stomach: Normal appearance.  Small hiatal hernia. Gastric emptying: Normal. Duodenum:  Normal appearance. Other:  None. IMPRESSION: Small hiatal hernia. No gastroesophageal reflux noted. Normal appearance of esophagus, stomach and duodenum.  Read by: Narda Rutherford, AGNP-BC Electronically Signed   By: Suzy Bouchard M.D.   On: 11/05/2021 11:58       Assessment & Plan:  Routine general medical examination at a health care facility  Health care maintenance Assessment & Plan: Physical today 05/25/22.  Sees gyn for pap smear.     Anemia, unspecified type Assessment & Plan: Follow cbc.    Gastroesophageal reflux disease, unspecified whether esophagitis present Assessment & Plan: Has had EGD previously 10/2012 - possible mild reflux esophagitis at GE junction.  Small erosion vs trauma.  With increased acid reflux recently.  Taking multiple TUMS.    Recent evaluation for possible bariatric surgery. Has been evaluated by surgery. Recent UGI - small hiatal hernia.  Has been taking protonix for GERD.  When stops - has immediate return of symptoms.  Discussed restarting protonix daily and f/u wth GI for question of need for EGD.  Orders: -     Ambulatory referral to Gastroenterology  Hypothyroidism, unspecified type Assessment & Plan: On synthroid.  Follow tsh.     Pseudotumor cerebri Assessment & Plan: No headaches currently.  Did well previously with weight loss.  Discussed importance of weight loss.  Follow.    Severe obesity (BMI >= 40) (HCC) Assessment & Plan: Discussed diet and exercise.  Discussed treatment options.  Weight down from previous visit. Follow.    Diarrhea, unspecified type Assessment & Plan: Had two episodes recently (Christmas Day and one month later) - fever, chills, vomiting and diarrhea (bloody diarrhea - both episodes).  Each lasted 24 hours.  No vomiting, nausea or diarrhea since.  No abdominal pain.  Having the issues with reflux as outlined. Start protonix.  No lower GI symptoms currently.  Refer to GI as outlined.    Orders: -     Ambulatory referral to Gastroenterology     Einar Pheasant,  MD

## 2022-05-30 ENCOUNTER — Encounter: Payer: Self-pay | Admitting: Internal Medicine

## 2022-05-30 NOTE — Assessment & Plan Note (Signed)
On synthroid.  Follow tsh.   

## 2022-05-30 NOTE — Assessment & Plan Note (Signed)
No headaches currently.  Did well previously with weight loss.  Discussed importance of weight loss.  Follow.

## 2022-05-30 NOTE — Assessment & Plan Note (Signed)
Had two episodes recently (Christmas Day and one month later) - fever, chills, vomiting and diarrhea (bloody diarrhea - both episodes).  Each lasted 24 hours.  No vomiting, nausea or diarrhea since.  No abdominal pain.  Having the issues with reflux as outlined. Start protonix.  No lower GI symptoms currently.  Refer to GI as outlined.

## 2022-05-30 NOTE — Assessment & Plan Note (Signed)
Follow cbc.  

## 2022-05-30 NOTE — Assessment & Plan Note (Signed)
Has had EGD previously 10/2012 - possible mild reflux esophagitis at GE junction.  Small erosion vs trauma.  With increased acid reflux recently.  Taking multiple TUMS.    Recent evaluation for possible bariatric surgery. Has been evaluated by surgery. Recent UGI - small hiatal hernia.  Has been taking protonix for GERD.  When stops - has immediate return of symptoms.  Discussed restarting protonix daily and f/u wth GI for question of need for EGD.

## 2022-05-30 NOTE — Assessment & Plan Note (Addendum)
Discussed diet and exercise.  Discussed treatment options.  Weight down from previous visit. Follow.

## 2022-07-16 NOTE — Progress Notes (Unsigned)
07/19/2022 Anna Vasquez [id number] 1985/08/07  Referring provider: [name], MD Primary GI doctor: Dr. [name]  ASSESSMENT AND PLAN:   37 year old female with history of gastric sleeve 2014, GERD presents with continuing GERD despite PPI therapy, and wishing to have revision of gastric sleeve with Dr. [name] 2014 EGD with ulcerations at GE junction, negative for dysplasia Lifestyle changes discussed, avoid NSAIDS, ETOH Continue pantoprazole once daily Weight loss discussed Will schedule upper endoscopy with Dr. [name] at [facility] to evaluate for hiatal hernia, esophagitis, gastritis, H. pylori. Consider right upper quadrant ultrasound if EGD negative  Patient's had 3 episodes of rectal bleeding associated with diarrhea Patient has hypothyroidism/autoimmune issues, has joint pain and had negative rheumatological workup last year other than slightly elevated CRP. Will schedule for colonoscopy with endoscopy at [facility] to evaluate for autoimmune disease/Crohn's/ulcerative colitis/malignancy Check for celiac, CRP sed rate  I recommend upper gastrointestinal and colorectal evaluation with an EGD and colonoscopy.  Risk of bowel prep, conscious sedation, and EGD and colonoscopy were discussed.  Risks include but are not limited to dehydration, pain, bleeding, cardiopulmonary process, bowel perforation, or other possible adverse outcomes..  Treatment plan was discussed with patient, and agreed upon.    Patient Care Team: Dale Liberty, MD as PCP - General (Internal Medicine)  HISTORY OF PRESENT ILLNESS: 37 y.o. female with a past medical history of PCOS, pseudotumor cerebri, hypothyroidism, migraines, GERD with history of gastric sleeve 2014 and others listed below presents for evaluation of GERD.   10/2012 EGD mild reflux esophagitis at GE junction small erosion versus trauma 11/05/2021 upper GI series requested for evaluation of revision of gastric bypass with Dr. [name] showed  small hiatal hernia no reflux normal esophagus, stomach duodenum Labs reviewed from 05/21/2022 Hgb 12.4 no anemia normal liver and kidney.  2014 gastric sleeve, did very well with it and then she has had her 2 kids, daughter is 4.5 and son is 2.5.  she has nausea/vomiting during both pregnancies and had worsening GERD the last 2 years.  She was eating tums all the time and had constant GERD. She wanted to do the gastric sleeve revision but was denied.  She has been on pantoprazole 40 mg once a day for a month and has been on it for the last 3 months. When she takes it, the symptoms are little better, still has break through and has to take tums.  As soon as she misses a dose, she has to take it again.  She has dysphagia if it is severe GERD, can feel swollen.  She has GB, mom s/p cholecystectomy and hiatal hernia repair.  Then Christmas day she had 2 episodes of severe epigastric burning, associated with nausea, vomiting, fever.  About a month a part, thought it was food poison but then recently she had another episode of 4 days of diarrhea.  Mild lower AB cramping, no rectal pain.  She states during this time she had BRB large volume blood with the diarrhea.  She normally has BM every other day, no straining, formed stools.  No weight loss.  She does have lower back and ankle pain, worse in the morning for 2 hours. No rashes.  Patient denies family history of colon cancer or other gastrointestinal malignancies.  Father is a carrier for alpha 1 antitrypsin  She denies blood thinner use.  She denies NSAID use.  She denies ETOH use.   She denies tobacco use.  She denies drug use.    She  reports that she has never smoked. She has never used smokeless tobacco. She reports current alcohol use. She reports that she does not use drugs.  RELEVANT LABS AND IMAGING: CBC    Component Value Date/Time   WBC 6.7 05/21/2022 0905   WBC 7.2 02/28/2020 0951   RBC 4.53 05/21/2022 0905   RBC 4.33  02/28/2020 0951   HGB 12.4 05/21/2022 0905   HCT 39.1 05/21/2022 0905   PLT 251 05/21/2022 0905   MCV 86 05/21/2022 0905   MCV 90 07/06/2014 1041   MCH 27.4 05/21/2022 0905   MCH 29.6 02/05/2020 0500   MCHC 31.7 05/21/2022 0905   MCHC 32.3 02/28/2020 0951   RDW 13.2 05/21/2022 0905   RDW 14.3 07/06/2014 1041   LYMPHSABS 1.6 05/21/2022 0905   LYMPHSABS 1.3 07/06/2014 1041   MONOABS 0.4 02/28/2020 0951   MONOABS 0.5 07/06/2014 1041   EOSABS 0.4 05/21/2022 0905   EOSABS 0.1 07/06/2014 1041   BASOSABS 0.1 05/21/2022 0905   BASOSABS 0.0 07/06/2014 1041   Recent Labs    10/27/21 1325 05/21/22 0905  HGB 13.1 12.4    CMP     Component Value Date/Time   NA 139 05/21/2022 0905   NA 139 07/06/2014 1041   K 5.1 05/21/2022 0905   K 3.8 07/06/2014 1041   CL 103 05/21/2022 0905   CL 106 07/06/2014 1041   CO2 23 05/21/2022 0905   CO2 27 07/06/2014 1041   GLUCOSE 64 (L) 05/21/2022 0905   GLUCOSE 88 02/07/2017 1118   GLUCOSE 70 07/06/2014 1041   BUN 10 05/21/2022 0905   BUN 13 07/06/2014 1041   CREATININE 0.81 05/21/2022 0905   CREATININE 0.74 07/06/2014 1041   CALCIUM 9.4 05/21/2022 0905   CALCIUM 9.0 07/06/2014 1041   PROT 7.1 05/21/2022 0905   PROT 8.0 05/12/2013 1304   ALBUMIN 4.4 05/21/2022 0905   ALBUMIN 3.9 05/12/2013 1304   AST 17 05/21/2022 0905   AST 27 05/12/2013 1304   ALT 13 05/21/2022 0905   ALT 23 05/12/2013 1304   ALKPHOS 82 05/21/2022 0905   ALKPHOS 78 05/12/2013 1304   BILITOT 0.8 05/21/2022 0905   BILITOT 1.6 (H) 05/12/2013 1304   GFRNONAA >60 05/07/2015 1241   GFRNONAA >60 07/06/2014 1041   GFRAA >60 05/07/2015 1241   GFRAA >60 07/06/2014 1041      Latest Ref Rng & Units 05/21/2022    9:05 AM 04/27/2021    9:48 AM 03/03/2021    8:48 AM  Hepatic Function  Total Protein 6.0 - 8.5 g/dL 7.1  6.5  6.6   Albumin 3.9 - 4.9 g/dL 4.4  4.2  4.4   AST 0 - 40 IU/L 17  14  17    ALT 0 - 32 IU/L 13  11  12    Alk Phosphatase 44 - 121 IU/L 82  74  75   Total  Bilirubin 0.0 - 1.2 mg/dL 0.8  0.8  1.3   Bilirubin, Direct 0.00 - 0.40 mg/dL  1.61        Current Medications:   Current Outpatient Medications (Endocrine & Metabolic):    levothyroxine (SYNTHROID) 125 MCG tablet, Take 1 tablet (125 mcg total) by mouth daily.      Current Outpatient Medications (Other):    Na Sulfate-K Sulfate-Mg Sulf 17.5-3.13-1.6 GM/177ML SOLN, Take 1 kit by mouth once for 1 dose.   pantoprazole (PROTONIX) 40 MG tablet, Take 40 mg by mouth daily.   Medical History:  Past Medical  History:  Diagnosis Date   Frequent headaches    GERD (gastroesophageal reflux disease)    H/O febrile seizure    Hx of migraines    Hypothyroidism    Hypothyroidism    PCOD (polycystic ovarian disease)    Pseudotumor cerebri    Thoracic outlet syndrome    extra cervical ribs   Vitamin D deficiency    Allergies:  Allergies  Allergen Reactions   Tetracyclines & Related Other (See Comments)    Has had a Pseudotumor cerebri, was advised to list this medication as contraindicated      Surgical History:  She  has a past surgical history that includes Mouth surgery (1998); Laparoscopic gastric sleeve resection; and Carpal tunnel release. Family History:  Her family history includes Alzheimer's disease in her maternal grandmother; Arthritis in her father; Breast cancer in her paternal aunt; COPD in her father; Esophageal cancer in her paternal uncle; Hypertension in her father; Mental illness in her paternal uncle; Prostate cancer in her paternal grandfather; Rheum arthritis in her mother; Skin cancer in her paternal grandmother; Sleep apnea in her father; Transient ischemic attack in her father.  REVIEW OF SYSTEMS  : All other systems reviewed and negative except where noted in the History of Present Illness.  PHYSICAL EXAM: BP 118/88   Pulse 77   Ht 5\' 5"  (1.651 m)   Wt 289 lb (131.1 kg)   SpO2 99%   BMI 48.09 kg/m  General Appearance: Well nourished, in no apparent  distress. Head:   Normocephalic and atraumatic. Eyes:  sclerae anicteric,conjunctive pink  Respiratory: Respiratory effort normal, BS equal bilaterally without rales, rhonchi, wheezing. Cardio: RRR with no MRGs. Peripheral pulses intact.  Abdomen: Soft,  Obese ,active bowel sounds. No tenderness . Without guarding and Without rebound. No masses. Rectal: Not evaluated Musculoskeletal: Full ROM, Normal gait. Without edema. Skin:  Dry and intact without significant lesions or rashes Neuro: Alert and  oriented x4;  No focal deficits. Psych:  Cooperative. Normal mood and affect.    Doree Albee, PA-C 10:40 AM

## 2022-07-19 ENCOUNTER — Ambulatory Visit (INDEPENDENT_AMBULATORY_CARE_PROVIDER_SITE_OTHER): Payer: BC Managed Care – PPO | Admitting: Physician Assistant

## 2022-07-19 ENCOUNTER — Encounter: Payer: Self-pay | Admitting: Physician Assistant

## 2022-07-19 ENCOUNTER — Encounter: Payer: Self-pay | Admitting: Internal Medicine

## 2022-07-19 ENCOUNTER — Telehealth: Payer: Self-pay | Admitting: Internal Medicine

## 2022-07-19 ENCOUNTER — Other Ambulatory Visit (INDEPENDENT_AMBULATORY_CARE_PROVIDER_SITE_OTHER): Payer: BC Managed Care – PPO

## 2022-07-19 VITALS — BP 118/88 | HR 77 | Ht 65.0 in | Wt 289.0 lb

## 2022-07-19 DIAGNOSIS — K625 Hemorrhage of anus and rectum: Secondary | ICD-10-CM

## 2022-07-19 DIAGNOSIS — K219 Gastro-esophageal reflux disease without esophagitis: Secondary | ICD-10-CM

## 2022-07-19 DIAGNOSIS — R197 Diarrhea, unspecified: Secondary | ICD-10-CM

## 2022-07-19 LAB — COMPREHENSIVE METABOLIC PANEL
ALT: 13 U/L (ref 0–35)
AST: 19 U/L (ref 0–37)
Albumin: 4.2 g/dL (ref 3.5–5.2)
Alkaline Phosphatase: 60 U/L (ref 39–117)
BUN: 10 mg/dL (ref 6–23)
CO2: 27 mEq/L (ref 19–32)
Calcium: 9.1 mg/dL (ref 8.4–10.5)
Chloride: 104 mEq/L (ref 96–112)
Creatinine, Ser: 0.72 mg/dL (ref 0.40–1.20)
GFR: 106.7 mL/min (ref >60.00)
Glucose, Bld: 89 mg/dL (ref 70–99)
Potassium: 4.2 mEq/L (ref 3.5–5.1)
Sodium: 140 mEq/L (ref 135–145)
Total Bilirubin: 0.8 mg/dL (ref 0.2–1.2)
Total Protein: 7.2 g/dL (ref 6.0–8.3)

## 2022-07-19 LAB — CBC WITH DIFFERENTIAL/PLATELET
Basophils Absolute: 0.1 10*3/uL (ref 0.0–0.1)
Basophils Relative: 1.1 % (ref 0.0–3.0)
Eosinophils Absolute: 0.3 10*3/uL (ref 0.0–0.7)
Eosinophils Relative: 5.3 % — ABNORMAL HIGH (ref 0.0–5.0)
HCT: 38.4 % (ref 36.0–46.0)
Hemoglobin: 12.3 g/dL (ref 12.0–15.0)
Lymphocytes Relative: 30.2 % (ref 12.0–46.0)
Lymphs Abs: 1.9 10*3/uL (ref 0.7–4.0)
MCHC: 32.1 g/dL (ref 30.0–36.0)
MCV: 84.7 fl (ref 78.0–100.0)
Monocytes Absolute: 0.5 10*3/uL (ref 0.1–1.0)
Monocytes Relative: 7.7 % (ref 3.0–12.0)
Neutro Abs: 3.6 10*3/uL (ref 1.4–7.7)
Neutrophils Relative %: 55.7 % (ref 43.0–77.0)
Platelets: 255 10*3/uL (ref 150.0–400.0)
RBC: 4.53 Mil/uL (ref 3.87–5.11)
RDW: 14.4 % (ref 11.5–15.5)
WBC: 6.4 10*3/uL (ref 4.0–10.5)

## 2022-07-19 LAB — SEDIMENTATION RATE: Sed Rate: 36 mm/hr — ABNORMAL HIGH (ref 0–20)

## 2022-07-19 LAB — HIGH SENSITIVITY CRP: CRP, High Sensitivity: 4.24 mg/L (ref 0.000–5.000)

## 2022-07-19 LAB — TSH: TSH: 2.06 u[IU]/mL (ref 0.35–5.50)

## 2022-07-19 MED ORDER — NA SULFATE-K SULFATE-MG SULF 17.5-3.13-1.6 GM/177ML PO SOLN: 1.0000 | Freq: Once | ORAL | 0 refills | Status: AC

## 2022-07-19 NOTE — Telephone Encounter (Signed)
Not prescribed by you previously but taking regularly. Ok to send in?

## 2022-07-19 NOTE — Telephone Encounter (Signed)
If she has been taking protonix daily and doing well, then ok to refill.

## 2022-07-19 NOTE — Telephone Encounter (Signed)
Pt need refill on pantoprazole sent to [facility]

## 2022-07-19 NOTE — Patient Instructions (Addendum)
Your provider has requested that you go to the basement level for lab work before leaving today. Press "B" on the elevator. The lab is located at the first door on the left as you exit the elevator.   You have been scheduled for a colonoscopy and EGD. Please follow written instructions given to you at your visit today.  Please pick up your prep supplies at the pharmacy within the next 1-3 days. If you use inhalers (even only as needed), please bring them with you on the day of your procedure.  _______________________________________________________  If your blood pressure at your visit was 140/90 or greater, please contact your primary care physician to follow up on this.  _______________________________________________________  If you are age 43 or older, your body mass index should be between 23-30. Your Body mass index is 48.09 kg/m. If this is out of the aforementioned range listed, please consider follow up with your Primary Care Provider.  If you are age 46 or younger, your body mass index should be between 19-25. Your Body mass index is 48.09 kg/m. If this is out of the aformentioned range listed, please consider follow up with your Primary Care Provider.   ________________________________________________________  The [facility] providers would like to encourage you to use [facility] to communicate with providers for non-urgent requests or questions.  Due to long hold times on the telephone, sending your provider a message by [facility] may be a faster and more efficient way to get a response.  Please allow 48 business hours for a response.  Please remember that this is for non-urgent requests.  _______________________________________________________ It was a pleasure to see you today!  Thank you for trusting me with your gastrointestinal care!

## 2022-07-20 ENCOUNTER — Other Ambulatory Visit: Payer: Self-pay

## 2022-07-20 LAB — TISSUE TRANSGLUTAMINASE, IGA: (tTG) Ab, IgA: <1 U/mL

## 2022-07-20 LAB — IGA: Immunoglobulin A: 280 mg/dL (ref 47–310)

## 2022-07-20 MED ORDER — PANTOPRAZOLE SODIUM 40 MG PO TBEC: 40.0000 mg | DELAYED_RELEASE_TABLET | Freq: Every day | ORAL | 3 refills | Status: DC

## 2022-07-20 NOTE — Telephone Encounter (Signed)
Medication refilled. Left message to let patient know.

## 2022-07-22 NOTE — Progress Notes (Signed)
Agree with assessment/plan.  Raj Markeia Harkless, MD [facility] [phone]  

## 2022-08-12 DIAGNOSIS — J028 Acute pharyngitis due to other specified organisms: Secondary | ICD-10-CM | POA: Diagnosis not present

## 2022-09-20 ENCOUNTER — Encounter: Payer: Self-pay | Admitting: Gastroenterology

## 2022-09-20 ENCOUNTER — Ambulatory Visit: Payer: BC Managed Care – PPO | Admitting: Gastroenterology

## 2022-09-20 VITALS — BP 125/68 | HR 63 | Temp 97.3°F | Resp 16 | Ht 65.0 in | Wt 289.0 lb

## 2022-09-20 DIAGNOSIS — K219 Gastro-esophageal reflux disease without esophagitis: Secondary | ICD-10-CM

## 2022-09-20 DIAGNOSIS — R197 Diarrhea, unspecified: Secondary | ICD-10-CM

## 2022-09-20 DIAGNOSIS — K229 Disease of esophagus, unspecified: Secondary | ICD-10-CM | POA: Diagnosis not present

## 2022-09-20 DIAGNOSIS — K200 Eosinophilic esophagitis: Secondary | ICD-10-CM

## 2022-09-20 DIAGNOSIS — K222 Esophageal obstruction: Secondary | ICD-10-CM

## 2022-09-20 DIAGNOSIS — K625 Hemorrhage of anus and rectum: Secondary | ICD-10-CM

## 2022-09-20 MED ORDER — SODIUM CHLORIDE 0.9 % IV SOLN
500.0000 mL | Freq: Once | INTRAVENOUS | Status: DC
Start: 2022-09-20 — End: 2022-09-20

## 2022-09-20 NOTE — Op Note (Signed)
[facility] Patient Name: Anna Vasquez Procedure Date: 09/20/2022 12:46 PM MRN: [id number] Endoscopist: Lynann Bologna , MD, [id number] Age: 37 Referring MD:  Date of Birth: 05-10-1985 Gender: Female Account #: [id number] Procedure:                Upper GI endoscopy Indications:              Dysphagia, Heartburn Medicines:                Monitored Anesthesia Care Procedure:                Pre-Anesthesia Assessment:                           - Prior to the procedure, a History and Physical                            was performed, and patient medications and                            allergies were reviewed. The patient's tolerance of                            previous anesthesia was also reviewed. The risks                            and benefits of the procedure and the sedation                            options and risks were discussed with the patient.                            All questions were answered, and informed consent                            was obtained. Prior Anticoagulants: The patient has                            taken no anticoagulant or antiplatelet agents. ASA                            Grade Assessment: II - A patient with mild systemic                            disease. After reviewing the risks and benefits,                            the patient was deemed in satisfactory condition to                            undergo the procedure.                           After obtaining informed consent, the endoscope was  passed under direct vision. Throughout the                            procedure, the patient's blood pressure, pulse, and                            oxygen saturations were monitored continuously. The                            GIF [id number] #[id number] was introduced through the                            mouth, and advanced to the second part of duodenum.                            The upper GI endoscopy was  accomplished without                            difficulty. The patient tolerated the procedure                            well. Scope In: Scope Out: Findings:                 Few linear furrows were noted in the proximal and                            midesophagus. Multiple biopsies were obtained to                            rule out eosinophilic esophagitis. LA Grade B (one                            or more mucosal breaks greater than 5 mm, not                            extending between the tops of two mucosal folds)                            esophagitis with no bleeding was found 37 to 38 cm                            from the incisors. Biopsies were taken with a cold                            forceps for histology.                           One benign-appearing, intrinsic mild                            (non-circumferential scarring) stenosis was found  38 cm from the incisors at the GE junction. This                            stenosis measured 1.4 cm (inner diameter). The                            stenosis was traversed. The scope was withdrawn.                            Dilation was performed with a Maloney dilator with                            mild resistance at 50 Fr.                           A small hiatal hernia was present.                           Evidence of a sleeve gastrectomy was found in the                            stomach. This was characterized by healthy                            appearing mucosa.                           Localized mild inflammation characterized by                            erythema was found in the gastric antrum. Biopsies                            were taken with a cold forceps for histology.                           The examined duodenum was normal. Biopsies for                            histology were taken with a cold forceps for                            evaluation of celiac  disease. Complications:            No immediate complications. Estimated Blood Loss:     Estimated blood loss: none. Impression:               - LA Grade B reflux esophagitis with no bleeding.                            Biopsied.                           - Benign-appearing esophageal stenosis. Dilated.                           -  Small hiatal hernia.                           - A sleeve gastrectomy was found, characterized by                            healthy appearing mucosa.                           - Gastritis. Biopsied.                           - Normal examined duodenum. Biopsied. Recommendation:           - Patient has a contact number available for                            emergencies. The signs and symptoms of potential                            delayed complications were discussed with the                            patient. Return to normal activities tomorrow.                            Written discharge instructions were provided to the                            patient.                           - Increase Protonix 40 mg p.o. twice daily x 8                            weeks, then once a day.                           - Brochures regarding reflux.                           - Avoid nonsteroidals.                           - Await pathology results.                           - FU If still with problems. Lynann Bologna, MD 09/20/2022 2:08:02 PM This report has been signed electronically.

## 2022-09-20 NOTE — Progress Notes (Signed)
VS completed by DT.  Pt's states no medical or surgical changes since previsit or office visit.  

## 2022-09-20 NOTE — Progress Notes (Signed)
Called to room to assist during endoscopic procedure.  Patient ID and intended procedure confirmed with present staff. Received instructions for my participation in the procedure from the performing physician.  

## 2022-09-20 NOTE — Progress Notes (Signed)
Report to PACU, RN, vss, BBS= Clear.  

## 2022-09-20 NOTE — Patient Instructions (Addendum)
Await pathology results. Repeat colonoscopy in 10 years for screening purposes. Earlier, if with any new problems or change in family history. Use HC Cream 2.5%: Apply externally twice daily as needed Increase Protonix 40 mg p.o. twice daily x 8 weeks, then once a day. Brochures regarding reflux. Avoid nonsteroidals.- like Advil, Motrin, Ibuprofen, Naproxen.  Tylenol is ok to take FU If still with problems. Follow dilation diet- see handout  YOU HAD AN ENDOSCOPIC PROCEDURE TODAY AT THE [facility]:   Refer to the procedure report that was given to you for any specific questions about what was found during the examination.  If the procedure report does not answer your questions, please call your gastroenterologist to clarify.  If you requested that your care partner not be given the details of your procedure findings, then the procedure report has been included in a sealed envelope for you to review at your convenience later.  YOU SHOULD EXPECT: Some feelings of bloating in the abdomen. Passage of more gas than usual.  Walking can help get rid of the air that was put into your GI tract during the procedure and reduce the bloating. If you had a lower endoscopy (such as a colonoscopy or flexible sigmoidoscopy) you may notice spotting of blood in your stool or on the toilet paper. If you underwent a bowel prep for your procedure, you may not have a normal bowel movement for a few days.  Please Note:  You might notice some irritation and congestion in your nose or some drainage.  This is from the oxygen used during your procedure.  There is no need for concern and it should clear up in a day or so.  SYMPTOMS TO REPORT IMMEDIATELY:  Following lower endoscopy (colonoscopy or flexible sigmoidoscopy):  Excessive amounts of blood in the stool  Significant tenderness or worsening of abdominal pains  Swelling of the abdomen that is new, acute  Fever of 100F or higher  Following upper  endoscopy (EGD)  Vomiting of blood or coffee ground material  New chest pain or pain under the shoulder blades  Painful or persistently difficult swallowing  New shortness of breath  Fever of 100F or higher  Black, tarry-looking stools  For urgent or emergent issues, a gastroenterologist can be reached at any hour by calling [phone]. Do not use MyChart messaging for urgent concerns.    DIET:  Follow dilation diet today!  Drink plenty of fluids but you should avoid alcoholic beverages for 24 hours.  ACTIVITY:  You should plan to take it easy for the rest of today and you should NOT DRIVE or use heavy machinery until tomorrow (because of the sedation medicines used during the test).    FOLLOW UP: Our staff will call the number listed on your records the next business day following your procedure.  We will call around 7:15- 8:00 am to check on you and address any questions or concerns that you may have regarding the information given to you following your procedure. If we do not reach you, we will leave a message.     If any biopsies were taken you will be contacted by phone or by letter within the next 1-3 weeks.  Please call us at [phone] if you have not heard about the biopsies in 3 weeks.    SIGNATURES/CONFIDENTIALITY: You and/or your care partner have signed paperwork which will be entered into your electronic medical record.  These signatures attest to the fact that that the  information above on your After Visit Summary has been reviewed and is understood.  Full responsibility of the confidentiality of this discharge information lies with you and/or your care-partner.

## 2022-09-20 NOTE — Progress Notes (Signed)
Expand All Collapse All         07/19/2022 Anna Vasquez [id number] May 15, 1985   Referring provider: Dale Lamont, MD Primary GI doctor: Dr. [name]   ASSESSMENT AND PLAN:    37 year old female with history of gastric sleeve 2014, GERD presents with continuing GERD despite PPI therapy, and wishing to have revision of gastric sleeve with Dr. [name] 2014 EGD with ulcerations at GE junction, negative for dysplasia Lifestyle changes discussed, avoid NSAIDS, ETOH Continue pantoprazole once daily Weight loss discussed Will schedule upper endoscopy with Dr. [name] at [facility] to evaluate for hiatal hernia, esophagitis, gastritis, H. pylori. Consider right upper quadrant ultrasound if EGD negative   Patient's had 3 episodes of rectal bleeding associated with diarrhea Patient has hypothyroidism/autoimmune issues, has joint pain and had negative rheumatological workup last year other than slightly elevated CRP. Will schedule for colonoscopy with endoscopy at [facility] to evaluate for autoimmune disease/Crohn's/ulcerative colitis/malignancy Check for celiac, CRP sed rate   I recommend upper gastrointestinal and colorectal evaluation with an EGD and colonoscopy.  Risk of bowel prep, conscious sedation, and EGD and colonoscopy were discussed.  Risks include but are not limited to dehydration, pain, bleeding, cardiopulmonary process, bowel perforation, or other possible adverse outcomes..  Treatment plan was discussed with patient, and agreed upon.       Patient Care Team: Dale [place], MD as PCP - General (Internal Medicine)   HISTORY OF PRESENT ILLNESS: 37 y.o. female with a past medical history of PCOS, pseudotumor cerebri, hypothyroidism, migraines, GERD with history of gastric sleeve 2014 and others listed below presents for evaluation of GERD.    10/2012 EGD mild reflux esophagitis at GE junction small erosion versus trauma 11/05/2021 upper GI series requested for evaluation of revision of  gastric bypass with Dr. [name] showed small hiatal hernia no reflux normal esophagus, stomach duodenum Labs reviewed from 05/21/2022 Hgb 12.4 no anemia normal liver and kidney.   2014 gastric sleeve, did very well with it and then she has had her 2 kids, daughter is 4.5 and son is 2.5.  she has nausea/vomiting during both pregnancies and had worsening GERD the last 2 years.  She was eating tums all the time and had constant GERD. She wanted to do the gastric sleeve revision but was denied.  She has been on pantoprazole 40 mg once a day for a month and has been on it for the last 3 months. When she takes it, the symptoms are little better, still has break through and has to take tums.  As soon as she misses a dose, she has to take it again.  She has dysphagia if it is severe GERD, can feel swollen.  She has GB, mom s/p cholecystectomy and hiatal hernia repair.   Then Christmas day she had 2 episodes of severe epigastric burning, associated with nausea, vomiting, fever.  About a month a part, thought it was food poison but then recently she had another episode of 4 days of diarrhea.  Mild lower AB cramping, no rectal pain.  She states during this time she had BRB large volume blood with the diarrhea.  She normally has BM every other day, no straining, formed stools.  No weight loss.  She does have lower back and ankle pain, worse in the morning for 2 hours. No rashes.  Patient denies family history of colon cancer or other gastrointestinal malignancies.  Father is a carrier for alpha 1 antitrypsin   She denies blood thinner use.  She denies  NSAID use.  She denies ETOH use.   She denies tobacco use.  She denies drug use.     She  reports that she has never smoked. She has never used smokeless tobacco. She reports current alcohol use. She reports that she does not use drugs.   RELEVANT LABS AND IMAGING: CBC Labs (Brief)          Component Value Date/Time    WBC 6.7 05/21/2022 0905    WBC  7.2 02/28/2020 0951    RBC 4.53 05/21/2022 0905    RBC 4.33 02/28/2020 0951    HGB 12.4 05/21/2022 0905    HCT 39.1 05/21/2022 0905    PLT 251 05/21/2022 0905    MCV 86 05/21/2022 0905    MCV 90 07/06/2014 1041    MCH 27.4 05/21/2022 0905    MCH 29.6 02/05/2020 0500    MCHC 31.7 05/21/2022 0905    MCHC 32.3 02/28/2020 0951    RDW 13.2 05/21/2022 0905    RDW 14.3 07/06/2014 1041    LYMPHSABS 1.6 05/21/2022 0905    LYMPHSABS 1.3 07/06/2014 1041    MONOABS 0.4 02/28/2020 0951    MONOABS 0.5 07/06/2014 1041    EOSABS 0.4 05/21/2022 0905    EOSABS 0.1 07/06/2014 1041    BASOSABS 0.1 05/21/2022 0905    BASOSABS 0.0 07/06/2014 1041      Recent Labs (within last 365 days)      Recent Labs    10/27/21 1325 05/21/22 0905  HGB 13.1 12.4        CMP     Labs (Brief)          Component Value Date/Time    NA 139 05/21/2022 0905    NA 139 07/06/2014 1041    K 5.1 05/21/2022 0905    K 3.8 07/06/2014 1041    CL 103 05/21/2022 0905    CL 106 07/06/2014 1041    CO2 23 05/21/2022 0905    CO2 27 07/06/2014 1041    GLUCOSE 64 (L) 05/21/2022 0905    GLUCOSE 88 02/07/2017 1118    GLUCOSE 70 07/06/2014 1041    BUN 10 05/21/2022 0905    BUN 13 07/06/2014 1041    CREATININE 0.81 05/21/2022 0905    CREATININE 0.74 07/06/2014 1041    CALCIUM 9.4 05/21/2022 0905    CALCIUM 9.0 07/06/2014 1041    PROT 7.1 05/21/2022 0905    PROT 8.0 05/12/2013 1304    ALBUMIN 4.4 05/21/2022 0905    ALBUMIN 3.9 05/12/2013 1304    AST 17 05/21/2022 0905    AST 27 05/12/2013 1304    ALT 13 05/21/2022 0905    ALT 23 05/12/2013 1304    ALKPHOS 82 05/21/2022 0905    ALKPHOS 78 05/12/2013 1304    BILITOT 0.8 05/21/2022 0905    BILITOT 1.6 (H) 05/12/2013 1304    GFRNONAA >60 05/07/2015 1241    GFRNONAA >60 07/06/2014 1041    GFRAA >60 05/07/2015 1241    GFRAA >60 07/06/2014 1041          Latest Ref Rng & Units 05/21/2022    9:05 AM 04/27/2021    9:48 AM 03/03/2021    8:48 AM  Hepatic Function   Total Protein 6.0 - 8.5 g/dL 7.1  6.5  6.6   Albumin 3.9 - 4.9 g/dL 4.4  4.2  4.4   AST 0 - 40 IU/L 17  14  17    ALT 0 - 32 IU/L 13  11  12   Alk Phosphatase 44 - 121 IU/L 82  74  75   Total Bilirubin 0.0 - 1.2 mg/dL 0.8  0.8  1.3   Bilirubin, Direct 0.00 - 0.40 mg/dL   2.95         Current Medications:    Current Outpatient Medications (Endocrine & Metabolic):    levothyroxine (SYNTHROID) 125 MCG tablet, Take 1 tablet (125 mcg total) by mouth daily.           Current Outpatient Medications (Other):    Na Sulfate-K Sulfate-Mg Sulf 17.5-3.13-1.6 GM/177ML SOLN, Take 1 kit by mouth once for 1 dose.   pantoprazole (PROTONIX) 40 MG tablet, Take 40 mg by mouth daily.     Medical History:      Past Medical History:  Diagnosis Date   Frequent headaches     GERD (gastroesophageal reflux disease)     H/O febrile seizure     Hx of migraines     Hypothyroidism     Hypothyroidism     PCOD (polycystic ovarian disease)     Pseudotumor cerebri     Thoracic outlet syndrome      extra cervical ribs   Vitamin D deficiency      Allergies:       Allergies  Allergen Reactions   Tetracyclines & Related Other (See Comments)      Has had a Pseudotumor cerebri, was advised to list this medication as contraindicated       Surgical History:  She  has a past surgical history that includes Mouth surgery (1998); Laparoscopic gastric sleeve resection; and Carpal tunnel release. Family History:  Her family history includes Alzheimer's disease in her maternal grandmother; Arthritis in her father; Breast cancer in her paternal aunt; COPD in her father; Esophageal cancer in her paternal uncle; Hypertension in her father; Mental illness in her paternal uncle; Prostate cancer in her paternal grandfather; Rheum arthritis in her mother; Skin cancer in her paternal grandmother; Sleep apnea in her father; Transient ischemic attack in her father.   REVIEW OF SYSTEMS  : All other systems reviewed and  negative except where noted in the History of Present Illness.   PHYSICAL EXAM: BP 118/88   Pulse 77   Ht 5\' 5"  (1.651 m)   Wt 289 lb (131.1 kg)   SpO2 99%   BMI 48.09 kg/m  General Appearance: Well nourished, in no apparent distress. Head:   Normocephalic and atraumatic. Eyes:  sclerae anicteric,conjunctive pink  Respiratory: Respiratory effort normal, BS equal bilaterally without rales, rhonchi, wheezing. Cardio: RRR with no MRGs. Peripheral pulses intact.  Abdomen: Soft,  Obese ,active bowel sounds. No tenderness . Without guarding and Without rebound. No masses. Rectal: Not evaluated Musculoskeletal: Full ROM, Normal gait. Without edema. Skin:  Dry and intact without significant lesions or rashes Neuro: Alert and  oriented x4;  No focal deficits. Psych:  Cooperative. Normal mood and affect.      Doree Albee, PA-C 10:40 AM     Attending physician's note   I have taken history, reviewed the chart and examined the patient. I performed a substantive portion of this encounter, including complete performance of at least one of the key components, in conjunction with the APP. I agree with the Advanced Practitioner's note, impression and recommendations.    Edman Circle, MD [facility] [phone]

## 2022-09-20 NOTE — Op Note (Signed)
[facility] Patient Name: Anna Vasquez Procedure Date: 09/20/2022 12:46 PM MRN: [id number] Endoscopist: Lynann Bologna , MD, [id number] Age: 37 Referring MD:  Date of Birth: 08/16/1985 Gender: Female Account #: [id number] Procedure:                Colonoscopy Indications:              Clinically significant diarrhea of unexplained                            origin. Few episodes of rectal bleeding. Medicines:                Monitored Anesthesia Care Procedure:                Pre-Anesthesia Assessment:                           - Prior to the procedure, a History and Physical                            was performed, and patient medications and                            allergies were reviewed. The patient's tolerance of                            previous anesthesia was also reviewed. The risks                            and benefits of the procedure and the sedation                            options and risks were discussed with the patient.                            All questions were answered, and informed consent                            was obtained. Prior Anticoagulants: The patient has                            taken no anticoagulant or antiplatelet agents. ASA                            Grade Assessment: II - A patient with mild systemic                            disease. After reviewing the risks and benefits,                            the patient was deemed in satisfactory condition to                            undergo the procedure.  After obtaining informed consent, the colonoscope                            was passed under direct vision. Throughout the                            procedure, the patient's blood pressure, pulse, and                            oxygen saturations were monitored continuously. The                            Olympus Scope [id number] was introduced through the                            anus and advanced  to the 2 cm into the ileum. The                            colonoscopy was performed without difficulty. The                            patient tolerated the procedure well. The quality                            of the bowel preparation was good. The terminal                            ileum, ileocecal valve, appendiceal orifice, and                            rectum were photographed. Scope In: 1:49:25 PM Scope Out: 2:00:32 PM Scope Withdrawal Time: 0 hours 7 minutes 18 seconds  Total Procedure Duration: 0 hours 11 minutes 7 seconds  Findings:                 The colon (entire examined portion) appeared                            normal. Biopsies for histology were taken with a                            cold forceps for evaluation of microscopic colitis.                           Non-bleeding internal hemorrhoids were found during                            retroflexion. The hemorrhoids were small and Grade                            I (internal hemorrhoids that do not prolapse).                           The terminal ileum appeared normal.  The exam was otherwise without abnormality on                            direct and retroflexion views. Complications:            No immediate complications. Estimated Blood Loss:     Estimated blood loss: none. Impression:               - The entire examined colon is normal. Biopsied.                           - Non-bleeding internal hemorrhoids.                           - The examined portion of the ileum was normal.                           - The examination was otherwise normal on direct                            and retroflexion views. Recommendation:           - Patient has a contact number available for                            emergencies. The signs and symptoms of potential                            delayed complications were discussed with the                            patient. Return to normal activities  tomorrow.                            Written discharge instructions were provided to the                            patient.                           - Resume previous diet.                           - Continue present medications.                           - Await pathology results.                           - Repeat colonoscopy in 10 years for screening                            purposes. Earlier, if with any new problems or                            change in family history.                           -  Use HC Cream 2.5%: Apply externally BID PRN.                           - The findings and recommendations were discussed                            with the patient's family. Lynann Bologna, MD 09/20/2022 2:10:41 PM This report has been signed electronically.

## 2022-09-21 ENCOUNTER — Telehealth: Payer: Self-pay

## 2022-09-21 NOTE — Telephone Encounter (Signed)
Follow up call placed, VM obtained and message left. 

## 2022-09-29 DIAGNOSIS — Z01419 Encounter for gynecological examination (general) (routine) without abnormal findings: Secondary | ICD-10-CM | POA: Diagnosis not present

## 2022-09-29 DIAGNOSIS — Z1151 Encounter for screening for human papillomavirus (HPV): Secondary | ICD-10-CM | POA: Diagnosis not present

## 2022-09-29 LAB — HM PAP SMEAR: HM Pap smear: NEGATIVE

## 2022-10-01 ENCOUNTER — Encounter: Payer: Self-pay | Admitting: Gastroenterology

## 2022-10-01 NOTE — Progress Notes (Signed)
Please inform the patient. Esophageal biopsies consistent with EOE.  Start Flovent inhalers (181mcg/inh) twice daily for 1 month, then daily for 2 months or Advair 250/50 inhalations twice daily for 1 month and then once a day for 1 month or any steroid inhaler covered by insurance.  Must swallow.  Do not eat or drink or rinse for 20-30 minutes thereafter.  Then can rinse with warm water.  Food allergy testing from Dr. Kathyrn Lass or any Allergist's office.  Continue PPIs as per EGD note.   Send report to family physician

## 2022-10-04 ENCOUNTER — Telehealth: Payer: Self-pay | Admitting: Gastroenterology

## 2022-10-04 NOTE — Telephone Encounter (Signed)
 Patient is returning your call.  

## 2022-10-05 ENCOUNTER — Other Ambulatory Visit: Payer: Self-pay

## 2022-10-05 DIAGNOSIS — K200 Eosinophilic esophagitis: Secondary | ICD-10-CM

## 2022-10-05 MED ORDER — FLUTICASONE PROPIONATE HFA 110 MCG/ACT IN AERO
INHALATION_SPRAY | RESPIRATORY_TRACT | 3 refills | Status: DC
Start: 2022-10-05 — End: 2023-04-12

## 2022-10-05 NOTE — Telephone Encounter (Signed)
Spoke with pt. Documented under result notes:  

## 2022-10-08 ENCOUNTER — Telehealth: Payer: Self-pay | Admitting: Pharmacy Technician

## 2022-10-08 ENCOUNTER — Other Ambulatory Visit (HOSPITAL_COMMUNITY): Payer: Self-pay

## 2022-10-08 NOTE — Telephone Encounter (Signed)
Pharmacy Patient Advocate Encounter   Received notification from CoverMyMeds that prior authorization for FLUTICASONE is required/requested.   Insurance verification completed.   The patient is insured through [facility] .   Per test claim: PA submitted to [facility] via CoverMyMeds Key/confirmation #/EOC [id number] Status is pending

## 2022-10-11 ENCOUNTER — Other Ambulatory Visit (HOSPITAL_COMMUNITY): Payer: Self-pay

## 2022-10-12 ENCOUNTER — Other Ambulatory Visit (HOSPITAL_COMMUNITY): Payer: Self-pay

## 2022-10-12 NOTE — Telephone Encounter (Signed)
Pharmacy Patient Advocate Encounter  Received notification from [facility] that Prior Authorization for Fluticasone Propionate HFA 110MCG/ACT aerosol has been APPROVED from 10-09-2022 to 10-09-2023. Ran test claim, Copay is $n/a Filled 10-05-2022 and next fill available 10-28-2022.  PA #/Case ID/Reference #: [id number]

## 2022-10-13 NOTE — Telephone Encounter (Signed)
Pt was made aware that PA has been approved.  Pt verbalized understanding with all questions answered.

## 2022-10-25 ENCOUNTER — Encounter: Payer: Self-pay | Admitting: Internal Medicine

## 2022-10-25 ENCOUNTER — Ambulatory Visit (INDEPENDENT_AMBULATORY_CARE_PROVIDER_SITE_OTHER): Payer: BC Managed Care – PPO | Admitting: Internal Medicine

## 2022-10-25 VITALS — BP 120/78 | HR 60 | Temp 97.7°F | Ht 65.0 in | Wt 287.0 lb

## 2022-10-25 DIAGNOSIS — M722 Plantar fascial fibromatosis: Secondary | ICD-10-CM | POA: Diagnosis not present

## 2022-10-25 DIAGNOSIS — K219 Gastro-esophageal reflux disease without esophagitis: Secondary | ICD-10-CM

## 2022-10-25 DIAGNOSIS — D649 Anemia, unspecified: Secondary | ICD-10-CM

## 2022-10-25 DIAGNOSIS — K200 Eosinophilic esophagitis: Secondary | ICD-10-CM

## 2022-10-25 DIAGNOSIS — E039 Hypothyroidism, unspecified: Secondary | ICD-10-CM

## 2022-10-25 DIAGNOSIS — R5383 Other fatigue: Secondary | ICD-10-CM | POA: Diagnosis not present

## 2022-10-25 DIAGNOSIS — M545 Low back pain, unspecified: Secondary | ICD-10-CM

## 2022-10-25 LAB — BASIC METABOLIC PANEL
BUN: 11 mg/dL (ref 6–23)
CO2: 25 mEq/L (ref 19–32)
Calcium: 8.6 mg/dL (ref 8.4–10.5)
Chloride: 105 mEq/L (ref 96–112)
Creatinine, Ser: 0.72 mg/dL (ref 0.40–1.20)
GFR: 106.5 mL/min (ref >60.00)
Glucose, Bld: 86 mg/dL (ref 70–99)
Potassium: 4.4 mEq/L (ref 3.5–5.1)
Sodium: 138 mEq/L (ref 135–145)

## 2022-10-25 LAB — CBC WITH DIFFERENTIAL/PLATELET
Basophils Absolute: 0 10*3/uL (ref 0.0–0.1)
Basophils Relative: 0.4 % (ref 0.0–3.0)
Eosinophils Absolute: 0.1 10*3/uL (ref 0.0–0.7)
Eosinophils Relative: 1.8 % (ref 0.0–5.0)
HCT: 36.6 % (ref 36.0–46.0)
Hemoglobin: 11.5 g/dL — ABNORMAL LOW (ref 12.0–15.0)
Lymphocytes Relative: 21.1 % (ref 12.0–46.0)
Lymphs Abs: 1.6 10*3/uL (ref 0.7–4.0)
MCHC: 31.4 g/dL (ref 30.0–36.0)
MCV: 83.5 fl (ref 78.0–100.0)
Monocytes Absolute: 0.5 10*3/uL (ref 0.1–1.0)
Monocytes Relative: 6.5 % (ref 3.0–12.0)
Neutro Abs: 5.3 10*3/uL (ref 1.4–7.7)
Neutrophils Relative %: 70.2 % (ref 43.0–77.0)
Platelets: 226 10*3/uL (ref 150.0–400.0)
RBC: 4.39 Mil/uL (ref 3.87–5.11)
RDW: 14.9 % (ref 11.5–15.5)
WBC: 7.5 10*3/uL (ref 4.0–10.5)

## 2022-10-25 LAB — VITAMIN B12: Vitamin B-12: 175 pg/mL — ABNORMAL LOW (ref 211–911)

## 2022-10-25 LAB — TSH: TSH: 1.09 u[IU]/mL (ref 0.35–5.50)

## 2022-10-25 LAB — VITAMIN D 25 HYDROXY (VIT D DEFICIENCY, FRACTURES): VITD: 28.03 ng/mL — ABNORMAL LOW (ref 30.00–100.00)

## 2022-10-25 MED ORDER — EPINEPHRINE 0.3 MG/0.3ML IJ SOAJ: 0.3000 mg | INTRAMUSCULAR | 0 refills | Status: DC | PRN

## 2022-10-25 NOTE — Assessment & Plan Note (Signed)
On synthroid.  Follow tsh.   

## 2022-10-25 NOTE — Assessment & Plan Note (Signed)
Feel is multifactorial.  Will check cbc, vitamin D, B12 and tsh.

## 2022-10-25 NOTE — Assessment & Plan Note (Signed)
Saw GI 07/19/22 - recommended EGD and colonoscopy - both performed 09/21/22. EGD - esophagitis, esophageal stenosis, gastritis and small hiatal hernia. Colonoscopy - non bleeding internal hemorrhoids otherwise normal. Biopsy c/w EOE. Recommended start flovent inhalers and referral to allergist.  Continue PPI. Taking bid and using inhalers.  Symptoms have improved.  No acid reflux.  Swallowing better.

## 2022-10-25 NOTE — Assessment & Plan Note (Signed)
Saw GI 07/19/22 - recommended EGD and colonoscopy - both performed 09/21/22. EGD - esophagitis, esophageal stenosis, gastritis and small hiatal hernia. Colonoscopy - non bleeding internal hemorrhoids otherwise normal. Biopsy c/w EOE. Recommended start flovent inhalers and referral to allergist.  Continue PPI. Taking bid and using inhalers.  Symptoms have improved.  No acid reflux.  Swallowing better. Discussed.  Has appt with allergist next month.

## 2022-10-25 NOTE — Assessment & Plan Note (Signed)
Low back pain - left lower back as outlined.  No radicular symptoms.  Discussed PT.

## 2022-10-25 NOTE — Assessment & Plan Note (Signed)
Follow cbc.  

## 2022-10-25 NOTE — Progress Notes (Signed)
Subjective:    Patient ID: Anna Vasquez, female    DOB: Nov 26, 1985, 37 y.o.   MRN: [id number]  Patient here for  Chief Complaint  Patient presents with   Medical Management of Chronic Issues    Discuss endoscopy results     HPI Here for a scheduled follow up.  Recent evaluation for possible bariatric surgery. Has been evaluated by surgery. UGI - small hiatal hernia.  Has been taking protonix for GERD.  When stopped - had immediate return of symptoms. Saw GI 07/19/22 - recommended EGD and colonoscopy - both performed 09/21/22. EGD - esophagitis, esophageal stenosis, gastritis and small hiatal hernia. Colonoscopy - non bleeding internal hemorrhoids otherwise normal. Biopsy c/w EOE. Recommended start flovent inhalers and referral to allergist.  Continue PPI. Taking bid and using inhalers.  Symptoms have improved.  No acid reflux.  Swallowing better. Saw gyn 09/29/22 - PAP - negative with negative HPV. Scheduled to see allergist - first week of September. Does report that ate spaghetti - triggered sensation change in throat.  Discussed allergies and treatment. GI symptoms are better.  Does report decreased energy.  Breathing stable.  Does report low back pain - specifically left low back.  No radicular symptoms.  Also reports right foot pain. Worse when first steps out of bed.  Also notices after up and walking around.     Past Medical History:  Diagnosis Date   Frequent headaches    GERD (gastroesophageal reflux disease)    H/O febrile seizure    Hx of migraines    Hypothyroidism    Hypothyroidism    PCOD (polycystic ovarian disease)    Pseudotumor cerebri    Thoracic outlet syndrome    extra cervical ribs   Vitamin D deficiency    Past Surgical History:  Procedure Laterality Date   CARPAL TUNNEL RELEASE     rt and left   LAPAROSCOPIC GASTRIC SLEEVE RESECTION     MOUTH SURGERY  1998   UPPER GASTROINTESTINAL ENDOSCOPY     Family History  Problem Relation Age of Onset   Rheum  arthritis Mother    Arthritis Father    Hypertension Father    Sleep apnea Father    Transient ischemic attack Father    COPD Father    Alzheimer's disease Maternal Grandmother    Skin cancer Paternal Grandmother    Prostate cancer Paternal Grandfather    Breast cancer Paternal Aunt    Mental illness Paternal Uncle    Esophageal cancer Paternal Uncle    Colon cancer Neg Hx    Stomach cancer Neg Hx    Social History   Socioeconomic History   Marital status: Married    Spouse name: Not on file   Number of children: Not on file   Years of education: Not on file   Highest education level: Not on file  Occupational History   Not on file  Tobacco Use   Smoking status: Never   Smokeless tobacco: Never  Vaping Use   Vaping status: Never Used  Substance and Sexual Activity   Alcohol use: Yes    Alcohol/week: 0.0 standard drinks of alcohol   Drug use: No   Sexual activity: Yes  Other Topics Concern   Not on file  Social History Narrative   Not on file   Social Determinants of Health   Financial Resource Strain: Low Risk  (02/23/2019)   Overall Financial Resource Strain (CARDIA)    Difficulty of Paying Living Expenses: Not  hard at all  Food Insecurity: No Food Insecurity (02/23/2019)   Hunger Vital Sign    Worried About Running Out of Food in the Last Year: Never true    Ran Out of Food in the Last Year: Never true  Transportation Needs: Unknown (02/23/2019)   PRAPARE - Administrator, Civil Service (Medical): No    Lack of Transportation (Non-Medical): Not on file  Physical Activity: Not on file  Stress: No Stress Concern Present (02/23/2019)   [employer] of Occupational Health - Occupational Stress Questionnaire    Feeling of Stress : Only a little  Social Connections: Not on file     Review of Systems  Constitutional:  Positive for fatigue. Negative for appetite change.  HENT:  Negative for congestion and sinus pressure.   Respiratory:  Negative  for cough, chest tightness and shortness of breath.   Cardiovascular:  Negative for chest pain, palpitations and leg swelling.  Gastrointestinal:  Negative for abdominal pain, diarrhea, nausea and vomiting.  Genitourinary:  Negative for difficulty urinating and dysuria.  Musculoskeletal:  Positive for back pain. Negative for myalgias.       Foot pain as outlined.   Skin:  Negative for color change and rash.  Neurological:  Negative for dizziness and syncope.  Psychiatric/Behavioral:  Negative for agitation and dysphoric mood.        Objective:     BP 120/78   Pulse 60   Temp 97.7 F (36.5 C) (Oral)   Ht 5\' 5"  (1.651 m)   Wt 287 lb (130.2 kg)   SpO2 99%   BMI 47.76 kg/m  Wt Readings from Last 3 Encounters:  10/25/22 287 lb (130.2 kg)  09/20/22 289 lb (131.1 kg)  07/19/22 289 lb (131.1 kg)    Physical Exam Vitals reviewed.  Constitutional:      General: She is not in acute distress.    Appearance: Normal appearance.  HENT:     Head: Normocephalic and atraumatic.     Right Ear: External ear normal.     Left Ear: External ear normal.  Eyes:     General: No scleral icterus.       Right eye: No discharge.        Left eye: No discharge.     Conjunctiva/sclera: Conjunctivae normal.  Neck:     Thyroid: No thyromegaly.  Cardiovascular:     Rate and Rhythm: Normal rate and regular rhythm.  Pulmonary:     Effort: No respiratory distress.     Breath sounds: Normal breath sounds. No wheezing.  Abdominal:     General: Bowel sounds are normal.     Palpations: Abdomen is soft.     Tenderness: There is no abdominal tenderness.  Musculoskeletal:        General: No swelling or tenderness.     Cervical back: Neck supple. No tenderness.  Lymphadenopathy:     Cervical: No cervical adenopathy.  Skin:    Findings: No erythema or rash.  Neurological:     Mental Status: She is alert.  Psychiatric:        Mood and Affect: Mood normal.        Behavior: Behavior normal.       Outpatient Encounter Medications as of 10/25/2022  Medication Sig   EPINEPHrine (EPIPEN 2-PAK) 0.3 mg/0.3 mL IJ SOAJ injection Inject 0.3 mg into the muscle as needed for anaphylaxis.   acetaminophen (TYLENOL) 325 MG tablet Take by mouth.   fluticasone (FLOVENT HFA)  110 MCG/ACT inhaler Use twice daily for 1 month then daily for 2 months. MUST SWALLOW, Do not eat or drink or rinse for 20 to 30 minutes thereafter. Then can rinse with warm water.   levothyroxine (SYNTHROID) 125 MCG tablet Take 1 tablet (125 mcg total) by mouth daily.   pantoprazole (PROTONIX) 40 MG tablet Take 1 tablet (40 mg total) by mouth daily. (Patient taking differently: Take 40 mg by mouth 2 (two) times daily.)   No facility-administered encounter medications on file as of 10/25/2022.     Lab Results  Component Value Date   WBC 7.5 10/25/2022   HGB 11.5 (L) 10/25/2022   HCT 36.6 10/25/2022   PLT 226.0 10/25/2022   GLUCOSE 86 10/25/2022   CHOL 166 05/21/2022   TRIG 66 05/21/2022   HDL 73 05/21/2022   LDLCALC 80 05/21/2022   ALT 13 07/19/2022   AST 19 07/19/2022   NA 138 10/25/2022   K 4.4 10/25/2022   CL 105 10/25/2022   CREATININE 0.72 10/25/2022   BUN 11 10/25/2022   CO2 25 10/25/2022   TSH 1.09 10/25/2022   INR 1.0 08/25/2012   HGBA1C 5.2 08/25/2012    DG Chest 2 View  Result Date: 11/06/2021 CLINICAL DATA:  Severe obesity. Gastric sleeve to be converted to gastric bypass. EXAM: CHEST - 2 VIEW COMPARISON:  Chest radiograph 08/25/2012 FINDINGS: The cardiomediastinal contours are normal. The lungs are clear. Pulmonary vasculature is normal. No consolidation, pleural effusion, or pneumothorax. No acute osseous abnormalities are seen. IMPRESSION: Negative radiographs of the chest. Electronically Signed   By: Narda Rutherford M.D.   On: 11/06/2021 21:39   DG UGI W SINGLE CM (SOL OR THIN BA)  Result Date: 11/05/2021 CLINICAL DATA:  History of gastric sleeve surgery 2014. Request for single contrast  medium upper GI for evaluation for revision to gastric bypass. EXAM: DG UGI W SINGLE CM TECHNIQUE: Scout radiograph was obtained. Single contrast examination was performed using thin liquid barium. This exam was performed by Alex Gardener, NP, and was supervised and interpreted by Genevive Bi, MD. FLUOROSCOPY: Radiation Exposure Index (as provided by the fluoroscopic device): 46.60 mGy Kerma COMPARISON:  None Available. FINDINGS: Scout Radiograph: Normal bowel-gas pattern Esophagus:  Normal appearance. Esophageal motility:  Within normal limits. Gastroesophageal reflux: None visualized with provocation maneuvers. Ingested 13mm barium tablet:  Not given Stomach: Normal appearance.  Small hiatal hernia. Gastric emptying: Normal. Duodenum:  Normal appearance. Other:  None. IMPRESSION: Small hiatal hernia. No gastroesophageal reflux noted. Normal appearance of esophagus, stomach and duodenum. Read by: Alex Gardener, AGNP-BC Electronically Signed   By: Genevive Bi M.D.   On: 11/05/2021 11:58       Assessment & Plan:  Other fatigue Assessment & Plan: Feel is multifactorial.  Will check cbc, vitamin D, B12 and tsh.   Orders: -     CBC with Differential/Platelet -     Basic metabolic panel -     TSH -     Vitamin B12 -     VITAMIN D 25 Hydroxy (Vit-D Deficiency, Fractures)  Eosinophilic esophagitis Assessment & Plan:  Saw GI 07/19/22 - recommended EGD and colonoscopy - both performed 09/21/22. EGD - esophagitis, esophageal stenosis, gastritis and small hiatal hernia. Colonoscopy - non bleeding internal hemorrhoids otherwise normal. Biopsy c/w EOE. Recommended start flovent inhalers and referral to allergist.  Continue PPI. Taking bid and using inhalers.  Symptoms have improved.  No acid reflux.  Swallowing better. Discussed.  Has appt with allergist next  month.    Orders: -     Vitamin B12 -     VITAMIN D 25 Hydroxy (Vit-D Deficiency, Fractures)  Plantar fasciitis of right foot -     Ambulatory  referral to Podiatry  Anemia, unspecified type Assessment & Plan: Follow cbc.    Gastroesophageal reflux disease, unspecified whether esophagitis present Assessment & Plan:  Saw GI 07/19/22 - recommended EGD and colonoscopy - both performed 09/21/22. EGD - esophagitis, esophageal stenosis, gastritis and small hiatal hernia. Colonoscopy - non bleeding internal hemorrhoids otherwise normal. Biopsy c/w EOE. Recommended start flovent inhalers and referral to allergist.  Continue PPI. Taking bid and using inhalers.  Symptoms have improved.  No acid reflux.  Swallowing better.   Hypothyroidism, unspecified type Assessment & Plan: On synthroid.  Follow tsh.     Midline low back pain without sciatica, unspecified chronicity Assessment & Plan: Low back pain - left lower back as outlined.  No radicular symptoms.  Discussed PT.   Orders: -     Ambulatory referral to Physical Therapy  Other orders -     EPINEPHrine; Inject 0.3 mg into the muscle as needed for anaphylaxis.  Dispense: 2 each; Refill: 0     Dale Hopewell, MD

## 2022-10-26 ENCOUNTER — Other Ambulatory Visit: Payer: Self-pay

## 2022-10-26 MED ORDER — CYANOCOBALAMIN 1000 MCG/ML IJ SOLN: INTRAMUSCULAR | 0 refills | Status: DC

## 2022-10-26 MED ORDER — "SYRINGE 25G X 1"" 3 ML MISC": 0 refills | Status: DC

## 2022-10-28 ENCOUNTER — Ambulatory Visit: Payer: BC Managed Care – PPO

## 2022-11-01 ENCOUNTER — Ambulatory Visit (INDEPENDENT_AMBULATORY_CARE_PROVIDER_SITE_OTHER): Payer: BC Managed Care – PPO

## 2022-11-01 DIAGNOSIS — E538 Deficiency of other specified B group vitamins: Secondary | ICD-10-CM

## 2022-11-01 MED ORDER — CYANOCOBALAMIN 1000 MCG/ML IJ SOLN
1000.0000 ug | Freq: Once | INTRAMUSCULAR | Status: AC
Start: 2022-11-01 — End: 2022-11-01
  Administered 2022-11-01: 1000 ug via INTRAMUSCULAR

## 2022-11-01 NOTE — Progress Notes (Signed)
Pt presented for their vitamin B12 injection. Pt was identified through two identifiers. Pt tolerated shot well in their right deltoid.  

## 2022-11-04 ENCOUNTER — Encounter: Payer: Self-pay | Admitting: Internal Medicine

## 2022-11-08 ENCOUNTER — Ambulatory Visit (INDEPENDENT_AMBULATORY_CARE_PROVIDER_SITE_OTHER): Payer: BC Managed Care – PPO

## 2022-11-08 ENCOUNTER — Ambulatory Visit (INDEPENDENT_AMBULATORY_CARE_PROVIDER_SITE_OTHER): Payer: BC Managed Care – PPO | Admitting: Podiatry

## 2022-11-08 ENCOUNTER — Encounter: Payer: Self-pay | Admitting: Internal Medicine

## 2022-11-08 ENCOUNTER — Other Ambulatory Visit: Payer: Self-pay | Admitting: Podiatry

## 2022-11-08 DIAGNOSIS — M79671 Pain in right foot: Secondary | ICD-10-CM

## 2022-11-08 DIAGNOSIS — M722 Plantar fascial fibromatosis: Secondary | ICD-10-CM

## 2022-11-08 NOTE — Patient Instructions (Signed)

## 2022-11-08 NOTE — Progress Notes (Signed)
  Subjective:  Patient ID: Anna Vasquez, female    DOB: 1985-11-04,  MRN: [id number]  Chief Complaint  Patient presents with   Foot Pain    Pt states that after giving birth in 2019 when she got home her right foot started to hurts and has been hurting ever since she says it has been getting worse and has gotten worse this summer    37 y.o. female presents with the above complaint.  Patient presents with complaint of pain in the right heel.  She says that has been hurting her for a long time.  Recently been getting worse.  Pain is usually worse in the morning after she gets out of bed.  She has been stretching and rolling her foot on a hard ball.  This helps a little bit.   Review of Systems: Negative except as noted in the HPI. Denies N/V/F/Ch.   Objective:  There were no vitals filed for this visit. There is no height or weight on file to calculate BMI. Constitutional Well developed. Well nourished.  Vascular Dorsalis pedis pulses palpable bilaterally. Posterior tibial pulses palpable bilaterally. Capillary refill normal to all digits.  No cyanosis or clubbing noted. Pedal hair growth normal.  Neurologic Normal speech. Oriented to person, place, and time. Epicritic sensation to light touch grossly present bilaterally.  Dermatologic Nails well groomed and normal in appearance. No open wounds. No skin lesions.  Orthopedic: Normal joint ROM without pain or crepitus bilaterally. No visible deformities. Tender to palpation at the calcaneal tuber right. No pain with calcaneal squeeze right. Ankle ROM diminished range of motion right. Silfverskiold Test: negative right.   Radiographs: Taken and reviewed. No acute fractures or dislocations. No evidence of stress fracture.  Plantar heel spur present. Posterior heel spur absent.   Assessment:   1. Plantar fasciitis, right   2. Right foot pain    Plan:  Patient was evaluated and treated and all questions answered.  Plantar  Fasciitis, right - XR reviewed as above.  - Educated on icing and stretching. Instructions given.  - Injection delivered to the plantar fascia as below. - DME: Night splint dispensed.  PowerStep orthotics dispensed - Pharmacologic management: Tylenol as needed for pain patient cannot take ibuprofen or NSAIDs  Procedure: Injection Tendon/Ligament Location: Right plantar fascia at the glabrous junction; medial approach. Skin Prep: alcohol Injectate: 1 cc 0.5% marcaine plain, 1 cc kenalog 10. Disposition: Patient tolerated procedure well. Injection site dressed with a band-aid.  Return in about 4 weeks (around 12/06/2022) for f/u R PF.

## 2022-11-12 ENCOUNTER — Ambulatory Visit: Payer: Self-pay | Admitting: Allergy

## 2022-11-23 ENCOUNTER — Encounter: Payer: Self-pay | Admitting: Allergy

## 2022-11-23 ENCOUNTER — Ambulatory Visit (INDEPENDENT_AMBULATORY_CARE_PROVIDER_SITE_OTHER): Payer: BC Managed Care – PPO | Admitting: Allergy

## 2022-11-23 VITALS — BP 118/76 | HR 66 | Resp 16 | Ht 66.0 in | Wt 288.8 lb

## 2022-11-23 DIAGNOSIS — T7800XA Anaphylactic reaction due to unspecified food, initial encounter: Secondary | ICD-10-CM | POA: Diagnosis not present

## 2022-11-23 DIAGNOSIS — K200 Eosinophilic esophagitis: Secondary | ICD-10-CM

## 2022-11-23 DIAGNOSIS — K9049 Malabsorption due to intolerance, not elsewhere classified: Secondary | ICD-10-CM | POA: Diagnosis not present

## 2022-11-23 NOTE — Patient Instructions (Addendum)
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    - Allergen avoidance measures (as above) provided.  - Continue Pantoprazole twice a day.  - Continue swallowed Flovent 1 puff twice a day.  Do not eat or drink for 30 minutes after use.   If symptoms increase then increase Flovent to 2 puffs twice a day.  If this does not improve symptoms let me know and will recommend other treatment options  - Discussed today other treatment options including Dupixent weekly injections for EOE management as well as Eohilia (swallowed budesonide in pre-made packet).   - Follow up with Dr. [name] as scheduled for monitoring.  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  Would also above banana and tomato as cause symptoms (may be intolerance).  Banana may be oral allergy syndrome (see below)  - Have access to self-injectable epinephrine (Epipen or AuviQ) 0.3mg  at all times  - Follow emergency action plan in case of allergic reaction  - We have discussed the following in regards to foods:   Allergy: food allergy is when you have eaten a food, developed an allergic reaction after eating the food and have IgE to the food (positive food testing either by skin testing or blood testing).  Food allergy could lead to life threatening symptoms  Sensitivity: occurs when you have IgE to a food (positive food testing either by skin testing or blood testing) but is a food you eat without any issues.  This is not an allergy and we recommend keeping the food in the diet  Intolerance: this is when you have negative testing by either skin testing or blood testing thus not allergic but the food causes symptoms (like belly pain, bloating, diarrhea etc) with ingestion.  These foods should be avoided to prevent symptoms.     - The oral  allergy syndrome (OAS) or pollen-food allergy syndrome (PFAS) is a relatively common form of food allergy, particularly in adults. It typically occurs in people who have pollen allergies when the immune system "sees" proteins on the food that look like proteins on the pollen. This results in the allergy antibody (IgE) binding to the food instead of the pollen. Patients typically report itching and/or mild swelling of the mouth and throat immediately following ingestion of certain uncooked fruits (including nuts) or raw vegetables. Only a very small number of affected individuals experience systemic allergic reactions, such as anaphylaxis which occurs with true food allergies.   This is not an all encompassing chart but helpful to see the possible associations.    Follow-up in 4-6 months or sooner if needed

## 2022-11-23 NOTE — Progress Notes (Signed)
New Patient Note  RE: Anna Vasquez MRN: [id number] DOB: [birth year] Date of Office Visit: 11/23/2022  Primary care provider: Dale [place], MD  Chief Complaint: EOE  History of present illness: Anna Vasquez is a 37 y.o. 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  She states the Dr [name] reported the appearance was consistent with EOE and was positive on biopsy.  Pathology report showed up to 25-30 eosinophils per high-power field in the distal and proximal esophagus. She states she was having unrelenting heartburn and was having food getting stuck in throat sensation.   She states with her first pregnancy 5 years ago she had worse heartburn that didn't really improve.  She states the swallowing issues she was having seem to start about 6 months ago.  She states thought maybe the swallowing issues were related to gastric sleeve she had done.  She tried doing a better job of turning of her food and eating smaller bites which did not really help.  However with starting medication for EOE symptoms improved greatly and she feels like symptoms may have been ongoing longer than she thought.  She was started on pantoprazole and swallowed Flovent dosing using 1 puff twice a day.  She states prior to medications it seems anything she ate could cause symptoms.  Since being on medication she has noted the following: eggs (can't handle smell/taste of egg anymore; used to eat on a regular basis before, triggered heartburn), tomato (throat felt funny), banana (mouth felt itchy), almonds (bad bloating).  She does have epipen prescribed recently.   Does not report seasonal/perennial allergy symptoms.  She states if she has been in a moldy environment she usually would develop weird throat sensation, watery eyes, URI symptom about a week after exposure.  She states she had pneumonia around 5-6th grade and had asthma  after but states resolved.   She had issues with hands with cracking and bleeding.  She has gone to dermatologist for this issue.   Review of systems: 10pt ROS negative unless noted above in HPI  Past medical history: Past Medical History:  Diagnosis Date   Eosinophilic esophagitis    Frequent headaches    GERD (gastroesophageal reflux disease)    H/O febrile seizure    Hx of migraines    Hypothyroidism    Hypothyroidism    PCOD (polycystic ovarian disease)    Pseudotumor cerebri    Thoracic outlet syndrome    extra cervical ribs   Vitamin D deficiency     Past surgical history: Past Surgical History:  Procedure Laterality Date   CARPAL TUNNEL RELEASE     rt and left   LAPAROSCOPIC GASTRIC SLEEVE RESECTION     MOUTH SURGERY  1998   UPPER GASTROINTESTINAL ENDOSCOPY      Family history:  Family History  Problem Relation Age of Onset   Rheum arthritis Mother    Arthritis Father    Hypertension Father    Sleep apnea Father    Transient ischemic attack Father    COPD Father    Alzheimer's disease Maternal Grandmother    Skin cancer Paternal Grandmother    Prostate cancer Paternal Grandfather    Breast cancer Paternal Aunt    Mental illness Paternal Uncle    Esophageal cancer Paternal Uncle    Colon cancer Neg Hx    Stomach cancer Neg Hx  Social history: Lives in a home with carpeting with electric heating and central cooling.  Dog in the home.  There is no concern for water damage, mildew or roaches in the home.  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  Does not report smoking history.   Medication List: Current Outpatient Medications  Medication Sig Dispense Refill   acetaminophen (TYLENOL) 325 MG tablet Take by mouth.     cyanocobalamin (VITAMIN B12) 1000 MCG/ML injection Inject 1 mL into the muscle once a week for 4 weeks and then once a month. 10 mL 0   EPINEPHrine (EPIPEN 2-PAK) 0.3 mg/0.3 mL IJ SOAJ injection Inject 0.3 mg into the  muscle as needed for anaphylaxis. 2 each 0   fluticasone (FLOVENT HFA) 110 MCG/ACT inhaler Use twice daily for 1 month then daily for 2 months. MUST SWALLOW, Do not eat or drink or rinse for 20 to 30 minutes thereafter. Then can rinse with warm water. 1 each 3   levothyroxine (SYNTHROID) 125 MCG tablet Take 1 tablet (125 mcg total) by mouth daily. 30 tablet 3   pantoprazole (PROTONIX) 40 MG tablet Take 1 tablet (40 mg total) by mouth daily. (Patient taking differently: Take 40 mg by mouth 2 (two) times daily.) 90 tablet 3   Syringe/Needle, Disp, (SYRINGE 3CC/25GX1") 25G X 1" 3 ML MISC Use to give IM B12 injection 50 each 0   No current facility-administered medications for this visit.    Known medication allergies: Allergies  Allergen Reactions   Tetracyclines & Related Other (See Comments)    Has had a Pseudotumor cerebri, was advised to list this medication as contraindicated      Physical examination: Blood pressure 118/76, pulse 66, resp. rate 16, height 5\' 6"  (1.676 m), weight 288 lb 12.8 oz (131 kg), SpO2 98%, unknown if currently breastfeeding.  General: Alert, interactive, in no acute distress. HEENT: PERRLA, TMs pearly gray, turbinates non-edematous without discharge, post-pharynx non erythematous. Neck: Supple without lymphadenopathy. Lungs: Clear to auscultation without wheezing, rhonchi or rales. {no increased work of breathing. CV: Normal S1, S2 without murmurs. Abdomen: Nondistended, nontender. Skin: Warm and dry, without lesions or rashes. Extremities:  No clubbing, cyanosis or edema. Neuro:   Grossly intact.  Diagnositics/Labs:  Allergy testing:   Airborne Adult Perc - 11/23/22 1030     Time Antigen Placed 1030    Allergen Manufacturer Greer    Location Back    Number of Test 55    Panel 1 Select    1. Control-Buffer 50% Glycerol Negative    2. Control-Histamine 2+    3. Bahia 2+    4. French Southern Territories Negative    5. Johnson Negative    6. Kentucky Blue Negative     7. Meadow Fescue Negative    8. Perennial Rye Negative    9. Timothy Negative    10. Ragweed Mix Negative    11. Cocklebur Negative    12. Plantain,  English Negative    13. Baccharis Negative    14. Dog Fennel Negative    15. Russian Thistle Negative    16. Lamb's Quarters Negative    17. Sheep Sorrell Negative    18. Rough Pigweed Negative    19. Marsh Elder, Rough Negative    20. Mugwort, Common Negative    21. Box, Elder Negative    22. Cedar, red Negative    23. Sweet Gum Negative    24. Pecan Pollen Negative    25. Pine Mix  Negative    26. Walnut, Black Pollen Negative    27. Red Mulberry Negative    28. Ash Mix Negative    29. Birch Mix Negative    30. Beech American Negative    31. Cottonwood, Guinea-Bissau Negative    32. Hickory, White Negative    33. Maple Mix Negative    34. Oak, [place] Mix Negative    35. Sycamore Eastern Negative    36. Alternaria Alternata Negative    37. Cladosporium Herbarum Negative    38. Aspergillus Mix Negative    39. Penicillium Mix 2+    40. Bipolaris Sorokiniana (Helminthosporium) Negative    41. Drechslera Spicifera (Curvularia) Negative    42. Mucor Plumbeus Negative    43. Fusarium Moniliforme Negative    44. Aureobasidium Pullulans (pullulara) Negative    45. Rhizopus Oryzae Negative    46. Botrytis Cinera Negative    47. Epicoccum Nigrum Negative    48. Phoma Betae Negative    49. Dust Mite Mix Negative    50. Cat Hair 10,000 BAU/ml Negative    51.  Dog Epithelia Negative    52. Mixed Feathers Negative    53. Horse Epithelia Negative    54. Cockroach, German Negative    55. Tobacco Leaf 2+             Food Adult Perc - 11/23/22 1000     Time Antigen Placed 1030    Allergen Manufacturer Greer    7. Egg White, Chicken 2+    12. Almond 2+    38. Tomato Negative    57. Banana Negative             Allergy testing results were read and interpreted by provider, documented by clinical staff.   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    - Allergen avoidance measures (as above) provided.  - Continue Pantoprazole twice a day.  - Continue swallowed Flovent 1 puff twice a day.  Do not eat or drink for 30 minutes after use.   If symptoms increase then increase Flovent to 2 puffs twice a day.  If this does not improve symptoms let me know and will recommend other treatment options  - Discussed today other treatment options including Dupixent weekly injections for EOE management as well as Eohilia (swallowed budesonide in pre-made packet).  She is doing well on her current regimen thus far and will continue and if needed can step up therapy - Follow up with Dr. [name] as scheduled for monitoring.  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  Would also above banana and tomato as cause symptoms (may be intolerance).  Banana may be oral allergy syndrome (see below)  - Have access to self-injectable epinephrine (Epipen or AuviQ) 0.3mg  at all times  - Follow emergency action plan in case of allergic reaction  - We have discussed the following in regards to foods:   Allergy: food allergy is when you have eaten a food, developed an allergic reaction after eating the food and have IgE to the food (positive food testing either by skin testing or blood testing).  Food allergy could lead to life threatening symptoms  Sensitivity: occurs when you have IgE to a food (positive food testing either by  skin testing or blood testing) but is a food you eat without any issues.  This is not an allergy and we recommend keeping the food in the diet  Intolerance: this is when you have negative testing by either skin testing or blood testing thus not allergic but the food causes symptoms (like belly pain,  bloating, diarrhea etc) with ingestion.  These foods should be avoided to prevent symptoms.     - The oral allergy syndrome (OAS) or pollen-food allergy syndrome (PFAS) is a relatively common form of food allergy, particularly in adults. It typically occurs in people who have pollen allergies when the immune system "sees" proteins on the food that look like proteins on the pollen. This results in the allergy antibody (IgE) binding to the food instead of the pollen. Patients typically report itching and/or mild swelling of the mouth and throat immediately following ingestion of certain uncooked fruits (including nuts) or raw vegetables. Only a very small number of affected individuals experience systemic allergic reactions, such as anaphylaxis which occurs with true food allergies.    Follow-up in 4-6 months or sooner if needed   I appreciate the opportunity to take part in Anna Vasquez's care. Please do not hesitate to contact me with questions.  Sincerely,   Margo Aye, MD Allergy/Immunology [facility]

## 2022-12-08 ENCOUNTER — Other Ambulatory Visit: Payer: Self-pay

## 2022-12-08 ENCOUNTER — Telehealth: Payer: Self-pay | Admitting: Internal Medicine

## 2022-12-08 MED ORDER — LEVOTHYROXINE SODIUM 125 MCG PO TABS: 125.0000 ug | ORAL_TABLET | Freq: Every day | ORAL | 3 refills | Status: DC

## 2022-12-08 NOTE — Telephone Encounter (Signed)
Medication refilled

## 2022-12-08 NOTE — Telephone Encounter (Signed)
Prescription Request  12/08/2022  LOV: 10/25/2022  What is the name of the medication or equipment? SYNTHROID   Have you contacted your pharmacy to request a refill? Yes   Which pharmacy would you like this sent to?  [facility]  Patient notified that their request is being sent to the clinical staff for review and that they should receive a response within 2 business days.   Please advise at Mobile [phone] (mobile)

## 2022-12-14 ENCOUNTER — Ambulatory Visit: Payer: BC Managed Care – PPO | Admitting: Podiatry

## 2022-12-21 ENCOUNTER — Ambulatory Visit (INDEPENDENT_AMBULATORY_CARE_PROVIDER_SITE_OTHER): Payer: BC Managed Care – PPO | Admitting: Podiatry

## 2022-12-21 DIAGNOSIS — M722 Plantar fascial fibromatosis: Secondary | ICD-10-CM

## 2022-12-21 NOTE — Patient Instructions (Signed)

## 2022-12-21 NOTE — Progress Notes (Signed)
  Subjective:  Patient ID: Anna Vasquez, female    DOB: 1985/11/26,  MRN: [id number]  Chief Complaint  Patient presents with   Follow-up    Follow up plantar fasciitis of right foot. Patient stated her pain is better but the pain returned about a week ago. She is using the powersteps.     37 y.o. female presents with the above complaint.  Patient presents for follow-up on right plantar fasciitis.  She says the pain is much better than it was previously after prior steroid injection.  She also feels that the night splint and PowerStep orthotics are helping a lot to reduce her pain and prevent them from coming back.   Review of Systems: Negative except as noted in the HPI. Denies N/V/F/Ch.   Objective:  There were no vitals filed for this visit. There is no height or weight on file to calculate BMI. Constitutional Well developed. Well nourished.  Vascular Dorsalis pedis pulses palpable bilaterally. Posterior tibial pulses palpable bilaterally. Capillary refill normal to all digits.  No cyanosis or clubbing noted. Pedal hair growth normal.  Neurologic Normal speech. Oriented to person, place, and time. Epicritic sensation to light touch grossly present bilaterally.  Dermatologic Nails well groomed and normal in appearance. No open wounds. No skin lesions.  Orthopedic: Normal joint ROM without pain or crepitus bilaterally. No visible deformities. Tender to palpation at the calcaneal tuber right. No pain with calcaneal squeeze right. Ankle ROM diminished range of motion right. Silfverskiold Test: negative right.   Radiographs: Taken and reviewed. No acute fractures or dislocations. No evidence of stress fracture.  Plantar heel spur present. Posterior heel spur absent.   Assessment:   1. Plantar fasciitis, right     Plan:  Patient was evaluated and treated and all questions answered.  Plantar Fasciitis, right -Much improved after prior steroid injection we will proceed  with second injection for residual pain - Educated on icing and stretching. Instructions given.  - Injection delivered to the plantar fascia as below. - DME: Continue power step orthotics and night splint - Pharmacologic management: Tylenol as needed for pain patient cannot take ibuprofen or NSAIDs  Procedure: Injection Tendon/Ligament Location: Right plantar fascia at the glabrous junction; medial approach. Skin Prep: alcohol Injectate: 1 cc 0.5% marcaine plain, 1 cc kenalog 10. Disposition: Patient tolerated procedure well. Injection site dressed with a band-aid.  Return if symptoms worsen or fail to improve.

## 2022-12-22 DIAGNOSIS — F4310 Post-traumatic stress disorder, unspecified: Secondary | ICD-10-CM | POA: Diagnosis not present

## 2022-12-27 DIAGNOSIS — R509 Fever, unspecified: Secondary | ICD-10-CM | POA: Diagnosis not present

## 2022-12-27 DIAGNOSIS — A084 Viral intestinal infection, unspecified: Secondary | ICD-10-CM | POA: Diagnosis not present

## 2022-12-27 DIAGNOSIS — R059 Cough, unspecified: Secondary | ICD-10-CM | POA: Diagnosis not present

## 2022-12-27 DIAGNOSIS — J22 Unspecified acute lower respiratory infection: Secondary | ICD-10-CM | POA: Diagnosis not present

## 2022-12-27 DIAGNOSIS — R051 Acute cough: Secondary | ICD-10-CM | POA: Diagnosis not present

## 2022-12-30 DIAGNOSIS — F4310 Post-traumatic stress disorder, unspecified: Secondary | ICD-10-CM | POA: Diagnosis not present

## 2023-01-04 ENCOUNTER — Ambulatory Visit (INDEPENDENT_AMBULATORY_CARE_PROVIDER_SITE_OTHER): Payer: BC Managed Care – PPO | Admitting: Internal Medicine

## 2023-01-04 ENCOUNTER — Encounter: Payer: Self-pay | Admitting: Internal Medicine

## 2023-01-04 VITALS — BP 110/70 | HR 62 | Temp 97.9°F | Resp 16 | Ht 66.0 in | Wt 291.0 lb

## 2023-01-04 DIAGNOSIS — K219 Gastro-esophageal reflux disease without esophagitis: Secondary | ICD-10-CM

## 2023-01-04 DIAGNOSIS — D649 Anemia, unspecified: Secondary | ICD-10-CM | POA: Diagnosis not present

## 2023-01-04 DIAGNOSIS — E039 Hypothyroidism, unspecified: Secondary | ICD-10-CM | POA: Diagnosis not present

## 2023-01-04 DIAGNOSIS — K200 Eosinophilic esophagitis: Secondary | ICD-10-CM | POA: Diagnosis not present

## 2023-01-04 DIAGNOSIS — M722 Plantar fascial fibromatosis: Secondary | ICD-10-CM

## 2023-01-04 MED ORDER — PANTOPRAZOLE SODIUM 40 MG PO TBEC: 40.0000 mg | DELAYED_RELEASE_TABLET | Freq: Two times a day (BID) | ORAL | 1 refills | Status: DC

## 2023-01-04 NOTE — Patient Instructions (Signed)
Oral B12 per day

## 2023-01-04 NOTE — Progress Notes (Signed)
Subjective:    Patient ID: Anna Vasquez, female    DOB: 05-Feb-1986, 37 y.o.   MRN: [id number]  Patient here for  Chief Complaint  Patient presents with   Medical Management of Chronic Issues    HPI Here for follow up appt.   Has been taking protonix for GERD.  When stopped - had immediate return of symptoms. Saw GI 07/19/22 - recommended EGD and colonoscopy - both performed 09/21/22. EGD - esophagitis, esophageal stenosis, gastritis and small hiatal hernia. Colonoscopy - non bleeding internal hemorrhoids otherwise normal. Biopsy c/w EOE. Recommended start flovent inhalers and referral to allergist. Evaluated by an allergist 11/23/22.  Recommended to continue to follow and continue above recommendations. Was referred to podiatry for plantar fasciitis right foot. Evalauted 11/08/22 - s/p injection. Also recommended night splint and power step orthotics. Had f/u 12/21/22 - improved.  S/p injection. Better. Evaluated 12/27/22 - diagnosed with lower respiratory tract infection. Symptoms had been present for two weeks.  (Cough, fever, diarrhea and emesis).  Treated with augmentin, prednisone and zofran. CXR revealed no acute cardiopulmonary abnormality.  Symptoms have improved. Appetite improved.    Past Medical History:  Diagnosis Date   Eosinophilic esophagitis    Frequent headaches    GERD (gastroesophageal reflux disease)    H/O febrile seizure    Hx of migraines    Hypothyroidism    Hypothyroidism    PCOD (polycystic ovarian disease)    Pseudotumor cerebri    Thoracic outlet syndrome    extra cervical ribs   Vitamin D deficiency    Past Surgical History:  Procedure Laterality Date   CARPAL TUNNEL RELEASE     rt and left   LAPAROSCOPIC GASTRIC SLEEVE RESECTION     MOUTH SURGERY  1998   UPPER GASTROINTESTINAL ENDOSCOPY     Family History  Problem Relation Age of Onset   Rheum arthritis Mother    Arthritis Father    Hypertension Father    Sleep apnea Father    Transient ischemic  attack Father    COPD Father    Alzheimer's disease Maternal Grandmother    Skin cancer Paternal Grandmother    Prostate cancer Paternal Grandfather    Breast cancer Paternal Aunt    Mental illness Paternal Uncle    Esophageal cancer Paternal Uncle    Colon cancer Neg Hx    Stomach cancer Neg Hx    Social History   Socioeconomic History   Marital status: Married    Spouse name: Not on file   Number of children: Not on file   Years of education: Not on file   Highest education level: Master's degree (e.g., MA, MS, MEng, MEd, MSW, MBA)  Occupational History   Not on file  Tobacco Use   Smoking status: Never   Smokeless tobacco: Never  Vaping Use   Vaping status: Never Used  Substance and Sexual Activity   Alcohol use: Yes    Alcohol/week: 0.0 standard drinks of alcohol   Drug use: No   Sexual activity: Yes  Other Topics Concern   Not on file  Social History Narrative   Not on file   Social Determinants of Health   Financial Resource Strain: Low Risk  (12/31/2022)   Overall Financial Resource Strain (CARDIA)    Difficulty of Paying Living Expenses: Not very hard  Food Insecurity: No Food Insecurity (12/31/2022)   Hunger Vital Sign    Worried About Running Out of Food in the Last Year: Never true  Ran Out of Food in the Last Year: Never true  Transportation Needs: No Transportation Needs (12/31/2022)   PRAPARE - Administrator, Civil Service (Medical): No    Lack of Transportation (Non-Medical): No  Physical Activity: Insufficiently Active (12/31/2022)   Exercise Vital Sign    Days of Exercise per Week: 3 days    Minutes of Exercise per Session: 30 min  Stress: Stress Concern Present (12/31/2022)   [employer] of Occupational Health - Occupational Stress Questionnaire    Feeling of Stress : To some extent  Social Connections: Moderately Integrated (12/31/2022)   Social Connection and Isolation Panel [NHANES]    Frequency of Communication with  Friends and Family: Never    Frequency of Social Gatherings with Friends and Family: Once a week    Attends Religious Services: More than 4 times per year    Active Member of [employer] or Organizations: Yes    Attends Engineer, structural: More than 4 times per year    Marital Status: Married     Review of Systems  Constitutional:  Negative for unexpected weight change.       Appetite improved.   HENT:  Negative for congestion and sinus pressure.   Respiratory:  Negative for chest tightness and shortness of breath.        Cough improved.   Cardiovascular:  Negative for chest pain and palpitations.  Gastrointestinal:  Negative for abdominal pain, nausea and vomiting.  Genitourinary:  Negative for difficulty urinating and dysuria.  Musculoskeletal:  Negative for joint swelling and myalgias.  Skin:  Negative for color change and rash.  Neurological:  Negative for dizziness and headaches.  Psychiatric/Behavioral:  Negative for agitation and dysphoric mood.        Objective:     BP 110/70   Pulse 62   Temp 97.9 F (36.6 C)   Resp 16   Ht 5\' 6"  (1.676 m)   Wt 291 lb (132 kg)   SpO2 99%   BMI 46.97 kg/m  Wt Readings from Last 3 Encounters:  01/04/23 291 lb (132 kg)  11/23/22 288 lb 12.8 oz (131 kg)  10/25/22 287 lb (130.2 kg)    Physical Exam Vitals reviewed.  Constitutional:      General: She is not in acute distress.    Appearance: Normal appearance.  HENT:     Head: Normocephalic and atraumatic.     Right Ear: External ear normal.     Left Ear: External ear normal.  Eyes:     General: No scleral icterus.       Right eye: No discharge.        Left eye: No discharge.     Conjunctiva/sclera: Conjunctivae normal.  Neck:     Thyroid: No thyromegaly.  Cardiovascular:     Rate and Rhythm: Normal rate and regular rhythm.  Pulmonary:     Effort: No respiratory distress.     Breath sounds: Normal breath sounds. No wheezing.  Abdominal:     General: Bowel  sounds are normal.     Palpations: Abdomen is soft.     Tenderness: There is no abdominal tenderness.  Musculoskeletal:        General: No swelling or tenderness.     Cervical back: Neck supple. No tenderness.  Lymphadenopathy:     Cervical: No cervical adenopathy.  Skin:    Findings: No erythema or rash.  Neurological:     Mental Status: She is alert.  Psychiatric:  Mood and Affect: Mood normal.        Behavior: Behavior normal.      Outpatient Encounter Medications as of 01/04/2023  Medication Sig   acetaminophen (TYLENOL) 325 MG tablet Take by mouth.   cyanocobalamin (VITAMIN B12) 1000 MCG/ML injection Inject 1 mL into the muscle once a week for 4 weeks and then once a month.   EPINEPHrine (EPIPEN 2-PAK) 0.3 mg/0.3 mL IJ SOAJ injection Inject 0.3 mg into the muscle as needed for anaphylaxis.   fluticasone (FLOVENT HFA) 110 MCG/ACT inhaler Use twice daily for 1 month then daily for 2 months. MUST SWALLOW, Do not eat or drink or rinse for 20 to 30 minutes thereafter. Then can rinse with warm water.   levothyroxine (SYNTHROID) 125 MCG tablet Take 1 tablet (125 mcg total) by mouth daily.   pantoprazole (PROTONIX) 40 MG tablet Take 1 tablet (40 mg total) by mouth 2 (two) times daily.   Syringe/Needle, Disp, (SYRINGE 3CC/25GX1") 25G X 1" 3 ML MISC Use to give IM B12 injection   [DISCONTINUED] pantoprazole (PROTONIX) 40 MG tablet Take 1 tablet (40 mg total) by mouth daily. (Patient taking differently: Take 40 mg by mouth 2 (two) times daily.)   No facility-administered encounter medications on file as of 01/04/2023.     Lab Results  Component Value Date   WBC 7.5 10/25/2022   HGB 11.5 (L) 10/25/2022   HCT 36.6 10/25/2022   PLT 226.0 10/25/2022   GLUCOSE 86 10/25/2022   CHOL 166 05/21/2022   TRIG 66 05/21/2022   HDL 73 05/21/2022   LDLCALC 80 05/21/2022   ALT 13 07/19/2022   AST 19 07/19/2022   NA 138 10/25/2022   K 4.4 10/25/2022   CL 105 10/25/2022   CREATININE  0.72 10/25/2022   BUN 11 10/25/2022   CO2 25 10/25/2022   TSH 1.09 10/25/2022   INR 1.0 08/25/2012   HGBA1C 5.2 08/25/2012    DG Chest 2 View  Result Date: 11/06/2021 CLINICAL DATA:  Severe obesity. Gastric sleeve to be converted to gastric bypass. EXAM: CHEST - 2 VIEW COMPARISON:  Chest radiograph 08/25/2012 FINDINGS: The cardiomediastinal contours are normal. The lungs are clear. Pulmonary vasculature is normal. No consolidation, pleural effusion, or pneumothorax. No acute osseous abnormalities are seen. IMPRESSION: Negative radiographs of the chest. Electronically Signed   By: Narda Rutherford M.D.   On: 11/06/2021 21:39   DG UGI W SINGLE CM (SOL OR THIN BA)  Result Date: 11/05/2021 CLINICAL DATA:  History of gastric sleeve surgery 2014. Request for single contrast medium upper GI for evaluation for revision to gastric bypass. EXAM: DG UGI W SINGLE CM TECHNIQUE: Scout radiograph was obtained. Single contrast examination was performed using thin liquid barium. This exam was performed by Alex Gardener, NP, and was supervised and interpreted by Genevive Bi, MD. FLUOROSCOPY: Radiation Exposure Index (as provided by the fluoroscopic device): 46.60 mGy Kerma COMPARISON:  None Available. FINDINGS: Scout Radiograph: Normal bowel-gas pattern Esophagus:  Normal appearance. Esophageal motility:  Within normal limits. Gastroesophageal reflux: None visualized with provocation maneuvers. Ingested 13mm barium tablet:  Not given Stomach: Normal appearance.  Small hiatal hernia. Gastric emptying: Normal. Duodenum:  Normal appearance. Other:  None. IMPRESSION: Small hiatal hernia. No gastroesophageal reflux noted. Normal appearance of esophagus, stomach and duodenum. Read by: Alex Gardener, AGNP-BC Electronically Signed   By: Genevive Bi M.D.   On: 11/05/2021 11:58       Assessment & Plan:  Eosinophilic esophagitis Assessment & Plan:  Saw  GI 07/19/22 - recommended EGD and colonoscopy - both performed  09/21/22. EGD - esophagitis, esophageal stenosis, gastritis and small hiatal hernia. Colonoscopy - non bleeding internal hemorrhoids otherwise normal. Biopsy c/w EOE. Recommended start flovent inhalers and referral to allergist.  Continue PPI. Taking bid and using inhalers.  Saw allergist.  Reviewed.  Continue to monitor and continue above.    Gastroesophageal reflux disease, unspecified whether esophagitis present Assessment & Plan:  Saw GI 07/19/22 - recommended EGD and colonoscopy - both performed 09/21/22. EGD - esophagitis, esophageal stenosis, gastritis and small hiatal hernia. Colonoscopy - non bleeding internal hemorrhoids otherwise normal. Biopsy c/w EOE. Recommended start flovent inhalers and referral to allergist.  Continue PPI. Taking bid and using inhalers.  Symptoms have improved.    Anemia, unspecified type Assessment & Plan: Follow cbc.    Hypothyroidism, unspecified type Assessment & Plan: On synthroid.  Follow tsh.     Plantar fasciitis Assessment & Plan: Plantar fasciitis - podiatry (10/2022) - night splint, orthotics, injection. F/u 11/2022 - improved. second injection    Other orders -     Pantoprazole Sodium; Take 1 tablet (40 mg total) by mouth 2 (two) times daily.  Dispense: 180 tablet; Refill: 1     Dale [place], MD

## 2023-01-09 ENCOUNTER — Encounter: Payer: Self-pay | Admitting: Internal Medicine

## 2023-01-09 NOTE — Assessment & Plan Note (Signed)
On synthroid.  Follow tsh.   

## 2023-01-09 NOTE — Assessment & Plan Note (Signed)
Plantar fasciitis - podiatry (10/2022) - night splint, orthotics, injection. F/u 11/2022 - improved. second injection

## 2023-01-09 NOTE — Assessment & Plan Note (Signed)
Follow cbc.  

## 2023-01-09 NOTE — Assessment & Plan Note (Signed)
Saw GI 07/19/22 - recommended EGD and colonoscopy - both performed 09/21/22. EGD - esophagitis, esophageal stenosis, gastritis and small hiatal hernia. Colonoscopy - non bleeding internal hemorrhoids otherwise normal. Biopsy c/w EOE. Recommended start flovent inhalers and referral to allergist.  Continue PPI. Taking bid and using inhalers.  Symptoms have improved.

## 2023-01-09 NOTE — Assessment & Plan Note (Signed)
Saw GI 07/19/22 - recommended EGD and colonoscopy - both performed 09/21/22. EGD - esophagitis, esophageal stenosis, gastritis and small hiatal hernia. Colonoscopy - non bleeding internal hemorrhoids otherwise normal. Biopsy c/w EOE. Recommended start flovent inhalers and referral to allergist.  Continue PPI. Taking bid and using inhalers.  Saw allergist.  Reviewed.  Continue to monitor and continue above.

## 2023-01-18 DIAGNOSIS — F4310 Post-traumatic stress disorder, unspecified: Secondary | ICD-10-CM | POA: Diagnosis not present

## 2023-01-21 ENCOUNTER — Encounter: Payer: Self-pay | Admitting: Internal Medicine

## 2023-01-21 NOTE — Telephone Encounter (Signed)
Called patient. Advised needs to be evaluated. Lives in [place]. She is going to check her schedule and see if able to make appt or if not she is going to go to urgent care locally.

## 2023-01-22 ENCOUNTER — Other Ambulatory Visit: Payer: Self-pay | Admitting: Internal Medicine

## 2023-01-25 DIAGNOSIS — F4310 Post-traumatic stress disorder, unspecified: Secondary | ICD-10-CM | POA: Diagnosis not present

## 2023-02-02 DIAGNOSIS — F4310 Post-traumatic stress disorder, unspecified: Secondary | ICD-10-CM | POA: Diagnosis not present

## 2023-02-09 DIAGNOSIS — F4310 Post-traumatic stress disorder, unspecified: Secondary | ICD-10-CM | POA: Diagnosis not present

## 2023-02-10 ENCOUNTER — Other Ambulatory Visit: Payer: Self-pay | Admitting: Gastroenterology

## 2023-02-10 DIAGNOSIS — K200 Eosinophilic esophagitis: Secondary | ICD-10-CM

## 2023-02-22 DIAGNOSIS — L820 Inflamed seborrheic keratosis: Secondary | ICD-10-CM | POA: Diagnosis not present

## 2023-02-22 DIAGNOSIS — L209 Atopic dermatitis, unspecified: Secondary | ICD-10-CM | POA: Diagnosis not present

## 2023-02-23 DIAGNOSIS — F4310 Post-traumatic stress disorder, unspecified: Secondary | ICD-10-CM | POA: Diagnosis not present

## 2023-03-03 DIAGNOSIS — F4310 Post-traumatic stress disorder, unspecified: Secondary | ICD-10-CM | POA: Diagnosis not present

## 2023-03-10 DIAGNOSIS — F4310 Post-traumatic stress disorder, unspecified: Secondary | ICD-10-CM | POA: Diagnosis not present

## 2023-03-17 ENCOUNTER — Ambulatory Visit (HOSPITAL_BASED_OUTPATIENT_CLINIC_OR_DEPARTMENT_OTHER)
Admission: RE | Admit: 2023-03-17 | Discharge: 2023-03-17 | Disposition: A | Discharge status: Home / Self Care | Payer: BC Managed Care – PPO | Source: Ambulatory Visit | Attending: Internal Medicine | Admitting: Internal Medicine

## 2023-03-17 ENCOUNTER — Encounter (HOSPITAL_BASED_OUTPATIENT_CLINIC_OR_DEPARTMENT_OTHER): Payer: Self-pay

## 2023-03-17 VITALS — BP 121/81 | HR 65 | Temp 98.8°F | Resp 20

## 2023-03-17 DIAGNOSIS — J069 Acute upper respiratory infection, unspecified: Secondary | ICD-10-CM | POA: Diagnosis not present

## 2023-03-17 DIAGNOSIS — H9203 Otalgia, bilateral: Secondary | ICD-10-CM | POA: Diagnosis not present

## 2023-03-17 DIAGNOSIS — J029 Acute pharyngitis, unspecified: Secondary | ICD-10-CM | POA: Diagnosis not present

## 2023-03-17 DIAGNOSIS — R509 Fever, unspecified: Secondary | ICD-10-CM | POA: Diagnosis not present

## 2023-03-17 MED ORDER — PROMETHAZINE-DM 6.25-15 MG/5ML PO SYRP
5.0000 mL | ORAL_SOLUTION | Freq: Every evening | ORAL | 0 refills | Status: DC | PRN
Start: 2023-03-17 — End: 2023-04-12

## 2023-03-17 NOTE — ED Provider Notes (Signed)
[facility]    CSN: [id number] Arrival date & time: 03/17/23  1049      History   Chief Complaint Chief Complaint  Patient presents with   Sore Throat    Ear pain - Entered by patient   Otalgia    HPI Anna Vasquez is a 37 y.o. female.   Patient presents to urgent care for evaluation of sore throat, bilateral ear pain, cough, fever, and generalized fatigue that started abruptly on March 14, 2023 (3 days ago).  Max temp at home 102.0 this morning, fever has responded well to use of Tylenol and ibuprofen at home.  Sore throat is worsened by swallowing.  She has also noticed some lymph node swelling to the axilla and the bilateral neck.  Cough is mostly dry and nonproductive.  Both kids are also sick and recently diagnosed with bilateral ear infections, otherwise no recent sick contacts with similar symptoms.  Children attend preschool.  No history of chronic respiratory problems.  Never smoker, denies drug use.  Taking over-the-counter Tylenol and ibuprofen with some relief of fever.   Sore Throat  Otalgia   Past Medical History:  Diagnosis Date   Eosinophilic esophagitis    Frequent headaches    GERD (gastroesophageal reflux disease)    H/O febrile seizure    Hx of migraines    Hypothyroidism    Hypothyroidism    PCOD (polycystic ovarian disease)    Pseudotumor cerebri    Thoracic outlet syndrome    extra cervical ribs   Vitamin D deficiency     Patient Active Problem List   Diagnosis Date Noted   Plantar fasciitis 11/08/2022   Fatigue 10/25/2022   Low back pain 10/25/2022   Eosinophilic esophagitis 09/20/2022   Rectal bleeding 07/19/2022   Iron excess 10/25/2021   Rh negative, maternal / newborn Rh negative 02/05/2020   Encounter for induction of labor [facility] 02/04/2020   Indication for care in labor or delivery 02/04/2020   Postpartum care following vaginal delivery 11/15 02/04/2020   Severe obesity (BMI >= 40) (HCC) 02/04/2020   Anemia  02/12/2018   Diarrhea 05/07/2015   Health care maintenance 03/09/2015   Carpal tunnel syndrome, bilateral upper limbs 12/31/2014   Joint pain 03/19/2014   Pseudotumor cerebri 09/30/2012   GERD (gastroesophageal reflux disease) 09/30/2012   Hypothyroidism 09/30/2012   PCOD (polycystic ovarian disease) 09/30/2012    Past Surgical History:  Procedure Laterality Date   CARPAL TUNNEL RELEASE     rt and left   LAPAROSCOPIC GASTRIC SLEEVE RESECTION     MOUTH SURGERY  1998   UPPER GASTROINTESTINAL ENDOSCOPY      OB History     Gravida  2   Para  2   Term  2   Preterm      AB      Living  2      SAB      IAB      Ectopic      Multiple  0   Live Births  2            Home Medications    Prior to Admission medications   Medication Sig Start Date End Date Taking? Authorizing Provider  promethazine-dextromethorphan (PROMETHAZINE-DM) 6.25-15 MG/5ML syrup Take 5 mLs by mouth at bedtime as needed for cough. 03/17/23  Yes Carlisle Beers, FNP  acetaminophen (TYLENOL) 325 MG tablet Take by mouth.    [provider]  cyanocobalamin (VITAMIN B12) 1000 MCG/ML injection  INJECT 1 ML INTO THE MUSCLE ONCE A WEEK FOR 4 WEEKS AND THEN ONCE A MONTH. 01/25/23   Dale [place], MD  EPINEPHrine (EPIPEN 2-PAK) 0.3 mg/0.3 mL IJ SOAJ injection Inject 0.3 mg into the muscle as needed for anaphylaxis. 10/25/22   Dale [place], MD  fluticasone (FLOVENT HFA) 110 MCG/ACT inhaler Use twice daily for 1 month then daily for 2 months. MUST SWALLOW, Do not eat or drink or rinse for 20 to 30 minutes thereafter. Then can rinse with warm water. 10/05/22   Lynann Bolog[place], MD  levothyroxine (SYNTHROID) 125 MCG tablet Take 1 tablet (125 mcg total) by mouth daily. 12/08/22   Dale [place], MD  pantoprazole (PROTONIX) 40 MG tablet Take 1 tablet (40 mg total) by mouth 2 (two) times daily. 01/04/23   Dale [place], MD  Syringe/Needle, Disp, (SYRINGE 3CC/25GX1") 25G X 1" 3 ML MISC Use to give  IM B12 injection 10/26/22   Dale [place], MD    Family History Family History  Problem Relation Age of Onset   Rheum arthritis Mother    Arthritis Father    Hypertension Father    Sleep apnea Father    Transient ischemic attack Father    COPD Father    Alzheimer's disease Maternal Grandmother    Skin cancer Paternal Grandmother    Prostate cancer Paternal Grandfather    Breast cancer Paternal Aunt    Mental illness Paternal Uncle    Esophageal cancer Paternal Uncle    Colon cancer Neg Hx    Stomach cancer Neg Hx     Social History Social History   Tobacco Use   Smoking status: Never   Smokeless tobacco: Never  Vaping Use   Vaping status: Never Used  Substance Use Topics   Alcohol use: Yes    Alcohol/week: 0.0 standard drinks of alcohol   Drug use: No     Allergies   Tetracyclines & related   Review of Systems Review of Systems  HENT:  Positive for ear pain.   Per HPI  Physical Exam Triage Vital Signs ED Triage Vitals  Encounter Vitals Group     BP 03/17/23 1229 121/81     Systolic BP Percentile --      Diastolic BP Percentile --      Pulse Rate 03/17/23 1227 65     Resp 03/17/23 1227 20     Temp 03/17/23 1227 98.8 F (37.1 C)     Temp Source 03/17/23 1227 Oral     SpO2 03/17/23 1227 99 %     Weight --      Height --      Head Circumference --      Peak Flow --      Pain Score 03/17/23 1229 6     Pain Loc --      Pain Education --      Exclude from Growth Chart --    No data found.  Updated Vital Signs BP 121/81 (BP Location: Right Arm)   Pulse 65   Temp 98.8 F (37.1 C) (Oral)   Resp 20   LMP 03/01/2023 (Exact Date)   SpO2 99%   Visual Acuity Right Eye Distance:   Left Eye Distance:   Bilateral Distance:    Right Eye Near:   Left Eye Near:    Bilateral Near:     Physical Exam Vitals and nursing note reviewed.  Constitutional:      Appearance: She is not ill-appearing or toxic-appearing.  HENT:  Head: Normocephalic and  atraumatic.     Right Ear: Hearing, tympanic membrane, ear canal and external ear normal.     Left Ear: Hearing, tympanic membrane, ear canal and external ear normal.     Nose: Congestion present.     Mouth/Throat:     Lips: Pink.     Mouth: Mucous membranes are moist. No injury.     Tongue: No lesions. Tongue does not deviate from midline.     Palate: No mass and lesions.     Pharynx: Oropharynx is clear. Uvula midline. No pharyngeal swelling, oropharyngeal exudate, posterior oropharyngeal erythema or uvula swelling.     Tonsils: No tonsillar exudate or tonsillar abscesses. 2+ on the right. 2+ on the left.  Eyes:     General: Lids are normal. Vision grossly intact. Gaze aligned appropriately.     Extraocular Movements: Extraocular movements intact.     Conjunctiva/sclera: Conjunctivae normal.  Cardiovascular:     Rate and Rhythm: Normal rate and regular rhythm.     Heart sounds: Normal heart sounds, S1 normal and S2 normal.  Pulmonary:     Effort: Pulmonary effort is normal. No respiratory distress.     Breath sounds: Normal breath sounds and air entry. No wheezing, rhonchi or rales.  Chest:     Chest wall: No tenderness.  Musculoskeletal:     Cervical back: Neck supple.  Skin:    General: Skin is warm and dry.     Capillary Refill: Capillary refill takes less than 2 seconds.     Findings: No rash.  Neurological:     General: No focal deficit present.     Mental Status: She is alert and oriented to person, place, and time. Mental status is at baseline.     Cranial Nerves: No dysarthria or facial asymmetry.  Psychiatric:        Mood and Affect: Mood normal.        Speech: Speech normal.        Behavior: Behavior normal.        Thought Content: Thought content normal.        Judgment: Judgment normal.      UC Treatments / Results  Labs (all labs ordered are listed, but only abnormal results are displayed) Labs Reviewed  SARS CORONAVIRUS 2 (TAT 6-24 HRS)     EKG   Radiology No results found.  Procedures Procedures (including critical care time)  Medications Ordered in UC Medications - No data to display  Initial Impression / Assessment and Plan / UC Course  I have reviewed the triage vital signs and the nursing notes.  Pertinent labs & imaging results that were available during my care of the patient were reviewed by me and considered in my medical decision making (see chart for details).   1.  Viral URI with cough Suspect viral URI, viral syndrome. Physical exam findings reassuring, vital signs hemodynamically stable. Low suspicion for pneumonia/acute cardiopulmonary abnormality, therefore deferred imaging of the chest. Advised supportive care, offered prescriptions for symptomatic relief.  Recommend continued use of OTC medications as needed, recommendations discussed with patient/caregiver and outlined in AVS.  Strep/viral testing:  COVID testing pending, she is a candidate for antiviral if positive.  Counseled patient on potential for adverse effects with medications prescribed/recommended today, strict ER and return-to-clinic precautions discussed, patient verbalized understanding.    Final Clinical Impressions(s) / UC Diagnoses   Final diagnoses:  Viral URI with cough     Discharge Instructions  You have a viral illness which will improve on its own with rest, fluids, and medications to help with your symptoms. We discussed prescriptions that may help with your symptoms: promethazine DM at bedtime as needed for cough You may use over the counter medicines as needed: tylenol/motrin, mucinex, zyrtec, Flonase Two teaspoons of honey in 1 cup of warm water every 4-6 hours may help with throat pains. Humidifier in room at nighttime may help soothe cough (clean well daily).   For chest pain, shortness of breath, inability to keep food or fluids down without vomiting, fever that does not respond to tylenol or motrin, or  any other severe symptoms, please go to the ER for further evaluation. Return to urgent care as needed, otherwise follow-up with PCP.      ED Prescriptions     Medication Sig Dispense Auth. Provider   promethazine-dextromethorphan (PROMETHAZINE-DM) 6.25-15 MG/5ML syrup Take 5 mLs by mouth at bedtime as needed for cough. 118 mL Carlisle Beers, FNP      PDMP not reviewed this encounter.   Carlisle Beers, [place] 03/17/23 1306

## 2023-03-17 NOTE — Discharge Instructions (Addendum)
 You have a viral illness which will improve on its own with rest, fluids, and medications to help with your symptoms. We discussed prescriptions that may help with your symptoms: promethazine DM at bedtime as needed for cough You may use over the counter medicines as needed: tylenol/motrin, mucinex, zyrtec, Flonase Two teaspoons of honey in 1 cup of warm water every 4-6 hours may help with throat pains. Humidifier in room at nighttime may help soothe cough (clean well daily).   For chest pain, shortness of breath, inability to keep food or fluids down without vomiting, fever that does not respond to tylenol or motrin, or any other severe symptoms, please go to the ER for further evaluation. Return to urgent care as needed, otherwise follow-up with PCP.

## 2023-03-17 NOTE — ED Triage Notes (Signed)
Bilat ear pain. Sore throat. Fever 12/23. Sore throat and ear pain noted on Christmas Eve with worsening this morning. Children ill as well and seen by pediatrician this morning.

## 2023-03-19 LAB — SARS CORONAVIRUS 2 (TAT 6-24 HRS): SARS Coronavirus 2: NEGATIVE

## 2023-03-20 ENCOUNTER — Encounter (HOSPITAL_BASED_OUTPATIENT_CLINIC_OR_DEPARTMENT_OTHER): Payer: Self-pay

## 2023-03-20 ENCOUNTER — Ambulatory Visit (HOSPITAL_BASED_OUTPATIENT_CLINIC_OR_DEPARTMENT_OTHER)
Admission: RE | Admit: 2023-03-20 | Discharge: 2023-03-20 | Disposition: A | Discharge status: Home / Self Care | Payer: BC Managed Care – PPO | Source: Ambulatory Visit | Attending: Internal Medicine | Admitting: Internal Medicine

## 2023-03-20 VITALS — BP 128/79 | HR 104 | Temp 98.8°F | Resp 20

## 2023-03-20 DIAGNOSIS — H66003 Acute suppurative otitis media without spontaneous rupture of ear drum, bilateral: Secondary | ICD-10-CM

## 2023-03-20 DIAGNOSIS — H1032 Unspecified acute conjunctivitis, left eye: Secondary | ICD-10-CM

## 2023-03-20 MED ORDER — AMOXICILLIN-POT CLAVULANATE 875-125 MG PO TABS: 1.0000 | ORAL_TABLET | Freq: Two times a day (BID) | ORAL | 0 refills | Status: DC

## 2023-03-20 MED ORDER — POLYMYXIN B-TRIMETHOPRIM 10000-0.1 UNIT/ML-% OP SOLN: 1.0000 [drp] | Freq: Four times a day (QID) | OPHTHALMIC | 0 refills | Status: AC

## 2023-03-20 NOTE — ED Triage Notes (Signed)
Pt was seen on 12/26 not getting any better covid test was negative, pt having bilateral ear pain, cough, sore throat, and eye swollen.

## 2023-03-20 NOTE — Discharge Instructions (Addendum)
Continue the cough medications, humidified air, increase fluid intake, rest

## 2023-03-20 NOTE — ED Provider Notes (Signed)
[facility]    CSN: [id number] Arrival date & time: 03/20/23  0801      History   Chief Complaint Chief Complaint  Patient presents with   Cough    I was seen on 12/26 & told I had Covid, but my test came back negative. I have gotten progressively worse. I now have a severe cough, swollen/oozing eye, increased ear pain/clogged ears, unrelenting throat pain, & persistent fever. - Entered by patient    HPI Anna Vasquez is a 37 y.o. female.   The history is provided by the patient.  Cough Associated symptoms: ear pain, fever, rhinorrhea and sore throat   Associated symptoms: no chest pain, no shortness of breath and no wheezing   Sick for 6 days has had fever to 102, body aches, fatigue, rhinorrhea, nasal congestion, cough, sore throat, swollen glands.  Seen here several days ago had a COVID test that was negative.  She is getting worse.  Multiple family members have been sick, her children have had ear infections.  Now having left eye redness, drainage matting and crusting denies eye pain, change in vision, injury   Past Medical History:  Diagnosis Date   Eosinophilic esophagitis    Frequent headaches    GERD (gastroesophageal reflux disease)    H/O febrile seizure    Hx of migraines    Hypothyroidism    Hypothyroidism    PCOD (polycystic ovarian disease)    Pseudotumor cerebri    Thoracic outlet syndrome    extra cervical ribs   Vitamin D deficiency     Patient Active Problem List   Diagnosis Date Noted   Plantar fasciitis 11/08/2022   Fatigue 10/25/2022   Low back pain 10/25/2022   Eosinophilic esophagitis 09/20/2022   Rectal bleeding 07/19/2022   Iron excess 10/25/2021   Rh negative, maternal / newborn Rh negative 02/05/2020   Encounter for induction of labor [facility] 02/04/2020   Indication for care in labor or delivery 02/04/2020   Postpartum care following vaginal delivery 11/15 02/04/2020   Severe obesity (BMI >= 40) (HCC) 02/04/2020   Anemia  02/12/2018   Diarrhea 05/07/2015   Health care maintenance 03/09/2015   Carpal tunnel syndrome, bilateral upper limbs 12/31/2014   Joint pain 03/19/2014   Pseudotumor cerebri 09/30/2012   GERD (gastroesophageal reflux disease) 09/30/2012   Hypothyroidism 09/30/2012   PCOD (polycystic ovarian disease) 09/30/2012    Past Surgical History:  Procedure Laterality Date   CARPAL TUNNEL RELEASE     rt and left   LAPAROSCOPIC GASTRIC SLEEVE RESECTION     MOUTH SURGERY  1998   UPPER GASTROINTESTINAL ENDOSCOPY      OB History     Gravida  2   Para  2   Term  2   Preterm      AB      Living  2      SAB      IAB      Ectopic      Multiple  0   Live Births  2            Home Medications    Prior to Admission medications   Medication Sig Start Date End Date Taking? Authorizing Provider  acetaminophen (TYLENOL) 325 MG tablet Take by mouth.    [provider]  cyanocobalamin (VITAMIN B12) 1000 MCG/ML injection INJECT 1 ML INTO THE MUSCLE ONCE A WEEK FOR 4 WEEKS AND THEN ONCE A MONTH. 01/25/23   Dale ,  MD  EPINEPHrine (EPIPEN 2-PAK) 0.3 mg/0.3 mL IJ SOAJ injection Inject 0.3 mg into the muscle as needed for anaphylaxis. 10/25/22   Dale Haleburg, MD  fluticasone (FLOVENT HFA) 110 MCG/ACT inhaler Use twice daily for 1 month then daily for 2 months. MUST SWALLOW, Do not eat or drink or rinse for 20 to 30 minutes thereafter. Then can rinse with warm water. 10/05/22   Lynann Bologna, MD  levothyroxine (SYNTHROID) 125 MCG tablet Take 1 tablet (125 mcg total) by mouth daily. 12/08/22   Dale Spofford, MD  pantoprazole (PROTONIX) 40 MG tablet Take 1 tablet (40 mg total) by mouth 2 (two) times daily. 01/04/23   Dale Noank, MD  promethazine-dextromethorphan (PROMETHAZINE-DM) 6.25-15 MG/5ML syrup Take 5 mLs by mouth at bedtime as needed for cough. 03/17/23   Carlisle Beers, FNP  Syringe/Needle, Disp, (SYRINGE 3CC/25GX1") 25G X 1" 3 ML MISC Use to give IM  B12 injection 10/26/22   Dale Lamboglia, MD    Family History Family History  Problem Relation Age of Onset   Rheum arthritis Mother    Arthritis Father    Hypertension Father    Sleep apnea Father    Transient ischemic attack Father    COPD Father    Alzheimer's disease Maternal Grandmother    Skin cancer Paternal Grandmother    Prostate cancer Paternal Grandfather    Breast cancer Paternal Aunt    Mental illness Paternal Uncle    Esophageal cancer Paternal Uncle    Colon cancer Neg Hx    Stomach cancer Neg Hx     Social History Social History   Tobacco Use   Smoking status: Never   Smokeless tobacco: Never  Vaping Use   Vaping status: Never Used  Substance Use Topics   Alcohol use: Yes    Alcohol/week: 0.0 standard drinks of alcohol   Drug use: No     Allergies   Tetracyclines & related   Review of Systems Review of Systems  Constitutional:  Positive for appetite change, fatigue and fever.  HENT:  Positive for congestion, ear pain, postnasal drip, rhinorrhea, sinus pain, sore throat and voice change. Negative for ear discharge and trouble swallowing.   Respiratory:  Positive for cough. Negative for shortness of breath and wheezing.   Cardiovascular:  Negative for chest pain.     Physical Exam Triage Vital Signs ED Triage Vitals  Encounter Vitals Group     BP 03/20/23 0810 128/79     Systolic BP Percentile --      Diastolic BP Percentile --      Pulse Rate 03/20/23 0810 (!) 104     Resp 03/20/23 0810 20     Temp 03/20/23 0810 98.8 F (37.1 C)     Temp Source 03/20/23 0810 Oral     SpO2 03/20/23 0810 97 %     Weight --      Height --      Head Circumference --      Peak Flow --      Pain Score 03/20/23 0809 6     Pain Loc --      Pain Education --      Exclude from Growth Chart --    No data found.  Updated Vital Signs BP 128/79 (BP Location: Right Arm)   Pulse (!) 104   Temp 98.8 F (37.1 C) (Oral)   Resp 20   LMP 03/01/2023 (Exact Date)    SpO2 97%   Visual Acuity Right Eye Distance:  Left Eye Distance:   Bilateral Distance:    Right Eye Near:   Left Eye Near:    Bilateral Near:     Physical Exam Vitals reviewed.  Constitutional:      Appearance: She is not ill-appearing or toxic-appearing.  HENT:     Head: Normocephalic and atraumatic.     Right Ear: Tympanic membrane is erythematous and bulging.     Left Ear: Tympanic membrane is bulging.     Ears:     Comments: Bilateral effusions    Nose: Congestion present. No rhinorrhea.     Right Sinus: No maxillary sinus tenderness or frontal sinus tenderness.     Left Sinus: No maxillary sinus tenderness or frontal sinus tenderness.     Mouth/Throat:     Mouth: Mucous membranes are moist.     Pharynx: No oropharyngeal exudate, posterior oropharyngeal erythema or uvula swelling.     Tonsils: No tonsillar exudate or tonsillar abscesses.     Comments: Hoarse voice Eyes:     General: Lids are normal.        Left eye: Discharge present.    Conjunctiva/sclera:     Right eye: Right conjunctiva is not injected.     Left eye: Left conjunctiva is injected. No chemosis or hemorrhage. Cardiovascular:     Comments: Repeat heart rate 93 Pulmonary:     Effort: Pulmonary effort is normal. No respiratory distress.     Breath sounds: No wheezing, rhonchi or rales.  Musculoskeletal:     Cervical back: Neck supple.  Lymphadenopathy:     Cervical: Cervical adenopathy (Mildly enlarged tender anterior nodes) present.  Skin:    General: Skin is warm.  Neurological:     Mental Status: She is alert and oriented to person, place, and time.  Psychiatric:        Mood and Affect: Mood normal.      UC Treatments / Results  Labs (all labs ordered are listed, but only abnormal results are displayed) Labs Reviewed - No data to display  EKG   Radiology No results found.  Procedures Procedures (including critical care time)  Medications Ordered in UC Medications - No data  to display  Initial Impression / Assessment and Plan / UC Course  I have reviewed the triage vital signs and the nursing notes.  Pertinent labs & imaging results that were available during my care of the patient were reviewed by me and considered in my medical decision making (see chart for details).     Bilateral 37 year old female sick for 6 days getting worse now having increased cough, chest congestion, new eye redness and drainage.  Reports fevers when she is not taking over-the-counter antipyretics. Has bilateral otitis media and conjunctivitis on exam, will treat with Augmentin, she was counseled to get a recheck if her symptoms worsen, continue over the essence for symptomatic relief Final Clinical Impressions(s) / UC Diagnoses   Final diagnoses:  None   Discharge Instructions   None    ED Prescriptions   None    PDMP not reviewed this encounter.   Meliton Rattan, [place] 03/20/23 [phone]

## 2023-03-21 ENCOUNTER — Telehealth: Payer: Self-pay | Admitting: *Deleted

## 2023-03-21 NOTE — Telephone Encounter (Signed)
Reason for CRM: Request for for X-ray as PT has had a cough since Dec 23 and been to urgent care two sperate times. PT is requesting to speak with Dr. Marina Goodell nurse as urgent care stated that if PT's cough didn't get better, she may need an xray.

## 2023-03-21 NOTE — Telephone Encounter (Signed)
Called patient about doing x-ray. She has decided she does not want to drive this far for x-ray and would like to give abx one more day. Will update me tomorrow.

## 2023-03-29 DIAGNOSIS — F4310 Post-traumatic stress disorder, unspecified: Secondary | ICD-10-CM | POA: Diagnosis not present

## 2023-04-01 ENCOUNTER — Ambulatory Visit: Payer: BC Managed Care – PPO | Admitting: Nurse Practitioner

## 2023-04-07 DIAGNOSIS — F4310 Post-traumatic stress disorder, unspecified: Secondary | ICD-10-CM | POA: Diagnosis not present

## 2023-04-08 ENCOUNTER — Ambulatory Visit: Payer: BC Managed Care – PPO | Admitting: Internal Medicine

## 2023-04-12 ENCOUNTER — Encounter: Payer: Self-pay | Admitting: Allergy

## 2023-04-12 ENCOUNTER — Other Ambulatory Visit: Payer: Self-pay | Admitting: Allergy

## 2023-04-12 ENCOUNTER — Ambulatory Visit (INDEPENDENT_AMBULATORY_CARE_PROVIDER_SITE_OTHER): Payer: BC Managed Care – PPO | Admitting: Allergy

## 2023-04-12 VITALS — BP 110/82 | HR 90 | Resp 16

## 2023-04-12 DIAGNOSIS — K9049 Malabsorption due to intolerance, not elsewhere classified: Secondary | ICD-10-CM

## 2023-04-12 DIAGNOSIS — T7800XD Anaphylactic reaction due to unspecified food, subsequent encounter: Secondary | ICD-10-CM

## 2023-04-12 DIAGNOSIS — K200 Eosinophilic esophagitis: Secondary | ICD-10-CM | POA: Diagnosis not present

## 2023-04-12 DIAGNOSIS — T7800XA Anaphylactic reaction due to unspecified food, initial encounter: Secondary | ICD-10-CM

## 2023-04-12 MED ORDER — FLUTICASONE PROPIONATE HFA 110 MCG/ACT IN AERO: INHALATION_SPRAY | RESPIRATORY_TRACT | 5 refills | Status: DC

## 2023-04-12 NOTE — Telephone Encounter (Signed)
 Is this change okay?

## 2023-04-12 NOTE — Patient Instructions (Addendum)
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  Continue allergen avoidance measures.  - Continue Pantoprazole twice a day.  - Resume swallowed Flovent 1 puff twice a day.  Do not eat or drink for 30 minutes after use.   If symptoms increase then increase Flovent to 2 puffs swallowed twice a day.  If this does not improve symptoms let me know and will recommend other treatment options  - We have discussed other treatment options including Dupixent weekly injections for EOE management as well as Eohilia (swallowed budesonide in pre-made packet).   - Follow up with Dr. [name] as scheduled for monitoring.  Adverse food reaction  - Food allergy testing was positive to almond and egg  - Continue avoidance of almond and stove-top egg in diet.  Would also above banana and tomato as may be intolerant.  Banana unlikely oral allergy syndrome (see below)  - Have access to self-injectable epinephrine (Epipen or AuviQ) 0.3mg  at all times  - Follow emergency action plan in case of allergic reaction  - We have discussed the following in regards to foods:   Allergy: food allergy is when you have eaten a food, developed an allergic reaction after eating the food and have IgE to the food (positive food testing either by skin testing or blood testing).  Food allergy could lead to life threatening symptoms  Sensitivity: occurs when you have IgE to a food (positive food testing either by skin testing or blood testing) but is a food you eat without any issues.  This is not an allergy and we recommend keeping the food in the diet  Intolerance: this is when you have negative testing by either skin testing or blood testing thus not allergic but the food causes symptoms (like belly pain, bloating, diarrhea etc) with ingestion.  These foods should be avoided to prevent symptoms.     - The oral allergy syndrome (OAS) or pollen-food allergy syndrome (PFAS)  is a relatively common form of food allergy, particularly in adults. It typically occurs in people who have pollen allergies when the immune system "sees" proteins on the food that look like proteins on the pollen. This results in the allergy antibody (IgE) binding to the food instead of the pollen. Patients typically report itching and/or mild swelling of the mouth and throat immediately following ingestion of certain uncooked fruits (including nuts) or raw vegetables. Only a very small number of affected individuals experience systemic allergic reactions, such as anaphylaxis which occurs with true food allergies.   This is not an all encompassing chart but helpful to see the possible associations.    Follow-up in 6 months or sooner if needed

## 2023-04-12 NOTE — Progress Notes (Signed)
Follow-up Note  RE: Anna Vasquez MRN: [id number] DOB: 28-Nov-1985 Date of Office Visit: 04/12/2023   History of present illness: Anna Vasquez is a 38 y.o. female presenting today for follow-up of EOE and adverse food reaction.  She was last seen in the office on 11/23/22 by myself.  Discussed the use of AI scribe software for clinical note transcription with the patient, who gave verbal consent to proceed.   She reports a significant improvement in swallowing and EOE symptoms while on Flovent, but experienced a increased symptoms after running out of the medication a month ago. The patient also notes that she has not been taking Pantoprazole as regularly due to concurrent illnesses she had over the winter holiday season, which has led to a noticeable flare-up of symptoms.  Since she has better from those illnesses she is back on pantoprazole twice a day dosing.  She has an egg allergy that was diagnosed on testing which has worsened over time. Initially after her last visit with me she had an episode where she had a bite of egg and experienced nausea after consuming eggs.  But more recently, she had an anaphylactic reaction after inadvertently consuming egg noodles. This reaction was severe, causing immediate throat swelling and difficulty breathing, requiring the use of a rescue inhaler that was her daughters as she was not able to find her epinephrine device.  She was able to locate her epinephrine device and keep that with her now. The patient now strictly avoids eggs due to this incident.  She is wondering if baked egg products could be causing issues as she has noted some digestive like heartburn problems when she has consumed baked egg.  She has been managing these symptoms with Tums on top of the pantoprazole. However she is not on her Flovent at this point in time.  Review of systems: 10pt ROS negative unless noted above in HPI  All other systems negative unless noted above in  HPI  Past medical/social/surgical/family history have been reviewed and are unchanged unless specifically indicated below.  No changes  Medication List: Current Outpatient Medications  Medication Sig Dispense Refill   acetaminophen (TYLENOL) 325 MG tablet Take by mouth.     Cyanocobalamin (VITAMIN B-12 PO) Take by mouth.     EPINEPHrine (EPIPEN 2-PAK) 0.3 mg/0.3 mL IJ SOAJ injection Inject 0.3 mg into the muscle as needed for anaphylaxis. 2 each 0   fluticasone (FLOVENT HFA) 110 MCG/ACT inhaler Use twice daily for 1 month then daily for 2 months. MUST SWALLOW, Do not eat or drink or rinse for 20 to 30 minutes thereafter. Then can rinse with warm water. 1 each 3   levothyroxine (SYNTHROID) 125 MCG tablet Take 1 tablet (125 mcg total) by mouth daily. 90 tablet 3   pantoprazole (PROTONIX) 40 MG tablet Take 1 tablet (40 mg total) by mouth 2 (two) times daily. 180 tablet 1   VITAMIN D PO Take by mouth.     No current facility-administered medications for this visit.     Known medication allergies: Allergies  Allergen Reactions   Tetracyclines & Related Other (See Comments)    Has had a Pseudotumor cerebri, was advised to list this medication as contraindicated      Physical examination: Blood pressure 110/82, pulse 90, resp. rate 16, last menstrual period 03/01/2023, SpO2 99%, unknown if currently breastfeeding.  General: Alert, interactive, in no acute distress. HEENT: PERRLA, TMs pearly gray, turbinates non-edematous without discharge, post-pharynx non erythematous. Neck:  Supple without lymphadenopathy. Lungs: Clear to auscultation without wheezing, rhonchi or rales. {no increased work of breathing. CV: Normal S1, S2 without murmurs. Abdomen: Nondistended, nontender. Skin: Warm and dry, without lesions or rashes. Extremities:  No clubbing, cyanosis or edema. Neuro:   Grossly intact.  Diagnositics/Labs: None today  Assessment and plan:   EOE Anna Vasquez has concomitant perennial  and seasonal allergy sensitivity with grass pollen, mold and tobacco leaf.  Continue allergen avoidance measures.  - Continue Pantoprazole twice a day.  - Resume swallowed Flovent 1 puff twice a day.  Do not eat or drink for 30 minutes after use.   If symptoms increase then increase Flovent to 2 puffs swallowed twice a day.  If this does not improve symptoms let me know and will recommend other treatment options  - We have discussed other treatment options including Dupixent weekly injections for EOE management as well as Eohilia (swallowed budesonide in pre-made packet).   - Follow up with Dr. [name] as scheduled for monitoring.  Adverse food reaction  - Food allergy testing was positive to almond and egg  - Continue avoidance of almond and stove-top egg in diet.  Would also above banana and tomato as may be intolerant.  Banana unlikely oral allergy syndrome (see below)  - Have access to self-injectable epinephrine (Epipen or AuviQ) 0.3mg  at all times  - Follow emergency action plan in case of allergic reaction  - We have discussed the following in regards to foods:   Allergy: food allergy is when you have eaten a food, developed an allergic reaction after eating the food and have IgE to the food (positive food testing either by skin testing or blood testing).  Food allergy could lead to life threatening symptoms  Sensitivity: occurs when you have IgE to a food (positive food testing either by skin testing or blood testing) but is a food you eat without any issues.  This is not an allergy and we recommend keeping the food in the diet  Intolerance: this is when you have negative testing by either skin testing or blood testing thus not allergic but the food causes symptoms (like belly pain, bloating, diarrhea etc) with ingestion.  These foods should be avoided to prevent symptoms.     - The oral allergy syndrome (OAS) or pollen-food allergy syndrome (PFAS) is a relatively common form of food  allergy, particularly in adults. It typically occurs in people who have pollen allergies when the immune system "sees" proteins on the food that look like proteins on the pollen. This results in the allergy antibody (IgE) binding to the food instead of the pollen. Patients typically report itching and/or mild swelling of the mouth and throat immediately following ingestion of certain uncooked fruits (including nuts) or raw vegetables. Only a very small number of affected individuals experience systemic allergic reactions, such as anaphylaxis which occurs with true food allergies.   This is not an all encompassing chart but helpful to see the possible associations.  Follow-up in 6 months or sooner if needed  I appreciate the opportunity to take part in Nai's care. Please do not hesitate to contact me with questions.  Sincerely,   Margo Aye, MD Allergy/Immunology [facility]

## 2023-04-13 ENCOUNTER — Ambulatory Visit: Payer: BC Managed Care – PPO | Admitting: Internal Medicine

## 2023-04-18 ENCOUNTER — Other Ambulatory Visit: Payer: Self-pay

## 2023-04-18 NOTE — Telephone Encounter (Signed)
Looks like Asmanex HFA might be covered.  Please advise of strength and dosing.

## 2023-04-20 ENCOUNTER — Telehealth: Payer: Self-pay

## 2023-04-20 NOTE — Telephone Encounter (Signed)
Received message from pharmacy saying fluticasone HFA not covered by insurance.  Per Dr. Delorse Lek, can try Asmanex HFA 2 puffs swallowed twice a day.   Sent new ERX to pharmacy and notified patient of change in medication. Also encouraged patient to check out Asmanex website for coupon.

## 2023-04-21 DIAGNOSIS — F4310 Post-traumatic stress disorder, unspecified: Secondary | ICD-10-CM | POA: Diagnosis not present

## 2023-05-05 DIAGNOSIS — F4310 Post-traumatic stress disorder, unspecified: Secondary | ICD-10-CM | POA: Diagnosis not present

## 2023-05-19 DIAGNOSIS — F4310 Post-traumatic stress disorder, unspecified: Secondary | ICD-10-CM | POA: Diagnosis not present

## 2023-06-02 DIAGNOSIS — F4310 Post-traumatic stress disorder, unspecified: Secondary | ICD-10-CM | POA: Diagnosis not present

## 2023-06-20 ENCOUNTER — Encounter: Payer: Self-pay | Admitting: Internal Medicine

## 2023-06-20 ENCOUNTER — Ambulatory Visit (INDEPENDENT_AMBULATORY_CARE_PROVIDER_SITE_OTHER): Payer: BC Managed Care – PPO | Admitting: Internal Medicine

## 2023-06-20 VITALS — BP 116/78 | HR 76 | Temp 98.4°F | Ht 66.0 in | Wt 292.4 lb

## 2023-06-20 DIAGNOSIS — M722 Plantar fascial fibromatosis: Secondary | ICD-10-CM

## 2023-06-20 DIAGNOSIS — J3089 Other allergic rhinitis: Secondary | ICD-10-CM

## 2023-06-20 DIAGNOSIS — K200 Eosinophilic esophagitis: Secondary | ICD-10-CM | POA: Diagnosis not present

## 2023-06-20 DIAGNOSIS — D649 Anemia, unspecified: Secondary | ICD-10-CM | POA: Diagnosis not present

## 2023-06-20 DIAGNOSIS — T7840XS Allergy, unspecified, sequela: Secondary | ICD-10-CM

## 2023-06-20 DIAGNOSIS — M79671 Pain in right foot: Secondary | ICD-10-CM

## 2023-06-20 DIAGNOSIS — K219 Gastro-esophageal reflux disease without esophagitis: Secondary | ICD-10-CM

## 2023-06-20 DIAGNOSIS — E039 Hypothyroidism, unspecified: Secondary | ICD-10-CM

## 2023-06-20 NOTE — Progress Notes (Signed)
 Subjective:    Patient ID: Anna Vasquez, female    DOB: 05/21/1985, 38 y.o.   MRN: [id number]  Patient here for  Chief Complaint  Patient presents with   Medical Management of Chronic Issues    HPI Here for a scheduled follow up. Has been taking protonix for GERD.  When stopped - had immediate return of symptoms. Saw GI 07/19/22 - recommended EGD and colonoscopy - both performed 09/21/22. EGD - esophagitis, esophageal stenosis, gastritis and small hiatal hernia. Colonoscopy - non bleeding internal hemorrhoids otherwise normal. Biopsy c/w EOE. Recommended to start flovent inhalers and referral to allergist. Evaluated by an allergist 11/23/22.  Recommended to continue to follow and continue above recommendations. Had f/u with allergist 04/12/23. Recommended continuing protonix bid. Recommended resuming swallowed flovent - per note. Food testing - positive to almond and egg. Recent reaction after eating egg noodles. She was also questioning alcohol intolerance. Does not drink often. Persistent right foot pain.    Past Medical History:  Diagnosis Date   Eosinophilic esophagitis    Frequent headaches    GERD (gastroesophageal reflux disease)    H/O febrile seizure    Hx of migraines    Hypothyroidism    Hypothyroidism    PCOD (polycystic ovarian disease)    Pseudotumor cerebri    Thoracic outlet syndrome    extra cervical ribs   Vitamin D deficiency    Past Surgical History:  Procedure Laterality Date   CARPAL TUNNEL RELEASE     rt and left   LAPAROSCOPIC GASTRIC SLEEVE RESECTION     MOUTH SURGERY  1998   UPPER GASTROINTESTINAL ENDOSCOPY     Family History  Problem Relation Age of Onset   Rheum arthritis Mother    Arthritis Father    Hypertension Father    Sleep apnea Father    Transient ischemic attack Father    COPD Father    Alzheimer's disease Maternal Grandmother    Skin cancer Paternal Grandmother    Prostate cancer Paternal Grandfather    Breast cancer Paternal Aunt     Mental illness Paternal Uncle    Esophageal cancer Paternal Uncle    Colon cancer Neg Hx    Stomach cancer Neg Hx    Social History   Socioeconomic History   Marital status: Married    Spouse name: Not on file   Number of children: Not on file   Years of education: Not on file   Highest education level: Master's degree (e.g., MA, MS, MEng, MEd, MSW, MBA)  Occupational History   Not on file  Tobacco Use   Smoking status: Never   Smokeless tobacco: Never  Vaping Use   Vaping status: Never Used  Substance and Sexual Activity   Alcohol use: Yes    Alcohol/week: 0.0 standard drinks of alcohol   Drug use: No   Sexual activity: Yes  Other Topics Concern   Not on file  Social History Narrative   Not on file   Social Drivers of Health   Financial Resource Strain: Low Risk  (06/19/2023)   Overall Financial Resource Strain (CARDIA)    Difficulty of Paying Living Expenses: Not very hard  Food Insecurity: No Food Insecurity (06/19/2023)   Hunger Vital Sign    Worried About Running Out of Food in the Last Year: Never true    Ran Out of Food in the Last Year: Never true  Transportation Needs: No Transportation Needs (06/19/2023)   PRAPARE - Transportation  Lack of Transportation (Medical): No    Lack of Transportation (Non-Medical): No  Physical Activity: Insufficiently Active (06/19/2023)   Exercise Vital Sign    Days of Exercise per Week: 3 days    Minutes of Exercise per Session: 30 min  Stress: Stress Concern Present (06/19/2023)   [employer] of Occupational Health - Occupational Stress Questionnaire    Feeling of Stress : To some extent  Social Connections: Moderately Integrated (06/19/2023)   Social Connection and Isolation Panel [NHANES]    Frequency of Communication with Friends and Family: Never    Frequency of Social Gatherings with Friends and Family: Once a week    Attends Religious Services: More than 4 times per year    Active Member of [employer] or  Organizations: Yes    Attends Engineer, structural: More than 4 times per year    Marital Status: Married     Review of Systems  Constitutional:  Negative for appetite change and unexpected weight change.  HENT:  Negative for congestion and sinus pressure.   Respiratory:  Negative for cough, chest tightness and shortness of breath.   Cardiovascular:  Negative for chest pain, palpitations and leg swelling.  Gastrointestinal:  Negative for abdominal pain and diarrhea.       No vomiting or nausea currently.   Genitourinary:  Negative for difficulty urinating and dysuria.  Musculoskeletal:  Negative for joint swelling, myalgias and neck pain.  Skin:  Negative for color change and rash.  Neurological:  Negative for dizziness and headaches.  Psychiatric/Behavioral:  Negative for agitation and dysphoric mood.        Objective:     BP 116/78   Pulse 76   Temp 98.4 F (36.9 C) (Oral)   Ht 5\' 6"  (1.676 m)   Wt 292 lb 6.4 oz (132.6 kg)   LMP 06/20/2023 (Exact Date)   SpO2 99%   BMI 47.19 kg/m  Wt Readings from Last 3 Encounters:  06/20/23 292 lb 6.4 oz (132.6 kg)  01/04/23 291 lb (132 kg)  11/23/22 288 lb 12.8 oz (131 kg)    Physical Exam Vitals reviewed.  Constitutional:      General: She is not in acute distress.    Appearance: Normal appearance.  HENT:     Head: Normocephalic and atraumatic.     Right Ear: External ear normal.     Left Ear: External ear normal.     Mouth/Throat:     Pharynx: No oropharyngeal exudate or posterior oropharyngeal erythema.  Eyes:     General: No scleral icterus.       Right eye: No discharge.        Left eye: No discharge.     Conjunctiva/sclera: Conjunctivae normal.  Neck:     Thyroid: No thyromegaly.  Cardiovascular:     Rate and Rhythm: Normal rate and regular rhythm.  Pulmonary:     Effort: No respiratory distress.     Breath sounds: Normal breath sounds. No wheezing.  Abdominal:     General: Bowel sounds are normal.      Palpations: Abdomen is soft.     Tenderness: There is no abdominal tenderness.  Musculoskeletal:        General: No swelling or tenderness.     Cervical back: Neck supple. No tenderness.  Lymphadenopathy:     Cervical: No cervical adenopathy.  Skin:    Findings: No erythema or rash.  Neurological:     Mental Status: She is alert.  Psychiatric:  Mood and Affect: Mood normal.        Behavior: Behavior normal.         Outpatient Encounter Medications as of 06/20/2023  Medication Sig   acetaminophen (TYLENOL) 325 MG tablet Take by mouth.   Cyanocobalamin (VITAMIN B-12 PO) Take by mouth.   EPINEPHrine (EPIPEN 2-PAK) 0.3 mg/0.3 mL IJ SOAJ injection Inject 0.3 mg into the muscle as needed for anaphylaxis.   levothyroxine (SYNTHROID) 125 MCG tablet Take 1 tablet (125 mcg total) by mouth daily.   pantoprazole (PROTONIX) 40 MG tablet Take 1 tablet (40 mg total) by mouth 2 (two) times daily.   VITAMIN D PO Take by mouth.   Mometasone Furoate (ASMANEX HFA) 200 MCG/ACT AERO Use two puffs and swallow twice daily as directed.  Do not eat or drink or rinse for 20 to 30 minutes thereafter. (Patient not taking: Reported on 06/20/2023)   No facility-administered encounter medications on file as of 06/20/2023.     Lab Results  Component Value Date   WBC 7.5 10/25/2022   HGB 11.5 (L) 10/25/2022   HCT 36.6 10/25/2022   PLT 226.0 10/25/2022   GLUCOSE 86 10/25/2022   CHOL 166 05/21/2022   TRIG 66 05/21/2022   HDL 73 05/21/2022   LDLCALC 80 05/21/2022   ALT 13 07/19/2022   AST 19 07/19/2022   NA 138 10/25/2022   K 4.4 10/25/2022   CL 105 10/25/2022   CREATININE 0.72 10/25/2022   BUN 11 10/25/2022   CO2 25 10/25/2022   TSH 1.09 10/25/2022   INR 1.0 08/25/2012   HGBA1C 5.2 08/25/2012       Assessment & Plan:  Anemia, unspecified type Assessment & Plan: Check cbc.   Orders: -     CBC with Differential/Platelet -     IBC + Ferritin -     Iron, TIBC and Ferritin  Panel  Eosinophilic esophagitis Assessment & Plan:  Saw GI 07/19/22 - recommended EGD and colonoscopy - both performed 09/21/22. EGD - esophagitis, esophageal stenosis, gastritis and small hiatal hernia. Colonoscopy - non bleeding internal hemorrhoids otherwise normal. Biopsy c/w EOE. Recommended flovent inhalers. Seeing an allergist as outlined. Continue PPI. Taking bid and using inhalers.    Orders: -     Comprehensive metabolic panel with GFR -     Pulmonary Visit  Hypothyroidism, unspecified type Assessment & Plan: On synthroid.  Follow tsh.   Orders: -     TSH  Gastroesophageal reflux disease, unspecified whether esophagitis present Assessment & Plan:  Saw GI 07/19/22 - recommended EGD and colonoscopy - both performed 09/21/22. EGD - esophagitis, esophageal stenosis, gastritis and small hiatal hernia. Colonoscopy - non bleeding internal hemorrhoids otherwise normal. Biopsy c/w EOE. Recommended flovent inhalers. Seeing an allergist.  Continue PPI. Taking bid and using inhalers.     Plantar fasciitis Assessment & Plan: Plantar fasciitis - podiatry (10/2022) - night splint, orthotics, injection. F/u 11/2022 - second injection. Persistent right foot pain. Request referral to [facility] - foot specialist.   Orders: -     Ambulatory referral to Orthopedic Surgery  Allergy, sequela Assessment & Plan: Allergies as outlined. Intermittent episodes affecting her breathing - congestion. Discussed referral to pulmonary for further evaluation and question of need for PFTs, etc.   Orders: -     Pulmonary Visit  Right foot pain -     Ambulatory referral to Orthopedic Surgery     Dale Omro, MD

## 2023-06-25 ENCOUNTER — Encounter: Payer: Self-pay | Admitting: Internal Medicine

## 2023-06-25 DIAGNOSIS — T7840XA Allergy, unspecified, initial encounter: Secondary | ICD-10-CM | POA: Insufficient documentation

## 2023-06-25 NOTE — Assessment & Plan Note (Signed)
 Saw GI 07/19/22 - recommended EGD and colonoscopy - both performed 09/21/22. EGD - esophagitis, esophageal stenosis, gastritis and small hiatal hernia. Colonoscopy - non bleeding internal hemorrhoids otherwise normal. Biopsy c/w EOE. Recommended flovent inhalers. Seeing an allergist as outlined. Continue PPI. Taking bid and using inhalers.

## 2023-06-25 NOTE — Assessment & Plan Note (Signed)
 Check cbc

## 2023-06-25 NOTE — Assessment & Plan Note (Signed)
On synthroid.  Follow tsh.   

## 2023-06-25 NOTE — Assessment & Plan Note (Signed)
 Saw GI 07/19/22 - recommended EGD and colonoscopy - both performed 09/21/22. EGD - esophagitis, esophageal stenosis, gastritis and small hiatal hernia. Colonoscopy - non bleeding internal hemorrhoids otherwise normal. Biopsy c/w EOE. Recommended flovent inhalers. Seeing an allergist.  Continue PPI. Taking bid and using inhalers.

## 2023-06-25 NOTE — Assessment & Plan Note (Signed)
 Allergies as outlined. Intermittent episodes affecting her breathing - congestion. Discussed referral to pulmonary for further evaluation and question of need for PFTs, etc.

## 2023-06-25 NOTE — Assessment & Plan Note (Signed)
 Plantar fasciitis - podiatry (10/2022) - night splint, orthotics, injection. F/u 11/2022 - second injection. Persistent right foot pain. Request referral to [facility] - foot specialist.

## 2023-06-30 DIAGNOSIS — F4310 Post-traumatic stress disorder, unspecified: Secondary | ICD-10-CM | POA: Diagnosis not present

## 2023-06-30 DIAGNOSIS — D649 Anemia, unspecified: Secondary | ICD-10-CM | POA: Diagnosis not present

## 2023-06-30 DIAGNOSIS — M6701 Short Achilles tendon (acquired), right ankle: Secondary | ICD-10-CM | POA: Diagnosis not present

## 2023-06-30 DIAGNOSIS — M722 Plantar fascial fibromatosis: Secondary | ICD-10-CM | POA: Diagnosis not present

## 2023-06-30 DIAGNOSIS — K200 Eosinophilic esophagitis: Secondary | ICD-10-CM | POA: Diagnosis not present

## 2023-06-30 DIAGNOSIS — E039 Hypothyroidism, unspecified: Secondary | ICD-10-CM | POA: Diagnosis not present

## 2023-07-01 LAB — COMPREHENSIVE METABOLIC PANEL WITH GFR
ALT: 17 IU/L (ref 0–32)
AST: 19 IU/L (ref 0–40)
Albumin: 4.4 g/dL (ref 3.9–4.9)
Alkaline Phosphatase: 87 IU/L (ref 44–121)
BUN/Creatinine Ratio: 16 (ref 9–23)
BUN: 12 mg/dL (ref 6–20)
Bilirubin Total: 0.7 mg/dL (ref 0.0–1.2)
CO2: 22 mmol/L (ref 20–29)
Calcium: 9.3 mg/dL (ref 8.7–10.2)
Chloride: 101 mmol/L (ref 96–106)
Creatinine, Ser: 0.73 mg/dL (ref 0.57–1.00)
Globulin, Total: 2.8 g/dL (ref 1.5–4.5)
Glucose: 81 mg/dL (ref 70–99)
Potassium: 4.6 mmol/L (ref 3.5–5.2)
Sodium: 138 mmol/L (ref 134–144)
Total Protein: 7.2 g/dL (ref 6.0–8.5)
eGFR: 108 mL/min/{1.73_m2} (ref >59)

## 2023-07-01 LAB — CBC WITH DIFFERENTIAL/PLATELET
Basophils Absolute: 0 10*3/uL (ref 0.0–0.2)
Basos: 1 %
EOS (ABSOLUTE): 0.1 10*3/uL (ref 0.0–0.4)
Eos: 1 %
Hematocrit: 37.9 % (ref 34.0–46.6)
Hemoglobin: 11.9 g/dL (ref 11.1–15.9)
Immature Grans (Abs): 0 10*3/uL (ref 0.0–0.1)
Immature Granulocytes: 0 %
Lymphocytes Absolute: 1.8 10*3/uL (ref 0.7–3.1)
Lymphs: 23 %
MCH: 26 pg — ABNORMAL LOW (ref 26.6–33.0)
MCHC: 31.4 g/dL — ABNORMAL LOW (ref 31.5–35.7)
MCV: 83 fL (ref 79–97)
Monocytes Absolute: 0.4 10*3/uL (ref 0.1–0.9)
Monocytes: 4 %
Neutrophils Absolute: 5.7 10*3/uL (ref 1.4–7.0)
Neutrophils: 71 %
Platelets: 298 10*3/uL (ref 150–450)
RBC: 4.57 x10E6/uL (ref 3.77–5.28)
RDW: 14.3 % (ref 11.7–15.4)
WBC: 8 10*3/uL (ref 3.4–10.8)

## 2023-07-01 LAB — IRON,TIBC AND FERRITIN PANEL
Ferritin: 12 ng/mL — ABNORMAL LOW (ref 15–150)
Iron Saturation: 18 % (ref 15–55)
Iron: 72 ug/dL (ref 27–159)
Total Iron Binding Capacity: 390 ug/dL (ref 250–450)
UIBC: 318 ug/dL (ref 131–425)

## 2023-07-01 LAB — TSH: TSH: 2.16 u[IU]/mL (ref 0.450–4.500)

## 2023-07-04 ENCOUNTER — Telehealth: Payer: Self-pay | Admitting: Internal Medicine

## 2023-07-04 DIAGNOSIS — D649 Anemia, unspecified: Secondary | ICD-10-CM

## 2023-07-04 NOTE — Telephone Encounter (Signed)
 Order placed for future labs to be drawn [employer] [employer].

## 2023-07-11 ENCOUNTER — Other Ambulatory Visit: Payer: Self-pay | Admitting: Allergy

## 2023-07-11 DIAGNOSIS — K200 Eosinophilic esophagitis: Secondary | ICD-10-CM

## 2023-07-14 DIAGNOSIS — F4310 Post-traumatic stress disorder, unspecified: Secondary | ICD-10-CM | POA: Diagnosis not present

## 2023-07-20 ENCOUNTER — Encounter: Payer: Self-pay | Admitting: Pulmonary Disease

## 2023-07-20 ENCOUNTER — Ambulatory Visit: Admitting: Pulmonary Disease

## 2023-07-20 VITALS — BP 126/84 | HR 70 | Temp 97.6°F | Ht 66.0 in | Wt 292.6 lb

## 2023-07-20 DIAGNOSIS — K200 Eosinophilic esophagitis: Secondary | ICD-10-CM

## 2023-07-20 DIAGNOSIS — K219 Gastro-esophageal reflux disease without esophagitis: Secondary | ICD-10-CM | POA: Diagnosis not present

## 2023-07-20 DIAGNOSIS — R053 Chronic cough: Secondary | ICD-10-CM

## 2023-07-20 MED ORDER — BENZONATATE 200 MG PO CAPS: 200.0000 mg | ORAL_CAPSULE | Freq: Three times a day (TID) | ORAL | 0 refills | Status: DC | PRN

## 2023-07-20 MED ORDER — ALBUTEROL SULFATE HFA 108 (90 BASE) MCG/ACT IN AERS: 2.0000 | INHALATION_SPRAY | Freq: Four times a day (QID) | RESPIRATORY_TRACT | 6 refills | Status: DC | PRN

## 2023-07-20 MED ORDER — FLUTICASONE FUROATE-VILANTEROL 100-25 MCG/ACT IN AEPB: 1.0000 | INHALATION_SPRAY | Freq: Every day | RESPIRATORY_TRACT | 1 refills | Status: DC

## 2023-07-20 NOTE — Progress Notes (Signed)
 Anna Vasquez    [id number]    February 14, 1986  Primary Care Physician:Scott, [place], MD  Referring Physician: Dellar Fenton, MD [address] [place],  [place] [address]  Chief complaint:   Cough, shortness of breath  HPI:  Patient being seen for cough shortness of breath  Diagnosed with a lot of allergens recently Allergies to eggs, pollen some other environmental triggers Not allergic to dogs - Does have a pet dog  Never smoker  Works from home  Had a pneumonia in October, December January had another respiratory tract infection that left her with a cough and shortness of breath This recurred it began about 2 weeks ago  Was diagnosed with eosinophilic esophagitis in July 2024 - On PPI, swallowed steroid inhaler-was using Flovent  for a while and now Asmanex Esophagitis symptoms well-controlled at present  She has a daughter who has asthma, no other family members with asthma known to her  She gets short of breath with moderate exertion  Cough comes and goes Talking, walking, exercise makes the coughing worse  No chest pains or chest discomfort   Outpatient Encounter Medications as of 07/20/2023  Medication Sig   acetaminophen  (TYLENOL ) 325 MG tablet Take by mouth.   albuterol (VENTOLIN HFA) 108 (90 Base) MCG/ACT inhaler Inhale 2 puffs into the lungs every 6 (six) hours as needed for wheezing or shortness of breath.   benzonatate (TESSALON) 200 MG capsule Take 1 capsule (200 mg total) by mouth 3 (three) times daily as needed.   Cyanocobalamin  (VITAMIN B-12 PO) Take by mouth.   EPINEPHrine  (EPIPEN  2-PAK) 0.3 mg/0.3 mL IJ SOAJ injection Inject 0.3 mg into the muscle as needed for anaphylaxis.   fluticasone  furoate-vilanterol (BREO ELLIPTA) 100-25 MCG/ACT AEPB Inhale 1 puff into the lungs daily.   levothyroxine  (SYNTHROID ) 125 MCG tablet Take 1 tablet (125 mcg total) by mouth daily.   Mometasone Furoate (ASMANEX HFA) 200 MCG/ACT AERO  INHALE 2 PUFFS & SWALLOW TWICE DAILY AS DIRECTED. DO NOT EAT, DRINK OR RINSE FOR 20 TO 30 MINUTES THEREAFTER.   pantoprazole  (PROTONIX ) 40 MG tablet Take 1 tablet (40 mg total) by mouth 2 (two) times daily.   VITAMIN D  PO Take by mouth.   No facility-administered encounter medications on file as of 07/20/2023.    Allergies as of 07/20/2023 - Review Complete 07/20/2023  Allergen Reaction Noted   Tetracyclines & related Other (See Comments) 09/27/2012    Past Medical History:  Diagnosis Date   Eosinophilic esophagitis    Frequent headaches    GERD (gastroesophageal reflux disease)    H/O febrile seizure    Hx of migraines    Hypothyroidism    Hypothyroidism    PCOD (polycystic ovarian disease)    Pseudotumor cerebri    Thoracic outlet syndrome    extra cervical ribs   Vitamin D  deficiency     Past Surgical History:  Procedure Laterality Date   CARPAL TUNNEL RELEASE     rt and left   LAPAROSCOPIC GASTRIC SLEEVE RESECTION     MOUTH SURGERY  1998   UPPER GASTROINTESTINAL ENDOSCOPY      Family History  Problem Relation Age of Onset   Rheum arthritis Mother    Arthritis Father    Hypertension Father    Sleep apnea Father    Transient ischemic attack Father    COPD Father    Alzheimer's disease Maternal Grandmother    Skin cancer Paternal Grandmother  Prostate cancer Paternal Grandfather    Breast cancer Paternal Aunt    Mental illness Paternal Uncle    Esophageal cancer Paternal Uncle    Colon cancer Neg Hx    Stomach cancer Neg Hx     Social History   Socioeconomic History   Marital status: Married    Spouse name: Not on file   Number of children: Not on file   Years of education: Not on file   Highest education level: Master's degree (e.g., MA, MS, MEng, MEd, MSW, MBA)  Occupational History   Not on file  Tobacco Use   Smoking status: Never   Smokeless tobacco: Never  Vaping Use   Vaping status: Never Used  Substance and Sexual Activity   Alcohol  use: Yes    Alcohol/week: 0.0 standard drinks of alcohol   Drug use: No   Sexual activity: Yes  Other Topics Concern   Not on file  Social History Narrative   Not on file   Social Drivers of Health   Financial Resource Strain: Low Risk  (06/19/2023)   Overall Financial Resource Strain (CARDIA)    Difficulty of Paying Living Expenses: Not very hard  Food Insecurity: No Food Insecurity (06/19/2023)   Hunger Vital Sign    Worried About Running Out of Food in the Last Year: Never true    Ran Out of Food in the Last Year: Never true  Transportation Needs: No Transportation Needs (06/19/2023)   PRAPARE - Administrator, Civil Service (Medical): No    Lack of Transportation (Non-Medical): No  Physical Activity: Insufficiently Active (06/19/2023)   Exercise Vital Sign    Days of Exercise per Week: 3 days    Minutes of Exercise per Session: 30 min  Stress: Stress Concern Present (06/19/2023)   [employer] of Occupational Health - Occupational Stress Questionnaire    Feeling of Stress : To some extent  Social Connections: Moderately Integrated (06/19/2023)   Social Connection and Isolation Panel [NHANES]    Frequency of Communication with Friends and Family: Never    Frequency of Social Gatherings with Friends and Family: Once a week    Attends Religious Services: More than 4 times per year    Active Member of [employer] or Organizations: Yes    Attends Engineer, structural: More than 4 times per year    Marital Status: Married  Catering manager Violence: Not At Risk (02/23/2019)   Humiliation, Afraid, Rape, and Kick questionnaire    Fear of Current or Ex-Partner: No    Emotionally Abused: No    Physically Abused: No    Sexually Abused: No    Review of Systems  Respiratory:  Positive for cough and shortness of breath.     Vitals:   07/20/23 1303  BP: 126/84  Pulse: 70  Temp: 97.6 F (36.4 C)  SpO2: 99%     Physical Exam Constitutional:      Appearance:  She is obese.  HENT:     Head: Normocephalic.     Mouth/Throat:     Mouth: Mucous membranes are moist.  Eyes:     General: No scleral icterus. Cardiovascular:     Rate and Rhythm: Normal rate and regular rhythm.     Heart sounds: No murmur heard.    No friction rub.  Pulmonary:     Effort: No respiratory distress.     Breath sounds: No stridor. No wheezing or rhonchi.  Musculoskeletal:     Cervical back:  No rigidity or tenderness.  Neurological:     Mental Status: She is alert.  Psychiatric:        Mood and Affect: Mood normal.    Data Reviewed: Fino testing was done today - Feno level of 5  Last chest x-ray 12/27/2022 with no acute infiltrate  Recent blood work did not show elevated eosinophil levels  Assessment:  Chronic cough  Multiple allergens  Eosinophilic esophagitis -On PPI, steroid-Asmanex which she swallows  Reflux - Remains on PPI  History of hypothyroidism, polycystic ovarian disease  Plan/Recommendations:  FeNo level today showing a level of 5 suggesting a level that is not consistent with significant inflammation in the airway, patient had used Asmanex today-this may have blunted the response Reason for Feno was concern for airway inflammation with a history of multiple allergies, eosinophilic esophagitis so there is a 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  Continue management for reflux  Prescription for Tessalon Perles to help with symptomatic management of a cough  Schedule for pulmonary function test  Discussed possibility of using Breo which I did send the prescription for but patient can alter what she is doing right now and use Asmanex as an inhaler instead of swallowing it-I did encourage her to bring this up with a GI doctor as well and follow-up  Prescription for albuterol also sent to pharmacy  Follow-up in about 6 weeks  Encouraged to call with significant concerns   Myer Artis MD [facility] 07/20/2023, 1:29 PM  CC: Dellar Fenton, MD

## 2023-07-20 NOTE — Patient Instructions (Signed)
 We will start you on an inhaler called Breo  Need to follow-up with GI to find out whether you can get off Asmanex since he does have a steroid inhaler as well  Need to make sure you are eosinophilic esophagitis is well treated as well  Behavioral modifications to help reflux of symptoms  Continue proton pump inhibitor  Prescription for albuterol to be used as needed  Prescription for Tessalon Perles for cough  We will schedule you for breathing study

## 2023-07-28 DIAGNOSIS — F4310 Post-traumatic stress disorder, unspecified: Secondary | ICD-10-CM | POA: Diagnosis not present

## 2023-08-25 DIAGNOSIS — F4310 Post-traumatic stress disorder, unspecified: Secondary | ICD-10-CM | POA: Diagnosis not present

## 2023-08-29 ENCOUNTER — Ambulatory Visit: Admitting: Pulmonary Disease

## 2023-08-30 ENCOUNTER — Ambulatory Visit: Payer: BC Managed Care – PPO | Admitting: Allergy

## 2023-08-30 ENCOUNTER — Encounter: Payer: Self-pay | Admitting: Allergy

## 2023-08-30 VITALS — BP 108/64 | HR 88 | Temp 98.2°F

## 2023-08-30 DIAGNOSIS — K200 Eosinophilic esophagitis: Secondary | ICD-10-CM | POA: Diagnosis not present

## 2023-08-30 DIAGNOSIS — T7800XA Anaphylactic reaction due to unspecified food, initial encounter: Secondary | ICD-10-CM

## 2023-08-30 DIAGNOSIS — T7800XD Anaphylactic reaction due to unspecified food, subsequent encounter: Secondary | ICD-10-CM

## 2023-08-30 DIAGNOSIS — K9049 Malabsorption due to intolerance, not elsewhere classified: Secondary | ICD-10-CM | POA: Diagnosis not present

## 2023-08-30 NOTE — Progress Notes (Signed)
 Follow-up Note  RE: Anna Vasquez MRN: [id number] DOB: 11-20-1985 Date of Office Visit: 08/30/2023   History of present illness: Anna Vasquez is a 38 y.o. female presenting today for follow-up of EOE, allergic rhinitis with conjunctivitis and reactions. She was last seen in the office on 04/12/23 by myself.  Discussed the use of AI scribe software for clinical note transcription with the patient, who gave verbal consent to proceed.  She has been experiencing challenges with her medication management for eosinophilic esophagitis. Her insurance does not cover Flovent , and there have been issues with coverage for Asmanex. She recently ran out of Asmanex about a week and a half ago and faced difficulties obtaining a refill due to insurance requirements for a three-month prescription, which was further complicated by the medication being out of stock.  This hopefully has been rectified.  She has not had any increase in his EOE symptoms since the last visit.  She does continue to take pantoprazole  as well. She continues to avoid almond and egg in the diet.  She is worried she has developed new allergens.  Since the last visit on at least 3 occasions now she has noticed issues after alcohol consumption.  Even with just 1 alcoholic beverage like a beer or wine.  She initially was noting migraines after alcoholic beverage which she normally does not have migraines.  The next time she consumed alcohol she had abdominal symptoms like intense abdominal pain and cramping.  The third episode she felt like she had food poisoning where she had vomiting, diarrhea, stomach burning sensation.  She also on occasion has noted some stuffiness and weird throat feeling with alcohol consumption.  She has essentially avoided alcohol in the diet.  She does have access to an epinephrine  device that she has used since the last visit.   Review of systems: 10pt ROS negative unless noted in HPI  Past  medical/social/surgical/family history have been reviewed and are unchanged unless specifically indicated below.  No changes  Medication List: Current Outpatient Medications  Medication Sig Dispense Refill   EPINEPHrine  (EPIPEN  2-PAK) 0.3 mg/0.3 mL IJ SOAJ injection Inject 0.3 mg into the muscle as needed for anaphylaxis. 2 each 0   levothyroxine  (SYNTHROID ) 125 MCG tablet Take 1 tablet (125 mcg total) by mouth daily. 90 tablet 3   Mometasone Furoate (ASMANEX HFA) 200 MCG/ACT AERO INHALE 2 PUFFS & SWALLOW TWICE DAILY AS DIRECTED. DO NOT EAT, DRINK OR RINSE FOR 20 TO 30 MINUTES THEREAFTER. 39 g 0   pantoprazole  (PROTONIX ) 40 MG tablet Take 1 tablet (40 mg total) by mouth 2 (two) times daily. 180 tablet 1   No current facility-administered medications for this visit.     Known medication allergies: Allergies  Allergen Reactions   Almond (Diagnostic)    Egg-Derived Products     Stove top egg   Tetracyclines & Related Other (See Comments)    Has had a Pseudotumor cerebri, was advised to list this medication as contraindicated      Physical examination: Blood pressure 108/64, pulse 88, temperature 98.2 F (36.8 C), temperature source Temporal, SpO2 97%, unknown if currently breastfeeding.  General: Alert, interactive, in no acute distress. HEENT: PERRLA, TMs pearly gray, turbinates non-edematous without discharge, post-pharynx non erythematous. Neck: Supple without lymphadenopathy. Lungs: Clear to auscultation without wheezing, rhonchi or rales. {no increased work of breathing. CV: Normal S1, S2 without murmurs. Abdomen: Nondistended, nontender. Skin: Warm and dry, without lesions or rashes. Extremities:  No clubbing, cyanosis  or edema. Neuro:   Grossly intact.  Diagnostics/Labs: None today  Assessment and plan: EOE Donte has concomitant perennial and seasonal allergy  sensitivity with grass pollen, mold and tobacco leaf.  Continue allergen avoidance measures.  - Continue  Pantoprazole  twice a day.  - Continue swallowed steroid for control with Asmanex 2 puffs swallowed 1-2 times a day.  Do not eat or drink for 30 minutes after use.   If symptoms increase then increase 2 puffs swallowed twice a day.  If this does not improve symptoms let me know and will recommend other treatment options. Plan if the Asmanex is not attainable then we will try to get Joli Neas approved (will arrange for sample access in case we need to initiate this medication)  - We have discussed other treatment options including Dupixent weekly injections for EOE management as well as Joli Neas (swallowed budesonide in pre-made packet) - will arrange to have Eohilia sample for pickup in [place] office.   - Follow up with Dr. Venice Gillis as scheduled for monitoring (at least once every 3 years to maintain as an established patient)  Adverse food reaction/Intolerance  - Food allergy  testing was positive to almond and egg and now alcohol  - Continue avoidance of almond and stove-top egg in diet.   Banana and tomato may be intolerance.  Banana we can see with oral allergy  syndrome. Alcohol we do not have any reliable test for.  Alcohol also can be histamine releasing which can lead to symptoms of allergic reactions.  Wine can cause symptoms related to sulfites.  If you would like you can try sulfite free wines and see if you tolerate.   Also with alcohol you may tolerate pre-medication with primary antihistamine like Zyrtec with secondary antihistamine Pepcid to reduced histamine response.  Otherwise would avoid now.   - Have access to self-injectable epinephrine  (Epipen  or AuviQ) 0.3mg  at all times  - Follow emergency action plan in case of allergic reaction  - We have discussed the following in regards to foods:   Allergy : food allergy  is when you have eaten a food, developed an allergic reaction after eating the food and have IgE to the food (positive food testing either by skin testing or blood testing).  Food  allergy  could lead to life threatening symptoms  Sensitivity: occurs when you have IgE to a food (positive food testing either by skin testing or blood testing) but is a food you eat without any issues.  This is not an allergy  and we recommend keeping the food in the diet  Intolerance: this is when you have negative testing by either skin testing or blood testing thus not allergic but the food causes symptoms (like belly pain, bloating, diarrhea etc) with ingestion.  These foods should be avoided to prevent symptoms.     - The oral allergy  syndrome (OAS) or pollen-food allergy  syndrome (PFAS) is a relatively common form of food allergy , particularly in adults. It typically occurs in people who have pollen allergies when the immune system "sees" proteins on the food that look like proteins on the pollen. This results in the allergy  antibody (IgE) binding to the food instead of the pollen. Patients typically report itching and/or mild swelling of the mouth and throat immediately following ingestion of certain uncooked fruits (including nuts) or raw vegetables. Only a very small number of affected individuals experience systemic allergic reactions, such as anaphylaxis which occurs with true food allergies.   This is not an all encompassing chart but helpful to  see the possible associations.  Follow-up in 6 months or sooner if needed  I appreciate the opportunity to take part in Donte's care. Please do not hesitate to contact me with questions.  Sincerely,   Catha Clink, MD Allergy /Immunology [facility]

## 2023-08-30 NOTE — Patient Instructions (Signed)
 EOE Aliahna has concomitant perennial and seasonal allergy  sensitivity with grass pollen, mold and tobacco leaf.  Continue allergen avoidance measures.  - Continue Pantoprazole  twice a day.  - Continue swallowed steroid for control with Asmanex 2 puffs swallowed 1-2 times a day.  Do not eat or drink for 30 minutes after use.   If symptoms increase then increase 2 puffs swallowed twice a day.  If this does not improve symptoms let me know and will recommend other treatment options  - We have discussed other treatment options including Dupixent weekly injections for EOE management as well as Joli Neas (swallowed budesonide in pre-made packet) - will arrange to have Eohilia sample for pickup in [place] office.   - Follow up with Dr. Venice Gillis as scheduled for monitoring (at least once every 3 years to maintain as an established patient)  Adverse food reaction  - Food allergy  testing was positive to almond and egg and now alcohol  - Continue avoidance of almond and stove-top egg in diet.   Banana and tomato may be intolerance.  Banana we can see with oral allergy  syndrome (see below) Alcohol we do not have any reliable test for.  Alcohol also can be histamine releasing which can lead to symptoms of allergic reactions.  Wine can cause symptoms related to sulfites.  If you would like you can try sulfite free wines and see if you tolerate.   Also with alcohol you may tolerate pre-medication with primary antihistamine like Zyrtec with secondary antihistamine Pepcid to reduced histamine response.  Otherwise would avoid now.   - Have access to self-injectable epinephrine  (Epipen  or AuviQ) 0.3mg  at all times  - Follow emergency action plan in case of allergic reaction  - We have discussed the following in regards to foods:   Allergy : food allergy  is when you have eaten a food, developed an allergic reaction after eating the food and have IgE to the food (positive food testing either by skin testing or blood testing).   Food allergy  could lead to life threatening symptoms  Sensitivity: occurs when you have IgE to a food (positive food testing either by skin testing or blood testing) but is a food you eat without any issues.  This is not an allergy  and we recommend keeping the food in the diet  Intolerance: this is when you have negative testing by either skin testing or blood testing thus not allergic but the food causes symptoms (like belly pain, bloating, diarrhea etc) with ingestion.  These foods should be avoided to prevent symptoms.     - The oral allergy  syndrome (OAS) or pollen-food allergy  syndrome (PFAS) is a relatively common form of food allergy , particularly in adults. It typically occurs in people who have pollen allergies when the immune system "sees" proteins on the food that look like proteins on the pollen. This results in the allergy  antibody (IgE) binding to the food instead of the pollen. Patients typically report itching and/or mild swelling of the mouth and throat immediately following ingestion of certain uncooked fruits (including nuts) or raw vegetables. Only a very small number of affected individuals experience systemic allergic reactions, such as anaphylaxis which occurs with true food allergies.   This is not an all encompassing chart but helpful to see the possible associations.    Follow-up in 6 months or sooner if needed

## 2023-09-08 DIAGNOSIS — F4310 Post-traumatic stress disorder, unspecified: Secondary | ICD-10-CM | POA: Diagnosis not present

## 2023-09-11 ENCOUNTER — Other Ambulatory Visit: Payer: Self-pay | Admitting: Internal Medicine

## 2023-09-22 DIAGNOSIS — F4310 Post-traumatic stress disorder, unspecified: Secondary | ICD-10-CM | POA: Diagnosis not present

## 2023-10-05 ENCOUNTER — Encounter

## 2023-10-05 ENCOUNTER — Ambulatory Visit: Admitting: Pulmonary Disease

## 2023-10-11 DIAGNOSIS — F4310 Post-traumatic stress disorder, unspecified: Secondary | ICD-10-CM | POA: Diagnosis not present

## 2023-10-21 ENCOUNTER — Encounter: Admitting: Internal Medicine

## 2023-11-17 DIAGNOSIS — F4310 Post-traumatic stress disorder, unspecified: Secondary | ICD-10-CM | POA: Diagnosis not present

## 2023-12-01 DIAGNOSIS — F4310 Post-traumatic stress disorder, unspecified: Secondary | ICD-10-CM | POA: Diagnosis not present

## 2023-12-14 ENCOUNTER — Other Ambulatory Visit: Payer: Self-pay | Admitting: Internal Medicine

## 2023-12-15 DIAGNOSIS — F4310 Post-traumatic stress disorder, unspecified: Secondary | ICD-10-CM | POA: Diagnosis not present

## 2023-12-21 ENCOUNTER — Telehealth: Payer: Self-pay | Admitting: Internal Medicine

## 2023-12-21 NOTE — Telephone Encounter (Signed)
 Left message and sent MyChart message:  Please call the office to reschedule your 01/09/2024 appointment with Dr Glendia to another day.  E2C2 please reschedule appt

## 2023-12-29 DIAGNOSIS — F4310 Post-traumatic stress disorder, unspecified: Secondary | ICD-10-CM | POA: Diagnosis not present

## 2024-01-09 ENCOUNTER — Encounter: Admitting: Internal Medicine

## 2024-01-26 DIAGNOSIS — F4310 Post-traumatic stress disorder, unspecified: Secondary | ICD-10-CM | POA: Diagnosis not present

## 2024-02-09 DIAGNOSIS — F4310 Post-traumatic stress disorder, unspecified: Secondary | ICD-10-CM | POA: Diagnosis not present

## 2024-02-28 ENCOUNTER — Ambulatory Visit: Admitting: Allergy

## 2024-02-28 ENCOUNTER — Encounter: Payer: Self-pay | Admitting: Allergy

## 2024-02-28 VITALS — BP 112/76 | HR 80 | Resp 16

## 2024-02-28 DIAGNOSIS — K200 Eosinophilic esophagitis: Secondary | ICD-10-CM | POA: Diagnosis not present

## 2024-02-28 DIAGNOSIS — T7800XD Anaphylactic reaction due to unspecified food, subsequent encounter: Secondary | ICD-10-CM | POA: Diagnosis not present

## 2024-02-28 DIAGNOSIS — K9049 Malabsorption due to intolerance, not elsewhere classified: Secondary | ICD-10-CM | POA: Diagnosis not present

## 2024-02-28 DIAGNOSIS — T7800XA Anaphylactic reaction due to unspecified food, initial encounter: Secondary | ICD-10-CM | POA: Diagnosis not present

## 2024-02-28 MED ORDER — NEFFY 2 MG/0.1ML NA SOLN: 1.0000 | NASAL | 3 refills | Status: DC | PRN

## 2024-02-28 NOTE — Patient Instructions (Addendum)
 EOE [place] has concomitant perennial and seasonal allergy  sensitivity with grass pollen, mold and tobacco leaf.  Continue allergen avoidance measures.  - Continue Pantoprazole  twice a day.  - Will call pharmacy to determine what option you have for EOE management with swallowed steroids if symptoms were to arise again  - We have discussed other treatment options including Dupixent weekly injections for EOE management  - Follow up with Dr. Charlanne as scheduled for monitoring (at least once every 3 years to maintain as an established patient)  Adverse food reaction  - Food allergy  testing was positive to almond and egg and alcohol  - Continue avoidance of almond and stove-top egg in diet.   Will obtain serum IgE levels to these foods to see if eligible for in-office challenge to determine if safe to eat.  Banana and tomato may be intolerance.  Banana we can see with oral allergy  syndrome (see below) Alcohol we do not have any reliable test for.  Alcohol also can be histamine releasing which can lead to symptoms of allergic reactions.  Wine can cause symptoms related to sulfites.  If you would like you can try sulfite free wines and see if you tolerate.   Also with alcohol you may tolerate pre-medication with primary antihistamine like Zyrtec with secondary antihistamine Pepcid to reduced histamine response.  Otherwise would avoid now.   - Have access to self-injectable epinephrine  (Epipen  0.3mg )  OR nasal epinephrine   (Neffy  2mg ) at all times  - Follow emergency action plan in case of allergic reaction  - We have discussed the following in regards to foods:   Allergy : food allergy  is when you have eaten a food, developed an allergic reaction after eating the food and have IgE to the food (positive food testing either by skin testing or blood testing).  Food allergy  could lead to life threatening symptoms  Sensitivity: occurs when you have IgE to a food (positive food testing either by skin testing or  blood testing) but is a food you eat without any issues.  This is not an allergy  and we recommend keeping the food in the diet  Intolerance: this is when you have negative testing by either skin testing or blood testing thus not allergic but the food causes symptoms (like belly pain, bloating, diarrhea etc) with ingestion.  These foods should be avoided to prevent symptoms.     - The oral allergy  syndrome (OAS) or pollen-food allergy  syndrome (PFAS) is a relatively common form of food allergy , particularly in adults. It typically occurs in people who have pollen allergies when the immune system sees proteins on the food that look like proteins on the pollen. This results in the allergy  antibody (IgE) binding to the food instead of the pollen. Patients typically report itching and/or mild swelling of the mouth and throat immediately following ingestion of certain uncooked fruits (including nuts) or raw vegetables. Only a very small number of affected individuals experience systemic allergic reactions, such as anaphylaxis which occurs with true food allergies.   This is not an all encompassing chart but helpful to see the possible associations.    Follow-up in 6 months or sooner if needed

## 2024-02-28 NOTE — Progress Notes (Signed)
 Follow-up Note  RE: SAPHIA VANDERFORD MRN: [id number] DOB: 1985/12/02 Date of Office Visit: 02/28/2024   History of present illness: LEOMIA BLAKE is a 38 y.o. female presenting today for follow-up of EOE and adverse food reaction.  She was last seen in the office on 08/30/23 by myself.  Discussed the use of AI scribe software for clinical note transcription with the patient, who gave verbal consent to proceed.  She is not experiencing any issues with eating or swallowing and has not introduced any new foods that have caused problems. She is not using any inhalers due to insurance issues but has been managing well without them. She reports that missing a dose of pantoprazole  results in significant GI discomfort until the next dose is taken. She has been using pantoprazole  consistently since her last visit and reports that it helps with her symptoms.  She has been healthy over the past six months, without any colds or illnesses, which she believes has helped her manage her eosinophilic esophagitis better.   She continues to avoid almonds and stove-top egg products due to previous positive allergy  tests. She inquires about the possibility of reintroducing these foods into her diet.   She is aware of the need for epinephrine  due to her food allergies and discusses the option of switching from an EpiPen  to a Neffy  nasal spray. She is due for a refill and prefers the nasal spray for its ease of use and stability.     Review of systems: 10pt ROS negative unless noted above in HPI   Past medical/social/surgical/family history have been reviewed and are unchanged unless specifically indicated below.  No changes  Medication List: Current Outpatient Medications  Medication Sig Dispense Refill   EPINEPHrine  (EPIPEN  2-PAK) 0.3 mg/0.3 mL IJ SOAJ injection Inject 0.3 mg into the muscle as needed for anaphylaxis. 2 each 0   Mometasone Furoate (ASMANEX HFA) 200 MCG/ACT AERO INHALE 2 PUFFS & SWALLOW  TWICE DAILY AS DIRECTED. DO NOT EAT, DRINK OR RINSE FOR 20 TO 30 MINUTES THEREAFTER. 39 g 0   pantoprazole  (PROTONIX ) 40 MG tablet TAKE 1 TABLET BY MOUTH TWICE A DAY 180 tablet 1   SYNTHROID  125 MCG tablet TAKE 1 TABLET BY MOUTH EVERY DAY 90 tablet 3   No current facility-administered medications for this visit.     Known medication allergies: Allergies  Allergen Reactions   Almond (Diagnostic)    Egg Protein-Containing Drug Products     Stove top egg   Tetracyclines & Related Other (See Comments)    Has had a Pseudotumor cerebri, was advised to list this medication as contraindicated      Physical examination: Blood pressure 112/76, pulse 80, resp. rate 16, SpO2 97%, unknown if currently breastfeeding.  General: Alert, interactive, in no acute distress. HEENT: PERRLA, TMs pearly gray, turbinates non-edematous without discharge, post-pharynx non erythematous. Neck: Supple without lymphadenopathy. Lungs: Clear to auscultation without wheezing, rhonchi or rales. {no increased work of breathing. CV: Normal S1, S2 without murmurs. Abdomen: Nondistended, nontender. Skin: Warm and dry, without lesions or rashes. Extremities:  No clubbing, cyanosis or edema. Neuro:   Grossly intact.  Diagnostics/Labs: None today  Assessment and plan: EOE Shala has concomitant perennial and seasonal allergy  sensitivity with grass pollen, mold and tobacco leaf.  Continue allergen avoidance measures.  - Continue Pantoprazole  twice a day.  - Will call pharmacy to determine what option you have for EOE management with swallowed steroids if symptoms were to arise again  -  We have discussed other treatment options including Dupixent weekly injections for EOE management  - Follow up with Dr. Charlanne as scheduled for monitoring (at least once every 3 years to maintain as an established patient)  Adverse food reaction  - Food allergy  testing was positive to almond and egg and alcohol  - Continue avoidance  of almond and stove-top egg in diet.   Will obtain serum IgE levels to these foods to see if eligible for in-office challenge to determine if safe to eat.  Banana and tomato may be intolerance.  Banana we can see with oral allergy  syndrome (see below) Alcohol we do not have any reliable test for.  Alcohol also can be histamine releasing which can lead to symptoms of allergic reactions.  Wine can cause symptoms related to sulfites.  If you would like you can try sulfite free wines and see if you tolerate.   Also with alcohol you may tolerate pre-medication with primary antihistamine like Zyrtec with secondary antihistamine Pepcid to reduced histamine response.  Otherwise would avoid now.   - Have access to self-injectable epinephrine  (Epipen  0.3mg )  OR nasal epinephrine   (Neffy  2mg ) at all times  - Follow emergency action plan in case of allergic reaction  - We have discussed the following in regards to foods:   Allergy : food allergy  is when you have eaten a food, developed an allergic reaction after eating the food and have IgE to the food (positive food testing either by skin testing or blood testing).  Food allergy  could lead to life threatening symptoms  Sensitivity: occurs when you have IgE to a food (positive food testing either by skin testing or blood testing) but is a food you eat without any issues.  This is not an allergy  and we recommend keeping the food in the diet  Intolerance: this is when you have negative testing by either skin testing or blood testing thus not allergic but the food causes symptoms (like belly pain, bloating, diarrhea etc) with ingestion.  These foods should be avoided to prevent symptoms.     - The oral allergy  syndrome (OAS) or pollen-food allergy  syndrome (PFAS) is a relatively common form of food allergy , particularly in adults. It typically occurs in people who have pollen allergies when the immune system sees proteins on the food that look like proteins on the  pollen. This results in the allergy  antibody (IgE) binding to the food instead of the pollen. Patients typically report itching and/or mild swelling of the mouth and throat immediately following ingestion of certain uncooked fruits (including nuts) or raw vegetables. Only a very small number of affected individuals experience systemic allergic reactions, such as anaphylaxis which occurs with true food allergies.   This is not an all encompassing chart but helpful to see the possible associations.  Follow-up in 6 months or sooner if needed   I appreciate the opportunity to take part in Carron's care. Please do not hesitate to contact me with questions.  Sincerely,   Danita Brain, MD Allergy /Immunology [facility]

## 2024-03-02 LAB — IGE NUT PROF. W/COMPONENT RFLX
F017-IgE Hazelnut (Filbert): <0.1 kU/L
F018-IgE Brazil Nut: <0.1 kU/L
F020-IgE Almond: <0.1 kU/L
F202-IgE Cashew Nut: <0.1 kU/L
F203-IgE Pistachio Nut: <0.1 kU/L
F256-IgE Walnut: <0.1 kU/L
Macadamia Nut, IgE: <0.1 kU/L
Peanut, IgE: <0.1 kU/L
Pecan Nut IgE: <0.1 kU/L

## 2024-03-02 LAB — ALLERGEN EGG WHITE F1: Egg White IgE: <0.1 kU/L

## 2024-03-02 LAB — IGE: IgE (Immunoglobulin E), Serum: 13 [IU]/mL (ref 6–495)

## 2024-03-06 ENCOUNTER — Telehealth: Payer: Self-pay

## 2024-03-06 NOTE — Telephone Encounter (Signed)
*  AA  Pharmacy Patient Advocate Encounter   Received notification from Fax that prior authorization for Neffy  is required/requested.   Insurance verification completed.   The patient is insured through [employer] [facility].   Per test claim:  Injectable Epi-Pen is preferred by the insurance.  If suggested medication is appropriate, Please send in a new RX and discontinue this one. If not, please advise as to why it's not appropriate so that we may request a Prior Authorization. Please note, some preferred medications may still require a PA.  If the suggested medications have not been trialed and there are no contraindications to their use, the PA will not be submitted, as it will not be approved.   Get Neffy  Now flyer sent to patient via Mychart for $199 option through [employer]

## 2024-03-09 ENCOUNTER — Ambulatory Visit: Payer: Self-pay | Admitting: Allergy

## 2024-03-12 ENCOUNTER — Telehealth: Payer: Self-pay | Admitting: Internal Medicine

## 2024-03-12 NOTE — Telephone Encounter (Signed)
 Lm and sent MyChart:  There has been a change in Dr Freda schedule for 2026. Your appointment needs to be rescheduled. Please call the office and ask for Darice or [employer]

## 2024-03-12 NOTE — Telephone Encounter (Signed)
 Patient called to schedule challenge.

## 2024-03-21 ENCOUNTER — Ambulatory Visit (HOSPITAL_BASED_OUTPATIENT_CLINIC_OR_DEPARTMENT_OTHER): Admit: 2024-03-21 | Discharge: 2024-03-21 | Disposition: A | Discharge status: Home / Self Care | Admitting: Radiology

## 2024-03-21 ENCOUNTER — Ambulatory Visit (HOSPITAL_BASED_OUTPATIENT_CLINIC_OR_DEPARTMENT_OTHER)
Admission: RE | Admit: 2024-03-21 | Discharge: 2024-03-21 | Disposition: A | Discharge status: Home / Self Care | Source: Ambulatory Visit

## 2024-03-21 ENCOUNTER — Encounter (HOSPITAL_BASED_OUTPATIENT_CLINIC_OR_DEPARTMENT_OTHER): Payer: Self-pay

## 2024-03-21 VITALS — BP 136/92 | HR 93 | Temp 99.1°F | Resp 20

## 2024-03-21 DIAGNOSIS — R509 Fever, unspecified: Secondary | ICD-10-CM | POA: Diagnosis not present

## 2024-03-21 DIAGNOSIS — R112 Nausea with vomiting, unspecified: Secondary | ICD-10-CM

## 2024-03-21 DIAGNOSIS — R051 Acute cough: Secondary | ICD-10-CM

## 2024-03-21 DIAGNOSIS — J101 Influenza due to other identified influenza virus with other respiratory manifestations: Secondary | ICD-10-CM

## 2024-03-21 DIAGNOSIS — R059 Cough, unspecified: Secondary | ICD-10-CM | POA: Diagnosis not present

## 2024-03-21 MED ORDER — ONDANSETRON 4 MG PO TBDP: 4.0000 mg | ORAL_TABLET | Freq: Three times a day (TID) | ORAL | 0 refills | Status: DC | PRN

## 2024-03-21 MED ORDER — PROMETHAZINE-DM 6.25-15 MG/5ML PO SYRP: 5.0000 mL | ORAL_SOLUTION | Freq: Four times a day (QID) | ORAL | 0 refills | Status: DC | PRN

## 2024-03-21 MED ORDER — ALBUTEROL SULFATE (2.5 MG/3ML) 0.083% IN NEBU: 2.5000 mg | INHALATION_SOLUTION | Freq: Four times a day (QID) | RESPIRATORY_TRACT | 0 refills | Status: AC | PRN

## 2024-03-21 MED ORDER — AIRSUPRA 90-80 MCG/ACT IN AERO: 2.0000 | INHALATION_SPRAY | RESPIRATORY_TRACT | 0 refills | Status: AC | PRN

## 2024-03-21 MED ORDER — OSELTAMIVIR PHOSPHATE 75 MG PO CAPS: 75.0000 mg | ORAL_CAPSULE | Freq: Two times a day (BID) | ORAL | 0 refills | Status: DC

## 2024-03-21 MED ORDER — IPRATROPIUM-ALBUTEROL 0.5-2.5 (3) MG/3ML IN SOLN
3.0000 mL | Freq: Once | RESPIRATORY_TRACT | Status: AC
  Administered 2024-03-21: 3 mL via RESPIRATORY_TRACT

## 2024-03-21 NOTE — ED Provider Notes (Signed)
 " [facility]    CSN: [id number] Arrival date & time: 03/21/24  1417      History   Chief Complaint Chief Complaint  Patient presents with   Cough    Entered by patient    HPI Anna Vasquez is a 38 y.o. female.   38 year old female with complaint of mild cough, mild nasal congestion that started on 03/16/2024.  She developed body aches and fever on 03/20/2024.  She believes she was exposed to the flu on approximately 03/17/2024 or later.  She tested positive for influenza type B on the morning of 03/21/2024.  She would like to start Tamiflu.  She is taking acetaminophen  for the body aches and fever.  The patient had pneumonia in the fall 2024 and she has eosinophilic esophagitis.  She feels like she is high risk to go from a cold to bronchitis and pneumonia.  She would like a chest x-ray if possible.   Cough Associated symptoms: chills, fever and rhinorrhea   Associated symptoms: no chest pain, no ear pain, no rash, no shortness of breath and no sore throat     Past Medical History:  Diagnosis Date   Eosinophilic esophagitis    Frequent headaches    GERD (gastroesophageal reflux disease)    H/O febrile seizure    Hx of migraines    Hypothyroidism    Hypothyroidism    PCOD (polycystic ovarian disease)    Pseudotumor cerebri    Thoracic outlet syndrome    extra cervical ribs   Vitamin D  deficiency     Patient Active Problem List   Diagnosis Date Noted   Allergies 06/25/2023   Plantar fasciitis 11/08/2022   Fatigue 10/25/2022   Low back pain 10/25/2022   Eosinophilic esophagitis 09/20/2022   Rectal bleeding 07/19/2022   Iron excess 10/25/2021   Rh negative, maternal / newborn Rh negative 02/05/2020   Encounter for induction of labor [facility] 02/04/2020   Indication for care in labor or delivery 02/04/2020   Postpartum care following vaginal delivery 11/15 02/04/2020   Severe obesity (BMI >= 40) (HCC) 02/04/2020   Anemia 02/12/2018   Diarrhea 05/07/2015    Health care maintenance 03/09/2015   Carpal tunnel syndrome, bilateral upper limbs 12/31/2014   Joint pain 03/19/2014   Pseudotumor cerebri 09/30/2012   GERD (gastroesophageal reflux disease) 09/30/2012   Hypothyroidism 09/30/2012   PCOD (polycystic ovarian disease) 09/30/2012    Past Surgical History:  Procedure Laterality Date   CARPAL TUNNEL RELEASE     rt and left   LAPAROSCOPIC GASTRIC SLEEVE RESECTION     MOUTH SURGERY  1998   UPPER GASTROINTESTINAL ENDOSCOPY      OB History     Gravida  2   Para  2   Term  2   Preterm      AB      Living  2      SAB      IAB      Ectopic      Multiple  0   Live Births  2            Home Medications    Prior to Admission medications  Medication Sig Start Date End Date Taking? Authorizing Provider  albuterol  (PROVENTIL ) (2.5 MG/3ML) 0.083% nebulizer solution Take 3 mLs (2.5 mg total) by nebulization every 6 (six) hours as needed for wheezing or shortness of breath. 03/21/24  Yes Ival Domino, FNP  Albuterol -Budesonide (AIRSUPRA) 90-80 MCG/ACT AERO Inhale 2 puffs  into the lungs every 4 (four) hours as needed (wheezing.  Rinse mouth after use). 03/21/24  Yes Ival Domino, FNP  ondansetron  (ZOFRAN -ODT) 4 MG disintegrating tablet Take 1 tablet (4 mg total) by mouth every 8 (eight) hours as needed for nausea or vomiting. 03/21/24  Yes Ival Domino, FNP  oseltamivir (TAMIFLU) 75 MG capsule Take 1 capsule (75 mg total) by mouth every 12 (twelve) hours. 03/21/24  Yes Ival Domino, FNP  promethazine -dextromethorphan (PROMETHAZINE -DM) 6.25-15 MG/5ML syrup Take 5 mLs by mouth 4 (four) times daily as needed for cough. Do not use and drive - May make drowsy. 03/21/24  Yes Ival Domino, FNP  EPINEPHrine  (NEFFY ) 2 MG/0.1ML SOLN Place 1 spray into the nose as needed. 02/28/24   Jeneal Danita Macintosh, MD  Mometasone Furoate (ASMANEX HFA) 200 MCG/ACT AERO INHALE 2 PUFFS & SWALLOW TWICE DAILY AS DIRECTED. DO NOT EAT, DRINK OR  RINSE FOR 20 TO 30 MINUTES THEREAFTER. 07/11/23   Jeneal Danita Macintosh, MD  pantoprazole  (PROTONIX ) 40 MG tablet TAKE 1 TABLET BY MOUTH TWICE A DAY 09/11/23   Glendia Shad, MD  SYNTHROID  125 MCG tablet TAKE 1 TABLET BY MOUTH EVERY DAY 12/14/23   Glendia Shad, MD    Family History Family History  Problem Relation Age of Onset   Rheum arthritis Mother    Arthritis Father    Hypertension Father    Sleep apnea Father    Transient ischemic attack Father    COPD Father    Alzheimer's disease Maternal Grandmother    Skin cancer Paternal Grandmother    Prostate cancer Paternal Grandfather    Breast cancer Paternal Aunt    Mental illness Paternal Uncle    Esophageal cancer Paternal Uncle    Colon cancer Neg Hx    Stomach cancer Neg Hx     Social History Social History[1]   Allergies   Almond (diagnostic), Egg protein-containing drug products, and Tetracyclines & related   Review of Systems Review of Systems  Constitutional:  Positive for chills and fever.  HENT:  Positive for congestion, postnasal drip and rhinorrhea. Negative for ear pain and sore throat.   Eyes:  Negative for pain and visual disturbance.  Respiratory:  Positive for cough. Negative for shortness of breath.   Cardiovascular:  Negative for chest pain and palpitations.  Gastrointestinal:  Negative for abdominal pain, constipation, diarrhea, nausea and vomiting.  Genitourinary:  Negative for dysuria and hematuria.  Musculoskeletal:  Positive for arthralgias. Negative for back pain.  Skin:  Negative for color change and rash.  Neurological:  Negative for seizures and syncope.  All other systems reviewed and are negative.    Physical Exam Triage Vital Signs ED Triage Vitals  Encounter Vitals Group     BP 03/21/24 1427 (!) 136/92     Girls Systolic BP Percentile --      Girls Diastolic BP Percentile --      Boys Systolic BP Percentile --      Boys Diastolic BP Percentile --      Pulse Rate 03/21/24  1427 93     Resp 03/21/24 1427 20     Temp 03/21/24 1427 99.1 F (37.3 C)     Temp Source 03/21/24 1427 Oral     SpO2 03/21/24 1427 97 %     Weight --      Height --      Head Circumference --      Peak Flow --      Pain Score 03/21/24 1429 5  Pain Loc --      Pain Education --      Exclude from Growth Chart --    No data found.  Updated Vital Signs BP (!) 136/92 (BP Location: Right Arm)   Pulse 93   Temp 99.1 F (37.3 C) (Oral)   Resp 20   LMP 03/05/2024   SpO2 97%   Visual Acuity Right Eye Distance:   Left Eye Distance:   Bilateral Distance:    Right Eye Near:   Left Eye Near:    Bilateral Near:     Physical Exam Vitals and nursing note reviewed.  Constitutional:      General: She is not in acute distress.    Appearance: She is well-developed. She is obese. She is ill-appearing. She is not toxic-appearing or diaphoretic.  HENT:     Head: Normocephalic and atraumatic.     Right Ear: Hearing, tympanic membrane, ear canal and external ear normal.     Left Ear: Hearing, tympanic membrane, ear canal and external ear normal.     Nose: Congestion and rhinorrhea present. Rhinorrhea is clear.     Right Sinus: Maxillary sinus tenderness and frontal sinus tenderness present.     Left Sinus: Maxillary sinus tenderness and frontal sinus tenderness present.     Comments: Sinuses are tender.    Mouth/Throat:     Lips: Pink.     Mouth: Mucous membranes are moist.     Pharynx: Uvula midline. No oropharyngeal exudate or posterior oropharyngeal erythema.     Tonsils: No tonsillar exudate.  Eyes:     Conjunctiva/sclera: Conjunctivae normal.     Pupils: Pupils are equal, round, and reactive to light.  Cardiovascular:     Rate and Rhythm: Normal rate and regular rhythm.     Heart sounds: S1 normal and S2 normal. No murmur heard. Pulmonary:     Effort: Pulmonary effort is normal. No respiratory distress.     Breath sounds: Examination of the right-upper field reveals  wheezing and rhonchi. Examination of the left-upper field reveals wheezing and rhonchi. Examination of the right-middle field reveals wheezing. Examination of the left-middle field reveals wheezing. Examination of the right-lower field reveals decreased breath sounds and wheezing. Examination of the left-lower field reveals decreased breath sounds and wheezing. Decreased breath sounds, wheezing (Inspiratory expiratory wheezes throughout.) and rhonchi present. No rales.     Comments: Oxygen saturation is 97% on room air.  Patient is having inspiratory and expiratory wheezes throughout with decreased breath sounds in the bases and some rhonchi in the upper lobes.  Reassessment after DuoNeb treatment: Oxygen saturation is 97-98 percent on room air.  Breath sounds improved after DuoNeb treatment. Abdominal:     General: Bowel sounds are normal.     Palpations: Abdomen is soft.     Tenderness: There is no abdominal tenderness.  Musculoskeletal:        General: No swelling.     Cervical back: Neck supple.  Lymphadenopathy:     Head:     Right side of head: No submental, submandibular, tonsillar, preauricular or posterior auricular adenopathy.     Left side of head: No submental, submandibular, tonsillar, preauricular or posterior auricular adenopathy.     Cervical: Cervical adenopathy present.     Right cervical: Superficial cervical adenopathy present.     Left cervical: Superficial cervical adenopathy present.  Skin:    General: Skin is warm and dry.     Capillary Refill: Capillary refill takes less than 2 seconds.  Findings: No rash.  Neurological:     Mental Status: She is alert and oriented to person, place, and time.  Psychiatric:        Mood and Affect: Mood normal.      UC Treatments / Results  Labs (all labs ordered are listed, but only abnormal results are displayed) Labs Reviewed - No data to display  EKG   Radiology No results found.  Procedures Procedures  (including critical care time)  Medications Ordered in UC Medications  ipratropium-albuterol  (DUONEB) 0.5-2.5 (3) MG/3ML nebulizer solution 3 mL (3 mLs Nebulization Given 03/21/24 1509)    Initial Impression / Assessment and Plan / UC Course  I have reviewed the triage vital signs and the nursing notes.  Pertinent labs & imaging results that were available during my care of the patient were reviewed by me and considered in my medical decision making (see chart for details).  Plan of Care (see discharge instructions for additional patient precautions and education): Influenza type B with fever, cough and nausea with vomiting: Patient had a home flu test that was positive for type B.  Tamiflu 75 mg twice daily for 5 days.  Get plenty of fluids and rest.  Promethazine  DM, 5 mL, every 6 hours if needed for cough.  Albuterol  nebulizer treatment, 1 vial every 4 hours as needed for coughing or wheezing.  Airsupra inhaler, 2 puffs, every 4 hours if needed for wheezing.  Ondansetron , 4 mg, melt on tongue, every 8 hours if needed for nausea or vomiting.  Follow-up if symptoms do not improve, worsen or new symptoms occur.  I reviewed the plan of care with the patient and/or the patient's guardian.  The patient and/or guardian had time to ask questions and acknowledged that the questions were answered.  Final Clinical Impressions(s) / UC Diagnoses   Final diagnoses:  Acute cough  Fever, unspecified  Influenza due to influenza virus, type B  Nausea and vomiting, unspecified vomiting type     Discharge Instructions      Influenza type B with fever, cough and nausea with vomiting: Patient had a home flu test that was positive for type B.  Tamiflu 75 mg twice daily for 5 days.  Get plenty of fluids and rest.  Promethazine  DM, 5 mL, every 6 hours if needed for cough.  Albuterol  nebulizer treatment, 1 vial every 4 hours as needed for coughing or wheezing.  Airsupra inhaler, 2 puffs, every 4 hours if  needed for wheezing.  Ondansetron , 4 mg, melt on tongue, every 8 hours if needed for nausea or vomiting.  Follow-up if symptoms do not improve, worsen or new symptoms occur.     ED Prescriptions     Medication Sig Dispense Auth. Provider   oseltamivir (TAMIFLU) 75 MG capsule Take 1 capsule (75 mg total) by mouth every 12 (twelve) hours. 10 capsule Ival Domino, FNP   promethazine -dextromethorphan (PROMETHAZINE -DM) 6.25-15 MG/5ML syrup Take 5 mLs by mouth 4 (four) times daily as needed for cough. Do not use and drive - May make drowsy. 118 mL Ival Domino, FNP   ondansetron  (ZOFRAN -ODT) 4 MG disintegrating tablet Take 1 tablet (4 mg total) by mouth every 8 (eight) hours as needed for nausea or vomiting. 20 tablet Kyheem Bathgate, FNP   Albuterol -Budesonide (AIRSUPRA) 90-80 MCG/ACT AERO Inhale 2 puffs into the lungs every 4 (four) hours as needed (wheezing.  Rinse mouth after use). 10.7 g Ival Domino, FNP   albuterol  (PROVENTIL ) (2.5 MG/3ML) 0.083% nebulizer solution Take 3 mLs (  2.5 mg total) by nebulization every 6 (six) hours as needed for wheezing or shortness of breath. 360 mL Ival Domino, FNP      PDMP not reviewed this encounter.    [1]  Social History Tobacco Use   Smoking status: Never   Smokeless tobacco: Never  Vaping Use   Vaping status: Never Used  Substance Use Topics   Alcohol use: Yes    Alcohol/week: 0.0 standard drinks of alcohol   Drug use: No     Ival Domino, FNP 03/21/24 1525  "

## 2024-03-21 NOTE — Telephone Encounter (Signed)
 Nothing further needed

## 2024-03-21 NOTE — Discharge Instructions (Addendum)
 Influenza type B with fever, cough and nausea with vomiting: Patient had a home flu test that was positive for type B.  Tamiflu 75 mg twice daily for 5 days.  Get plenty of fluids and rest.  Promethazine  DM, 5 mL, every 6 hours if needed for cough.  Albuterol  nebulizer treatment, 1 vial every 4 hours as needed for coughing or wheezing.  Airsupra inhaler, 2 puffs, every 4 hours if needed for wheezing.  Ondansetron , 4 mg, melt on tongue, every 8 hours if needed for nausea or vomiting.  Follow-up if symptoms do not improve, worsen or new symptoms occur.

## 2024-03-21 NOTE — ED Triage Notes (Signed)
 Onset of symptoms on 12/26. Dry Cough, nasal congestion. Cough is worsening. Fever onset last night. States rib cage/back hurts. Positive for Flu B this morning. Concerned about  pneumonia. Also requesting tamiflu. Took tylenol  at 1300.

## 2024-03-27 ENCOUNTER — Ambulatory Visit (INDEPENDENT_AMBULATORY_CARE_PROVIDER_SITE_OTHER): Admitting: Family Medicine

## 2024-03-27 ENCOUNTER — Encounter: Payer: Self-pay | Admitting: Family Medicine

## 2024-03-27 VITALS — BP 106/64 | HR 97 | Temp 98.1°F | Resp 16 | Wt 297.0 lb

## 2024-03-27 DIAGNOSIS — T78089D Anaphylactic reaction due to eggs, unspecified, subsequent encounter: Secondary | ICD-10-CM

## 2024-03-27 DIAGNOSIS — T78089A Anaphylactic reaction due to eggs, unspecified, initial encounter: Secondary | ICD-10-CM | POA: Insufficient documentation

## 2024-03-27 NOTE — Progress Notes (Signed)
 "  [address] [place] [address] Dept: [phone]  FOLLOW UP NOTE  Patient ID: Anna Vasquez, female    DOB: 1986/02/12  Age: 39 y.o. MRN: [id number] Date of Office Visit: 03/27/2024  Assessment  Chief Complaint: Food/Drug Challenge (Egg)  HPI Anna Vasquez is a 39 year old female who presents to the clinic for follow-up visit with possible oral food challenge to egg.  She was last seen in this clinic on 02/28/2024 by Dr. Jeneal for evaluation of EOE and food allergy  to almond and egg. Last food allergy  skin testing on 12/13/2022 was positive to egg.  Last food allergy  lab testing on 03/09/2024 was negative to egg.  At today's visit, she reports that she is feeling well overall with no cardiopulmonary, gastrointestinal, or integumentary symptoms.  She has not had any antihistamine over the last 3 days.  Of note, she has recently had influenza B and had her last dose of Tamiflu  yesterday.  She reports she has been afebrile for the last 3 days.  Her current medications are listed in the chart.   Drug Allergies:  Allergies[1]  Physical Exam: BP 106/64   Pulse 97   Temp 98.1 F (36.7 C) (Temporal)   Resp 16   Wt 297 lb (134.7 kg)   LMP 03/05/2024   SpO2 98%   BMI 47.94 kg/m    Physical Exam Vitals reviewed.  Constitutional:      Appearance: Normal appearance.  HENT:     Head: Normocephalic and atraumatic.     Right Ear: Tympanic membrane normal.     Left Ear: Tympanic membrane normal.     Nose: Nose normal.     Mouth/Throat:     Pharynx: Oropharynx is clear.  Eyes:     Conjunctiva/sclera: Conjunctivae normal.  Cardiovascular:     Rate and Rhythm: Normal rate and regular rhythm.     Heart sounds: Normal heart sounds. No murmur heard. Pulmonary:     Effort: Pulmonary effort is normal.     Breath sounds: Normal breath sounds.     Comments: Lungs clear to auscultation Musculoskeletal:        General: Normal range of motion.     Cervical back: Normal range  of motion and neck supple.  Skin:    General: Skin is warm and dry.  Neurological:     Mental Status: She is alert and oriented to person, place, and time.  Psychiatric:        Mood and Affect: Mood normal.        Behavior: Behavior normal.        Thought Content: Thought content normal.        Judgment: Judgment normal.    Procedure note:  Written consent obtained  Open graded scrambled egg oral challenge: The patient was able to tolerate the challenge today without adverse signs or symptoms. Vital signs were stable throughout the challenge and observation period. She received multiple doses separated by 15 minutes, each of which was separated by vitals and a brief physical exam. She received the following doses: lip rub, 1/16 egg, 1/8 egg, one quarter egg, 1/2+ remainder for total of 1 scrambled egg. She was monitored for 60 minutes following the last dose.  Total testing time: 137 minutes  The patient was able to tolerate the open graded oral challenge today without adverse signs or symptoms. Therefore, she has the same risk of systemic reaction associated with the consumption of scrambled egg as the general population.  Assessment and Plan: 1. 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   Patient Instructions  In office oral challenge to scrambled egg Anna Vasquez was able to tolerate the scrambled egg food challenge today at the office without adverse signs or symptoms of an allergic reaction. Therefore, she has the same risk of systemic reaction associated with the consumption of scrambled egg as the general population.  - Do not give any egg or products containing egg for the next 24 hours. - Monitor for allergic symptoms such as rash, wheezing, diarrhea, swelling, and vomiting for the next 24 hours. If severe symptoms occur, treat with EpiPen  injection and call 911. For less severe symptoms treat  with cetirizine 10 mg once every 12-24 hours and call the clinic.  - If no allergic symptoms are evident, reintroduce scrambled egg into the diet. If you do develops an allergic reaction to egg, record what was eaten the amount eaten, preparation method, time from ingestion to reaction, and symptoms.   Food allergy  Continue to avoid almond.  In case of an allergic reaction, give cetirizine 10 mg once every 12-24 hours, and if life-threatening symptoms occur, inject with EpiPen  0.3 mg. Return to the clinic for food challenge if you are interested.  Remember to stop antihistamines for 3 days before your food challenge appointment  Call the clinic if this treatment plan is not working well for you  Follow up in 3 months or sooner if needed.     Return in about 3 months (around 06/25/2024), or if symptoms worsen or fail to improve.    Thank you for the opportunity to care for this patient.  Please do not hesitate to contact me with questions.  Anna Mutter, FNP [facility]          [1]  Allergies Allergen Reactions   Almond (Diagnostic)    Tetracyclines & Related Other (See Comments)    Has had a Pseudotumor cerebri, was advised to list this medication as contraindicated    "

## 2024-03-27 NOTE — Patient Instructions (Addendum)
 In office oral challenge to scrambled egg Anna Vasquez was able to tolerate the scrambled egg food challenge today at the office without adverse signs or symptoms of an allergic reaction. Therefore, she has the same risk of systemic reaction associated with the consumption of scrambled egg as the general population.  - Do not give any egg or products containing egg for the next 24 hours. - Monitor for allergic symptoms such as rash, wheezing, diarrhea, swelling, and vomiting for the next 24 hours. If severe symptoms occur, treat with EpiPen  injection and call 911. For less severe symptoms treat with cetirizine 10 mg once every 12-24 hours and call the clinic.  - If no allergic symptoms are evident, reintroduce scrambled egg into the diet. If you do develops an allergic reaction to egg, record what was eaten the amount eaten, preparation method, time from ingestion to reaction, and symptoms.   Food allergy  Continue to avoid almond.  In case of an allergic reaction, give cetirizine 10 mg once every 12-24 hours, and if life-threatening symptoms occur, inject with EpiPen  0.3 mg. Return to the clinic for food challenge if you are interested.  Remember to stop antihistamines for 3 days before your food challenge appointment  Call the clinic if this treatment plan is not working well for you  Follow up in 3 months or sooner if needed.

## 2024-04-06 ENCOUNTER — Encounter: Admitting: Internal Medicine

## 2024-07-03 ENCOUNTER — Ambulatory Visit: Admitting: Allergy
